# Patient Record
Sex: Female | Born: 1955 | Race: White | Hispanic: No | Smoking: Former smoker
Health system: Southern US, Community
[De-identification: ages and names within clinical notes are randomized; demographics above are authoritative.]

## PROBLEM LIST (undated history)

## (undated) DIAGNOSIS — T7840XA Allergy, unspecified, initial encounter: Secondary | ICD-10-CM

## (undated) DIAGNOSIS — I10 Essential (primary) hypertension: Secondary | ICD-10-CM

## (undated) HISTORY — PX: DENTAL SURGERY: SHX609

## (undated) HISTORY — PX: TUBAL LIGATION: SHX77

## (undated) HISTORY — PX: ECTOPIC PREGNANCY SURGERY: SHX613

## (undated) HISTORY — DX: Essential (primary) hypertension: I10

## (undated) HISTORY — PX: TONSILLECTOMY AND ADENOIDECTOMY: SUR1326

## (undated) HISTORY — DX: Allergy, unspecified, initial encounter: T78.40XA

---
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
## 2011-08-24 ENCOUNTER — Encounter: Payer: Self-pay | Admitting: Family

## 2011-08-24 ENCOUNTER — Ambulatory Visit (INDEPENDENT_AMBULATORY_CARE_PROVIDER_SITE_OTHER): Payer: BC Managed Care – PPO | Admitting: Family

## 2011-08-24 VITALS — BP 150/96 | Ht 65.0 in | Wt 187.0 lb

## 2011-08-24 DIAGNOSIS — I10 Essential (primary) hypertension: Secondary | ICD-10-CM

## 2011-08-24 DIAGNOSIS — J309 Allergic rhinitis, unspecified: Secondary | ICD-10-CM

## 2011-08-24 DIAGNOSIS — G43909 Migraine, unspecified, not intractable, without status migrainosus: Secondary | ICD-10-CM

## 2011-08-24 MED ORDER — OLMESARTAN MEDOXOMIL-HCTZ 20-12.5 MG PO TABS: 1.0000 | ORAL_TABLET | Freq: Every day | ORAL | Status: DC

## 2011-08-24 MED ORDER — TOPIRAMATE 100 MG PO TABS: 100.0000 mg | ORAL_TABLET | Freq: Two times a day (BID) | ORAL | Status: DC

## 2011-08-24 NOTE — Progress Notes (Signed)
Subjective:    Patient ID: Peggy Church, female    DOB: 1955-11-04, 56 y.o.   MRN: [id number]  HPI Comments: 56 yo white female patient to the practice his Antabuse tablets. She has a history of hypertension, migraine headaches, and allergic rhinitis. Peggy Church stable on her current medications. She has not seen a primary care provider in about 2 years. Last complete physical exam, 2 years ago. She is overdue for her Pap smear as well. She denies any lightheadedness, dizziness, chest pain, palpitations, shortness of breath or edema.     Review of Systems  Constitutional: Negative.   HENT: Negative.   Eyes: Negative.   Respiratory: Negative.   Cardiovascular: Negative.   Gastrointestinal: Negative.   Genitourinary: Negative.   Musculoskeletal: Negative.   Skin: Negative.   Neurological: Negative.   Psychiatric/Behavioral: Negative.        Past Medical History  Diagnosis Date  . Hypertension   . Allergy   . Migraine     History   Social History  . Marital Status: Divorced    Spouse Name: N/A    Number of Children: N/A  . Years of Education: N/A   Occupational History  . Not on file.   Social History Main Topics  . Smoking status: Former Games developer  . Smokeless tobacco: Not on file  . Alcohol Use: Yes     moderately  . Drug Use: No  . Sexually Active: Not on file   Other Topics Concern  . Not on file   Social History Narrative  . No narrative on file    Past Surgical History  Procedure Date  . Tubal ligation   . Ectopic pregnancy surgery   . Tonsillectomy and adenoidectomy   . Bladder surgery 1963    stretched    Family History  Problem Relation Age of Onset  . Hyperlipidemia Mother   . Hypertension Mother   . Cancer Father     prostate  . Hyperlipidemia Father   . Hypertension Father     Allergies  Allergen Reactions  . Sulfa Drugs Cross Reactors     Current Outpatient Prescriptions on File Prior to Visit  Medication Sig Dispense Refill  .  fluticasone (FLONASE) 50 MCG/ACT nasal spray Place 2 sprays into the nose daily.      Marland Kitchen olmesartan-hydrochlorothiazide (BENICAR HCT) 20-12.5 MG per tablet Take 1 tablet by mouth daily.  30 tablet  5  . topiramate (TOPAMAX) 100 MG tablet Take 1 tablet (100 mg total) by mouth 2 (two) times daily.  60 tablet  5    BP 150/96  Ht 5\' 5"  (1.651 m)  Wt 187 lb (84.823 kg)  BMI 31.12 kg/m2chart Objective:   Physical Exam  Constitutional: She is oriented to person, place, and time. She appears well-developed and well-nourished. No distress.  HENT:  Right Ear: External ear normal.  Left Ear: External ear normal.  Mouth/Throat: Oropharynx is clear and moist. No oropharyngeal exudate.  Eyes: Right eye exhibits no discharge. Left eye exhibits no discharge.  Neck: Normal range of motion. No JVD present. No tracheal deviation present. No thyromegaly present.  Cardiovascular: Normal rate, regular rhythm, normal heart sounds and intact distal pulses.  Exam reveals no gallop and no friction rub.   No murmur heard. Pulmonary/Chest: Effort normal and breath sounds normal. No stridor. No respiratory distress. She has no wheezes. She has no rales. She exhibits no tenderness.  Abdominal: Soft. Bowel sounds are normal. She exhibits no distension and no mass. There  is no tenderness. There is no rebound and no guarding.  Musculoskeletal: Normal range of motion. She exhibits no edema and no tenderness.  Lymphadenopathy:    She has no cervical adenopathy.  Neurological: She is alert and oriented to person, place, and time.  Skin: Skin is warm and dry. She is not diaphoretic.  Psychiatric: She has a normal mood and affect.          Assessment & Plan:  Assessment: HTN, Migraine,  Allergic Rhinitis  Plan: Flonase, benicar, topamax renewed. RTC for CPX, pt has not been fasting. Teaching handouts on diagnosis and treatments provided. Healthy diet and exercise.

## 2011-08-30 ENCOUNTER — Ambulatory Visit (INDEPENDENT_AMBULATORY_CARE_PROVIDER_SITE_OTHER): Payer: BC Managed Care – PPO | Admitting: Family

## 2011-08-30 DIAGNOSIS — Z09 Encounter for follow-up examination after completed treatment for conditions other than malignant neoplasm: Secondary | ICD-10-CM

## 2011-08-30 NOTE — Progress Notes (Signed)
  Subjective:    Patient ID: Peggy Church, female    DOB: Mar 19, 1956, 56 y.o.   MRN: [id number]  HPI    Review of Systems     Objective:   Physical Exam        Assessment & Plan:

## 2011-08-31 ENCOUNTER — Encounter: Payer: Self-pay | Admitting: Family

## 2011-08-31 ENCOUNTER — Other Ambulatory Visit (HOSPITAL_COMMUNITY)
Admission: RE | Admit: 2011-08-31 | Discharge: 2011-08-31 | Disposition: A | Discharge status: Home / Self Care | Payer: BC Managed Care – PPO | Source: Ambulatory Visit | Attending: Family | Admitting: Family

## 2011-08-31 ENCOUNTER — Ambulatory Visit (INDEPENDENT_AMBULATORY_CARE_PROVIDER_SITE_OTHER): Payer: BC Managed Care – PPO | Admitting: Family

## 2011-08-31 VITALS — BP 124/88 | Temp 98.5°F | Wt 184.0 lb

## 2011-08-31 DIAGNOSIS — Z01419 Encounter for gynecological examination (general) (routine) without abnormal findings: Secondary | ICD-10-CM | POA: Insufficient documentation

## 2011-08-31 DIAGNOSIS — D126 Benign neoplasm of colon, unspecified: Secondary | ICD-10-CM

## 2011-08-31 DIAGNOSIS — K635 Polyp of colon: Secondary | ICD-10-CM

## 2011-08-31 DIAGNOSIS — I10 Essential (primary) hypertension: Secondary | ICD-10-CM

## 2011-08-31 DIAGNOSIS — Z1231 Encounter for screening mammogram for malignant neoplasm of breast: Secondary | ICD-10-CM

## 2011-08-31 DIAGNOSIS — Z124 Encounter for screening for malignant neoplasm of cervix: Secondary | ICD-10-CM

## 2011-08-31 DIAGNOSIS — Z0000 Encounter for general adult medical examination without abnormal findings: Secondary | ICD-10-CM

## 2011-08-31 LAB — LIPID PANEL
Cholesterol: 327 mg/dL — ABNORMAL HIGH (ref 0–200)
Triglycerides: 436 mg/dL — ABNORMAL HIGH (ref 0.0–149.0)

## 2011-08-31 LAB — BASIC METABOLIC PANEL
CO2: 24 mEq/L (ref 19–32)
Calcium: 9.8 mg/dL (ref 8.4–10.5)
Creatinine, Ser: 1 mg/dL (ref 0.4–1.2)
Glucose, Bld: 91 mg/dL (ref 70–99)

## 2011-08-31 LAB — LDL CHOLESTEROL, DIRECT: Direct LDL: 209.1 mg/dL

## 2011-08-31 LAB — CBC
MCHC: 34.2 g/dL (ref 30.0–36.0)
MCV: 88.2 fl (ref 78.0–100.0)

## 2011-08-31 NOTE — Progress Notes (Signed)
Subjective:    Patient ID: Peggy Church, female    DOB: 1955-07-26, 56 y.o.   MRN: [id number]  HPI Comments: Single 56 yo white female presents for CPX. Stating was started on Benicar one week ago for HTN. Bp 124/88. Asymptomatic, denies cp or shortness of breath. Nonsmoker. Denies ETOH use. Is not sexually active. Does not exercise. Has been fasting for labs. Mammogram 5 years ago for biopsy which was negative. Had colonoscopy 6 years ago for colon polyps. Denies any problems with bowels, abdominal discomfort, or GERD. Has experienced bladder incontinence since age 26. Denies urinary frequency, urgency, dysuria, nausea, or urine odor. C/o 'mild" migraine headache started couple days ago, rates as 4/10 described as throbbing. Took "topiramate" with no relief. Denies headache pain increasing with movement. Mild photophobia.      Review of Systems  Constitutional: Negative.   Eyes: Negative.   Respiratory: Negative.   Cardiovascular: Negative.   Gastrointestinal: Negative.   Genitourinary: Negative.   Musculoskeletal: Negative.   Skin: Negative.   Neurological: Positive for headaches. Negative for dizziness, tremors, seizures, syncope, facial asymmetry, speech difficulty, weakness, light-headedness and numbness.  Hematological: Negative.   Psychiatric/Behavioral: Negative.    Past Medical History  Diagnosis Date  . Hypertension   . Allergy   . Migraine     History   Social History  . Marital Status: Divorced    Spouse Name: N/A    Number of Children: N/A  . Years of Education: N/A   Occupational History  . Not on file.   Social History Main Topics  . Smoking status: Former Games developer  . Smokeless tobacco: Not on file  . Alcohol Use: Yes     moderately  . Drug Use: No  . Sexually Active: Not on file   Other Topics Concern  . Not on file   Social History Narrative  . No narrative on file    Past Surgical History  Procedure Date  . Tubal ligation   . Ectopic  pregnancy surgery   . Tonsillectomy and adenoidectomy   . Bladder surgery 1963    stretched    Family History  Problem Relation Age of Onset  . Hyperlipidemia Mother   . Hypertension Mother   . Cancer Father     prostate  . Hyperlipidemia Father   . Hypertension Father     Allergies  Allergen Reactions  . Sulfa Drugs Cross Reactors     Current Outpatient Prescriptions on File Prior to Visit  Medication Sig Dispense Refill  . fluticasone (FLONASE) 50 MCG/ACT nasal spray Place 2 sprays into the nose daily.      Marland Kitchen olmesartan-hydrochlorothiazide (BENICAR HCT) 20-12.5 MG per tablet Take 1 tablet by mouth daily.  30 tablet  5  . topiramate (TOPAMAX) 100 MG tablet Take 1 tablet (100 mg total) by mouth 2 (two) times daily.  60 tablet  5    BP 124/88  Temp(Src) 98.5 F (36.9 C) (Oral)  Wt 184 lb (83.462 kg)chart    Objective:   Physical Exam  Constitutional: She is oriented to person, place, and time. She appears well-developed and well-nourished. No distress.  HENT:  Left Ear: External ear normal.  Nose: Nose normal.  Mouth/Throat: Oropharynx is clear and moist.  Eyes: Pupils are equal, round, and reactive to light. Right eye exhibits no discharge. Left eye exhibits no discharge.  Neck: Normal range of motion. Neck supple. No JVD present. No tracheal deviation present. No thyromegaly present.  Cardiovascular: Normal rate, regular rhythm,  normal heart sounds and intact distal pulses.  Exam reveals no gallop and no friction rub.   No murmur heard. Pulmonary/Chest: Effort normal and breath sounds normal. No stridor. No respiratory distress. She has no wheezes. She has no rales. She exhibits no tenderness.  Lymphadenopathy:    She has no cervical adenopathy.  Neurological: She is alert and oriented to person, place, and time.  Skin: Skin is warm and dry. No rash noted. She is not diaphoretic. No erythema. No pallor.  Psychiatric: She has a normal mood and affect. Her behavior is  normal.          Assessment & Plan:  Assessment: Migraine, CPE, Pap Smear  Plan: Labs: CBC, BMP, TSH. RTC as needed. Teaching handouts provided on diagnosis and treatments. Opportunity for questions provided.Mammogram. Colonoscopy.

## 2011-08-31 NOTE — Patient Instructions (Signed)
Breast Self-Exam A self breast exam may help you find changes or problems while they are still small. Do a breast self-exam:  Every month.   One week after your period (menstrual period).   On the first day of each month if you do not have periods anymore.  Look for any:  Change in breast color, size, or shape.   Dimples in your breast.   Changes in your nipples or skin.   Dry skin on your breasts or nipples.   Watery or bloody discharge from your nipples.   Feel for:  Lumps.   Thick, hard places.   Any other changes.  HOME CARE There are 3 ways to do the breast self-exam: In front of a mirror.  Lift your arms over your head and turn side to side.   Put your hands on your hips and lean down, then turn from side to side.   Bend forward and turn from side to side.  In the shower.  With soapy hands, check both breasts. Then check above and below your collarbone and your armpits.   Feel above and below your collarbone down to under your breast, and from the center of your chest to the outer edge of the armpit. Check for any lumps or hard spots.   Using the tips of your middle three fingers check your whole breast by pressing your hand over your breast in a circle or in an up and down motion.  Lying down.  Lie flat on your bed.   Put a small pillow under the breast you are going to check. On that same side, put your hand behind your head.   With your other hand, use the 3 middle fingers to feel the breast.   Move your fingers in a circle around the breast. Press firmly over all parts of the breast to feel for any lumps.  GET HELP RIGHT AWAY IF: You find any changes in your breasts so they can be checked. Document Released: 10/12/2007 Document Revised: 04/14/2011 Document Reviewed: 08/13/2008 Exit[employer] Patient Information 2012 [employer].   Exercise to Stay Healthy Exercise helps you become and stay healthy. EXERCISE IDEAS AND TIPS Choose exercises  that:  You enjoy.   Fit into your day.  You do not need to exercise really hard to be healthy. You can do exercises at a slow or medium level and stay healthy. You can:  Stretch before and after working out.   Try yoga, Pilates, or tai chi.   Lift weights.   Walk fast, swim, jog, run, climb stairs, bicycle, dance, or rollerskate.   Take aerobic classes.  Exercises that burn about 150 calories:  Running 1  miles in 15 minutes.   Playing volleyball for 45 to 60 minutes.   Washing and waxing a car for 45 to 60 minutes.   Playing touch football for 45 minutes.   Walking 1  miles in 35 minutes.   Pushing a stroller 1  miles in 30 minutes.   Playing basketball for 30 minutes.   Raking leaves for 30 minutes.   Bicycling 5 miles in 30 minutes.   Walking 2 miles in 30 minutes.   Dancing for 30 minutes.   Shoveling snow for 15 minutes.   Swimming laps for 20 minutes.   Walking up stairs for 15 minutes.   Bicycling 4 miles in 15 minutes.   Gardening for 30 to 45 minutes.   Jumping rope for 15 minutes.   Washing windows   or floors for 45 to 60 minutes.  Document Released: 05/28/2010 Document Revised: 04/14/2011 Document Reviewed: 05/28/2010 [employer] Patient Information 2012 [employer]. 

## 2011-09-01 ENCOUNTER — Telehealth: Payer: Self-pay | Admitting: Family

## 2011-09-01 MED ORDER — CHOLINE FENOFIBRATE 135 MG PO CPDR: 135.0000 mg | DELAYED_RELEASE_CAPSULE | Freq: Every day | ORAL | Status: DC

## 2011-09-01 NOTE — Telephone Encounter (Signed)
Message copied by Beverely Low on Thu Sep 01, 2011  1:57 PM ------      Message from: Peggy Church      Created: Wed Aug 31, 2011  4:10 PM       Be sure patient was fasting... If so, her triglycerides are significantly elevated and will require medication.

## 2011-09-01 NOTE — Telephone Encounter (Signed)
Pt called req to get lab results. Pls call asap.  °

## 2011-09-01 NOTE — Telephone Encounter (Signed)
Pt aware and states that she know about the elevated cholesterol and trigs. She does not want to take a statin, stating that she has taken a few different types in the past and they have destroyed her muscles. Pt advises that she has changed her diet and tries exercise but her cholesterol remains high.  Per Oran Rein, medication for triglycerides does not cause muscle aches and pains but if pt still refuses medication she is at risk for pancreatitis and other complications.     Pt aware and states that she will try the medication. Rx sent and Results mailed to pt

## 2011-09-12 ENCOUNTER — Encounter: Payer: Self-pay | Admitting: Family

## 2011-09-12 ENCOUNTER — Ambulatory Visit (INDEPENDENT_AMBULATORY_CARE_PROVIDER_SITE_OTHER): Payer: BC Managed Care – PPO | Admitting: Family

## 2011-09-12 VITALS — BP 122/80 | Temp 98.4°F | Wt 184.0 lb

## 2011-09-12 DIAGNOSIS — I10 Essential (primary) hypertension: Secondary | ICD-10-CM

## 2011-09-12 DIAGNOSIS — T7840XA Allergy, unspecified, initial encounter: Secondary | ICD-10-CM

## 2011-09-12 DIAGNOSIS — Z888 Allergy status to other drugs, medicaments and biological substances status: Secondary | ICD-10-CM

## 2011-09-12 MED ORDER — OLMESARTAN MEDOXOMIL 20 MG PO TABS: 20.0000 mg | ORAL_TABLET | Freq: Every day | ORAL | Status: DC

## 2011-09-12 NOTE — Progress Notes (Signed)
Subjective:    Patient ID: Peggy Church, female    DOB: [birth year], 56 y.o.   MRN: [id number]  HPI 56 year old white female, nonsmoker is in today with complaints of itchiness inside of her mouth, no takes x3 weeks. She believes it may be a side effect of her Benicar HCT. She's been on Benicar HCT for about 3 weeks. She has previously been on lisinopril which caused a cough. She's also tried Norvasc and is unsure of what her reaction was. She denies any lightheadedness, dizziness, chest pain, palpitations, shortness of breath or edema.   Review of Systems  Constitutional: Negative.   HENT: Positive for sore throat.        No taste  Respiratory: Negative.   Cardiovascular: Negative.   Musculoskeletal: Negative.   Skin: Negative.   Neurological: Negative.   Hematological: Negative.   Psychiatric/Behavioral: Negative.    Past Medical History  Diagnosis Date  . Hypertension   . Allergy   . Migraine     History   Social History  . Marital Status: Divorced    Spouse Name: N/A    Number of Children: N/A  . Years of Education: N/A   Occupational History  . Not on file.   Social History Main Topics  . Smoking status: Former Games developer  . Smokeless tobacco: Not on file  . Alcohol Use: Yes     moderately  . Drug Use: No  . Sexually Active: Not on file   Other Topics Concern  . Not on file   Social History Narrative  . No narrative on file    Past Surgical History  Procedure Date  . Tubal ligation   . Ectopic pregnancy surgery   . Tonsillectomy and adenoidectomy   . Bladder surgery 1963    stretched    Family History  Problem Relation Age of Onset  . Hyperlipidemia Mother   . Hypertension Mother   . Cancer Father     prostate  . Hyperlipidemia Father   . Hypertension Father     Allergies  Allergen Reactions  . Sulfa Drugs Cross Reactors     Current Outpatient Prescriptions on File Prior to Visit  Medication Sig Dispense Refill  . Choline Fenofibrate  (TRILIPIX) 135 MG capsule Take 1 capsule (135 mg total) by mouth daily.  30 capsule  1  . fluticasone (FLONASE) 50 MCG/ACT nasal spray Place 2 sprays into the nose daily.      Marland Kitchen olmesartan-hydrochlorothiazide (BENICAR HCT) 20-12.5 MG per tablet Take 1 tablet by mouth daily.  30 tablet  5  . topiramate (TOPAMAX) 100 MG tablet Take 1 tablet (100 mg total) by mouth 2 (two) times daily.  60 tablet  5    BP 122/80  Temp(Src) 98.4 F (36.9 C) (Oral)  Wt 184 lb (83.462 kg)chart    Objective:   Physical Exam  Constitutional: She is oriented to person, place, and time. She appears well-developed and well-nourished.  HENT:  Right Ear: External ear normal.  Left Ear: External ear normal.  Nose: Nose normal.  Mouth/Throat: Oropharynx is clear and moist.  Cardiovascular: Normal rate, regular rhythm and normal heart sounds.   Pulmonary/Chest: Effort normal and breath sounds normal.  Neurological: She is alert and oriented to person, place, and time.  Skin: Skin is warm and dry.  Psychiatric: She has a normal mood and affect.          Assessment & Plan:  Assessment: Hypertension, allergic reaction to medication  Plan: Since she has  a sulfa allergy, DC Benicar HCT. And start Benicar 20 mg once a day. I believe she is allergic to the HCTZ. Will recheck patient in 2 weeks. Call the office if symptoms worsen or persist. Recheck as scheduled and when necessary sooner.

## 2012-01-17 ENCOUNTER — Ambulatory Visit (INDEPENDENT_AMBULATORY_CARE_PROVIDER_SITE_OTHER): Payer: BC Managed Care – PPO | Admitting: Family Medicine

## 2012-01-17 VITALS — BP 145/87 | HR 65 | Temp 98.5°F | Resp 16 | Ht 65.0 in | Wt 185.0 lb

## 2012-01-17 DIAGNOSIS — Z025 Encounter for examination for participation in sport: Secondary | ICD-10-CM

## 2012-01-17 DIAGNOSIS — Z0289 Encounter for other administrative examinations: Secondary | ICD-10-CM

## 2012-01-17 NOTE — Progress Notes (Signed)
Patient ID: Peggy Church, female   DOB: 1955/11/13, 56 y.o.   MRN: [id number] Peggy Church is a 56 y.o. female who presents to Urgent Care today for work physical:  1.  Work Physical:  No significant past medical history.  Does have history of recurrent UTI's but none recently.  Doing well currently.    PMH reviewed.  ROS as above otherwise neg.  No chest pain, palpitations, SOB, Fever, Chills, Abd pain, N/V/D.  Medications reviewed. Current Outpatient Prescriptions  Medication Sig Dispense Refill  . topiramate (TOPAMAX) 100 MG tablet Take 100 mg by mouth as needed.      Marland Kitchen DISCONTD: topiramate (TOPAMAX) 100 MG tablet Take 1 tablet (100 mg total) by mouth 2 (two) times daily.  60 tablet  5    Exam:  BP 145/87  Pulse 65  Temp 98.5 F (36.9 C)  Resp 16  Ht 5\' 5"  (1.651 m)  Wt 185 lb (83.915 kg)  BMI 30.79 kg/m2 Gen: Well NAD HEENT:  [place]/AT.  EOMI, PERRL.  MMM, tonsils non-erythematous, non-edematous.  External ears WNL, Bilateral TM's normal without retraction, redness or bulging.  Pulm:  Clear to auscultation bilaterally with good air movement.  No wheezes or rales noted.   Cardiac:  Regular rate and rhythm without murmur auscultated.  Good S1/S2. Abd: NABS, NT, ND Exts: Non edematous BL  LE, warm and well perfused.  Neuro:  Alert and oriented x 3.    Assessment and Plan:  1.  Sports physical:  Normal appearing female with normal physical examination.  Vision and hearing good.  PPD negative July 17 2011.  Signed form and given to patient.

## 2012-02-11 NOTE — Progress Notes (Signed)
°  Subjective:    Patient ID: Peggy Church, female    DOB: 1956-03-31, 56 y.o.   MRN: [id number]  HPI    Review of Systems     Objective:   Physical Exam        Assessment & Plan:  appt cancelled

## 2012-05-28 ENCOUNTER — Ambulatory Visit (INDEPENDENT_AMBULATORY_CARE_PROVIDER_SITE_OTHER): Payer: BC Managed Care – PPO | Admitting: Family Medicine

## 2012-05-28 VITALS — BP 170/102 | HR 70 | Temp 98.2°F | Resp 16 | Ht 65.0 in | Wt 184.0 lb

## 2012-05-28 DIAGNOSIS — H539 Unspecified visual disturbance: Secondary | ICD-10-CM

## 2012-05-28 DIAGNOSIS — R51 Headache: Secondary | ICD-10-CM

## 2012-05-28 DIAGNOSIS — I10 Essential (primary) hypertension: Secondary | ICD-10-CM

## 2012-05-28 LAB — BASIC METABOLIC PANEL WITH GFR
BUN: 14 mg/dL (ref 6–23)
Calcium: 9.9 mg/dL (ref 8.4–10.5)
Glucose, Bld: 93 mg/dL (ref 70–99)
Sodium: 139 meq/L (ref 135–145)

## 2012-05-28 LAB — BASIC METABOLIC PANEL
CO2: 22 mEq/L (ref 19–32)
Chloride: 107 mEq/L (ref 96–112)
Creat: 1.04 mg/dL (ref 0.50–1.10)
Potassium: 4.1 mEq/L (ref 3.5–5.3)

## 2012-05-28 MED ORDER — AMLODIPINE BESYLATE 5 MG PO TABS: 5.0000 mg | ORAL_TABLET | Freq: Every day | ORAL | Status: DC

## 2012-05-28 MED ORDER — HYDROCHLOROTHIAZIDE 12.5 MG PO CAPS: 12.5000 mg | ORAL_CAPSULE | Freq: Every day | ORAL | Status: DC

## 2012-05-28 NOTE — Progress Notes (Signed)
[facility]:  Office Visit  Chief Complaint:  Chief Complaint  Patient presents with  . Hypertension    high blood pressure x 2 week  . Headache    HPI: Peggy Church is a 57 y.o. female who complains of 2 week h/o elevated BP. Was on Benicar but had SEs ie wierd smells. Can't ake Linsopril due to cough. Lost 80 lbs during messy divorce and personal death, but has gained 25 lbs hence the BP has gone up. Nonsmoker. She has diffuse frontal HA but denies gait changes, confusion, slurred speech or Cp/SOB, palpitations, nausea, vomiting. She is not sure if she has decrease vision, denies double vision  Past Medical History  Diagnosis Date  . Hypertension   . Allergy   . Migraine    Past Surgical History  Procedure Date  . Tubal ligation   . Ectopic pregnancy surgery   . Tonsillectomy and adenoidectomy   . Bladder surgery 1963    stretched   History   Social History  . Marital Status: Divorced    Spouse Name: N/A    Number of Children: N/A  . Years of Education: N/A   Social History Main Topics  . Smoking status: Former Games developer  . Smokeless tobacco: None  . Alcohol Use: Yes     Comment: moderately  . Drug Use: No  . Sexually Active: None   Other Topics Concern  . None   Social History Narrative  . None   Family History  Problem Relation Age of Onset  . Hyperlipidemia Mother   . Hypertension Mother   . Cancer Father     prostate  . Hyperlipidemia Father   . Hypertension Father    Allergies  Allergen Reactions  . Lisinopril     cough  . Sulfa Drugs Cross Reactors    Prior to Admission medications   Medication Sig Start Date End Date Taking? Authorizing Provider  topiramate (TOPAMAX) 100 MG tablet Take 100 mg by mouth as needed. 08/24/11  Yes Baker Pierini, FNP     ROS: The patient denies fevers, chills, night sweats, unintentional weight loss, chest pain, palpitations, wheezing, dyspnea on exertion, nausea, vomiting, abdominal  pain, dysuria, hematuria, melena, numbness, weakness, or tingling.   All other systems have been reviewed and were otherwise negative with the exception of those mentioned in the HPI and as above.    PHYSICAL EXAM: Filed Vitals:   05/28/12 1553  BP: 170/102  Pulse: 70  Temp: 98.2 F (36.8 C)  Resp: 16   Filed Vitals:   05/28/12 1553  Height: 5\' 5"  (1.651 m)  Weight: 184 lb (83.462 kg)   Body mass index is 30.62 kg/(m^2).  General: Alert, no acute distress HEENT:  Normocephalic, atraumatic, oropharynx patent. EOMI, PERRLA, fundoscopic exam nl Cardiovascular:  Regular rate and rhythm, no rubs murmurs or gallops.  No Carotid bruits, radial pulse intact. No pedal edema.  Respiratory: Clear to auscultation bilaterally.  No wheezes, rales, or rhonchi.  No cyanosis, no use of accessory musculature GI: No organomegaly, abdomen is soft and non-tender, positive bowel sounds.  No masses. Skin: No rashes. Neurologic: Facial musculature symmetric. Psychiatric: Patient is appropriate throughout our interaction. Lymphatic: No cervical lymphadenopathy Musculoskeletal: Gait intact.   LABS:    EKG/XRAY:   Primary read interpreted by Dr. Conley Rolls at [facility].   ASSESSMENT/PLAN: Encounter Diagnoses  Name Primary?  . HTN (hypertension) Yes  . Headache    Rx Norvasc 5 mg and HCTZ 12.5  mg daily Will increase prn in 2 weeks, patient will call me with BP results and we will adjust accordingly IF BP greater than 200/100 after taking Norvasc and HCTZ she will call me in the AM. Call me in AM if HA still continues Will f/u with BP logs in 4 weeks or sooner prn DASH diet, BMP pending    LE, THAO PHUONG, DO 05/28/2012 4:21 PM

## 2012-05-28 NOTE — Patient Instructions (Addendum)
DASH Diet The DASH diet stands for "Dietary Approaches to Stop Hypertension." It is a healthy eating plan that has been shown to reduce high blood pressure (hypertension) in as little as 14 days, while also possibly providing other significant health benefits. These other health benefits include reducing the risk of breast cancer after menopause and reducing the risk of type 2 diabetes, heart disease, colon cancer, and stroke. Health benefits also include weight loss and slowing kidney failure in patients with chronic kidney disease.  DIET GUIDELINES  Limit salt (sodium). Your diet should contain less than 1500 mg of sodium daily.  Limit refined or processed carbohydrates. Your diet should include mostly whole grains. Desserts and added sugars should be used sparingly.  Include small amounts of heart-healthy fats. These types of fats include nuts, oils, and tub margarine. Limit saturated and trans fats. These fats have been shown to be harmful in the body. CHOOSING FOODS  The following food groups are based on a 2000 calorie diet. See your Registered Dietitian for individual calorie needs. Grains and Grain Products (6 to 8 servings daily)  Eat More Often: Whole-wheat bread, brown rice, whole-grain or wheat pasta, quinoa, popcorn without added fat or salt (air popped).  Eat Less Often: White bread, white pasta, white rice, cornbread. Vegetables (4 to 5 servings daily)  Eat More Often: Fresh, frozen, and canned vegetables. Vegetables may be raw, steamed, roasted, or grilled with a minimal amount of fat.  Eat Less Often/Avoid: Creamed or fried vegetables. Vegetables in a cheese sauce. Fruit (4 to 5 servings daily)  Eat More Often: All fresh, canned (in natural juice), or frozen fruits. Dried fruits without added sugar. One hundred percent fruit juice ( cup [237 mL] daily).  Eat Less Often: Dried fruits with added sugar. Canned fruit in light or heavy syrup. [employer], Fish, and Poultry (2  servings or less daily. One serving is 3 to 4 oz [85-114 g]).  Eat More Often: Ninety percent or leaner ground beef, tenderloin, sirloin. Round cuts of beef, chicken breast, [place] breast. All fish. Grill, bake, or broil your meat. Nothing should be fried.  Eat Less Often/Avoid: Fatty cuts of meat, [place], or chicken leg, thigh, or wing. Fried cuts of meat or fish. Dairy (2 to 3 servings)  Eat More Often: Low-fat or fat-free milk, low-fat plain or light yogurt, reduced-fat or part-skim cheese.  Eat Less Often/Avoid: Milk (whole, 2%).Whole milk yogurt. Full-fat cheeses. Nuts, Seeds, and Legumes (4 to 5 servings per week)  Eat More Often: All without added salt.  Eat Less Often/Avoid: Salted nuts and seeds, canned beans with added salt. Fats and Sweets (limited)  Eat More Often: Vegetable oils, tub margarines without trans fats, sugar-free gelatin. Mayonnaise and salad dressings.  Eat Less Often/Avoid: Coconut oils, palm oils, butter, stick margarine, cream, half and half, cookies, candy, pie. FOR MORE INFORMATION The Dash Diet Eating Plan: www.dashdiet.org Document Released: 04/14/2011 Document Revised: 07/18/2011 Document Reviewed: 04/14/2011 [facility] Patient Information 2013 [place], [place]. Hypertension As your heart beats, it forces blood through your arteries. This force is your blood pressure. If the pressure is too high, it is called hypertension (HTN) or high blood pressure. HTN is dangerous because you may have it and not know it. High blood pressure may mean that your heart has to work harder to pump blood. Your arteries may be narrow or stiff. The extra work puts you at risk for heart disease, stroke, and other problems.  Blood pressure consists of two numbers, a  higher number over a lower, 110/72, for example. It is stated as "110 over 72." The ideal is below 120 for the top number (systolic) and under 80 for the bottom (diastolic). Write down your blood pressure today. You  should pay close attention to your blood pressure if you have certain conditions such as:  Heart failure.  Prior heart attack.  Diabetes  Chronic kidney disease.  Prior stroke.  Multiple risk factors for heart disease. To see if you have HTN, your blood pressure should be measured while you are seated with your arm held at the level of the heart. It should be measured at least twice. A one-time elevated blood pressure reading (especially in the Emergency Department) does not mean that you need treatment. There may be conditions in which the blood pressure is different between your right and left arms. It is important to see your caregiver soon for a recheck. Most people have essential hypertension which means that there is not a specific cause. This type of high blood pressure may be lowered by changing lifestyle factors such as:  Stress.  Smoking.  Lack of exercise.  Excessive weight.  Drug/tobacco/alcohol use.  Eating less salt. Most people do not have symptoms from high blood pressure until it has caused damage to the body. Effective treatment can often prevent, delay or reduce that damage. TREATMENT  When a cause has been identified, treatment for high blood pressure is directed at the cause. There are a large number of medications to treat HTN. These fall into several categories, and your caregiver will help you select the medicines that are best for you. Medications may have side effects. You should review side effects with your caregiver. If your blood pressure stays high after you have made lifestyle changes or started on medicines,   Your medication(s) may need to be changed.  Other problems may need to be addressed.  Be certain you understand your prescriptions, and know how and when to take your medicine.  Be sure to follow up with your caregiver within the time frame advised (usually within two weeks) to have your blood pressure rechecked and to review your  medications.  If you are taking more than one medicine to lower your blood pressure, make sure you know how and at what times they should be taken. Taking two medicines at the same time can result in blood pressure that is too low. SEEK IMMEDIATE MEDICAL CARE IF:  You develop a severe headache, blurred or changing vision, or confusion.  You have unusual weakness or numbness, or a faint feeling.  You have severe chest or abdominal pain, vomiting, or breathing problems. MAKE SURE YOU:   Understand these instructions.  Will watch your condition.  Will get help right away if you are not doing well or get worse. Document Released: 04/25/2005 Document Revised: 07/18/2011 Document Reviewed: 12/14/2007 [facility] Patient Information 2013 [place], [place]. DASH Diet The DASH diet stands for "Dietary Approaches to Stop Hypertension." It is a healthy eating plan that has been shown to reduce high blood pressure (hypertension) in as little as 14 days, while also possibly providing other significant health benefits. These other health benefits include reducing the risk of breast cancer after menopause and reducing the risk of type 2 diabetes, heart disease, colon cancer, and stroke. Health benefits also include weight loss and slowing kidney failure in patients with chronic kidney disease.  DIET GUIDELINES  Limit salt (sodium). Your diet should contain less than 1500 mg of sodium daily.  Limit refined or processed carbohydrates. Your diet should include mostly whole grains. Desserts and added sugars should be used sparingly.  Include small amounts of heart-healthy fats. These types of fats include nuts, oils, and tub margarine. Limit saturated and trans fats. These fats have been shown to be harmful in the body. CHOOSING FOODS  The following food groups are based on a 2000 calorie diet. See your Registered Dietitian for individual calorie needs. Grains and Grain Products (6 to 8 servings daily)  Eat  More Often: Whole-wheat bread, brown rice, whole-grain or wheat pasta, quinoa, popcorn without added fat or salt (air popped).  Eat Less Often: White bread, white pasta, white rice, cornbread. Vegetables (4 to 5 servings daily)  Eat More Often: Fresh, frozen, and canned vegetables. Vegetables may be raw, steamed, roasted, or grilled with a minimal amount of fat.  Eat Less Often/Avoid: Creamed or fried vegetables. Vegetables in a cheese sauce. Fruit (4 to 5 servings daily)  Eat More Often: All fresh, canned (in natural juice), or frozen fruits. Dried fruits without added sugar. One hundred percent fruit juice ( cup [237 mL] daily).  Eat Less Often: Dried fruits with added sugar. Canned fruit in light or heavy syrup. [employer], Fish, and Poultry (2 servings or less daily. One serving is 3 to 4 oz [85-114 g]).  Eat More Often: Ninety percent or leaner ground beef, tenderloin, sirloin. Round cuts of beef, chicken breast, [place] breast. All fish. Grill, bake, or broil your meat. Nothing should be fried.  Eat Less Often/Avoid: Fatty cuts of meat, [place], or chicken leg, thigh, or wing. Fried cuts of meat or fish. Dairy (2 to 3 servings)  Eat More Often: Low-fat or fat-free milk, low-fat plain or light yogurt, reduced-fat or part-skim cheese.  Eat Less Often/Avoid: Milk (whole, 2%).Whole milk yogurt. Full-fat cheeses. Nuts, Seeds, and Legumes (4 to 5 servings per week)  Eat More Often: All without added salt.  Eat Less Often/Avoid: Salted nuts and seeds, canned beans with added salt. Fats and Sweets (limited)  Eat More Often: Vegetable oils, tub margarines without trans fats, sugar-free gelatin. Mayonnaise and salad dressings.  Eat Less Often/Avoid: Coconut oils, palm oils, butter, stick margarine, cream, half and half, cookies, candy, pie. FOR MORE INFORMATION The Dash Diet Eating Plan: www.dashdiet.org Document Released: 04/14/2011 Document Revised: 07/18/2011 Document Reviewed:  04/14/2011 [facility] Patient Information 2013 [place], [place].

## 2012-05-30 ENCOUNTER — Telehealth: Payer: Self-pay | Admitting: Family Medicine

## 2012-05-30 NOTE — Telephone Encounter (Signed)
Message copied by Honor Loh on Wed May 30, 2012  9:36 AM ------      Message from: Lenell Antu      Created: Tue May 29, 2012  2:15 PM       Call her to let her know kidneys and electrolytes ok. Can you ask her what her BP is as well after starting meds.             Thx             Dr. Conley Rolls

## 2012-05-30 NOTE — Telephone Encounter (Signed)
Left message on machine for patient to return call

## 2012-06-26 ENCOUNTER — Ambulatory Visit (INDEPENDENT_AMBULATORY_CARE_PROVIDER_SITE_OTHER): Payer: BC Managed Care – PPO | Admitting: Family Medicine

## 2012-06-26 VITALS — BP 130/80 | HR 69 | Temp 98.1°F | Resp 14 | Ht 68.0 in | Wt 186.0 lb

## 2012-06-26 DIAGNOSIS — I10 Essential (primary) hypertension: Secondary | ICD-10-CM | POA: Insufficient documentation

## 2012-06-26 MED ORDER — AMLODIPINE BESYLATE 5 MG PO TABS: 5.0000 mg | ORAL_TABLET | Freq: Every day | ORAL | Status: DC

## 2012-06-26 MED ORDER — HYDROCHLOROTHIAZIDE 12.5 MG PO CAPS: 12.5000 mg | ORAL_CAPSULE | Freq: Every day | ORAL | Status: DC

## 2012-06-26 NOTE — Progress Notes (Signed)
[facility]:  Office Visit  Chief Complaint:  Chief Complaint  Patient presents with  . Hypertension    Follow up    HPI: Peggy Church is a 57 y.o. female who complains of  HTN. Doing well. No SEs. Her Has and vision issues have resolved now that her BP is controlled. She does not take her BP at home,  Past Medical History  Diagnosis Date  . Hypertension   . Allergy   . Migraine    Past Surgical History  Procedure Laterality Date  . Tubal ligation    . Ectopic pregnancy surgery    . Tonsillectomy and adenoidectomy    . Bladder surgery  1963    stretched   History   Social History  . Marital Status: Divorced    Spouse Name: N/A    Number of Children: N/A  . Years of Education: N/A   Social History Main Topics  . Smoking status: Former Games developer  . Smokeless tobacco: None  . Alcohol Use: Yes     Comment: moderately  . Drug Use: No  . Sexually Active: None   Other Topics Concern  . None   Social History Narrative  . None   Family History  Problem Relation Age of Onset  . Hyperlipidemia Mother   . Hypertension Mother   . Cancer Father     prostate  . Hyperlipidemia Father   . Hypertension Father    Allergies  Allergen Reactions  . Lisinopril     cough  . Sulfa Drugs Cross Reactors    Prior to Admission medications   Medication Sig Start Date End Date Taking? Authorizing Provider  amLODipine (NORVASC) 5 MG tablet Take 1 tablet (5 mg total) by mouth daily. 05/28/12  Yes Burel Kahre P Martine Bleecker, DO  hydrochlorothiazide (MICROZIDE) 12.5 MG capsule Take 1 capsule (12.5 mg total) by mouth daily. 05/28/12  Yes Jacobs Golab P Tashawnda Bleiler, DO  topiramate (TOPAMAX) 100 MG tablet Take 100 mg by mouth as needed. 08/24/11  Yes Baker Pierini, FNP     ROS: The patient denies fevers, chills, night sweats, unintentional weight loss, chest pain, palpitations, wheezing, dyspnea on exertion, nausea, vomiting, abdominal pain, dysuria, hematuria, melena, numbness, weakness, or  tingling.   All other systems have been reviewed and were otherwise negative with the exception of those mentioned in the HPI and as above.    PHYSICAL EXAM: Filed Vitals:   06/26/12 1946  BP: 130/80  Pulse: 69  Temp: 98.1 F (36.7 C)  Resp: 14   Filed Vitals:   06/26/12 1946  Height: 5\' 8"  (1.727 m)  Weight: 186 lb (84.369 kg)   Body mass index is 28.29 kg/(m^2).  General: Alert, no acute distress HEENT:  Normocephalic, atraumatic, oropharynx patent.  Cardiovascular:  Regular rate and rhythm, no rubs murmurs or gallops.  No Carotid bruits, radial pulse intact. No pedal edema.  Respiratory: Clear to auscultation bilaterally.  No wheezes, rales, or rhonchi.  No cyanosis, no use of accessory musculature GI: No organomegaly, abdomen is soft and non-tender, positive bowel sounds.  No masses. Skin: No rashes. Neurologic: Facial musculature symmetric. Psychiatric: Patient is appropriate throughout our interaction. Lymphatic: No cervical lymphadenopathy Musculoskeletal: Gait intact.   LABS: Results for orders placed in visit on 05/28/12  BASIC METABOLIC PANEL      Result Value Range   Sodium 139  135 - 145 mEq/L   Potassium 4.1  3.5 - 5.3 mEq/L   Chloride 107  96 -  112 mEq/L   CO2 22  19 - 32 mEq/L   Glucose, Bld 93  70 - 99 mg/dL   BUN 14  6 - 23 mg/dL   Creat 1.47  8.29 - 5.62 mg/dL   Calcium 9.9  8.4 - 13.0 mg/dL     EKG/XRAY:   Primary read interpreted by Dr. Conley Rolls at [facility].   ASSESSMENT/PLAN: Encounter Diagnosis  Name Primary?  . HTN (hypertension) Yes   HTN well controlled.  Refilled meds for 6 months F/u in 6 months or prn    Kincaid Tiger PHUONG, DO 06/26/2012 8:38 PM

## 2012-10-17 ENCOUNTER — Ambulatory Visit (INDEPENDENT_AMBULATORY_CARE_PROVIDER_SITE_OTHER): Payer: BC Managed Care – PPO | Admitting: Physician Assistant

## 2012-10-17 VITALS — BP 171/94 | HR 69 | Temp 98.0°F | Resp 16 | Ht 66.0 in | Wt 184.0 lb

## 2012-10-17 DIAGNOSIS — IMO0001 Reserved for inherently not codable concepts without codable children: Secondary | ICD-10-CM

## 2012-10-17 DIAGNOSIS — N9489 Other specified conditions associated with female genital organs and menstrual cycle: Secondary | ICD-10-CM

## 2012-10-17 DIAGNOSIS — N949 Unspecified condition associated with female genital organs and menstrual cycle: Secondary | ICD-10-CM

## 2012-10-17 DIAGNOSIS — I10 Essential (primary) hypertension: Secondary | ICD-10-CM

## 2012-10-17 DIAGNOSIS — R350 Frequency of micturition: Secondary | ICD-10-CM

## 2012-10-17 LAB — POCT URINALYSIS DIPSTICK
Bilirubin, UA: NEGATIVE
Blood, UA: NEGATIVE
Glucose, UA: NEGATIVE
Ketones, UA: NEGATIVE
Leukocytes, UA: NEGATIVE
Nitrite, UA: NEGATIVE
Protein, UA: NEGATIVE
Spec Grav, UA: 1.01
Urobilinogen, UA: 0.2
pH, UA: 5

## 2012-10-17 LAB — POCT UA - MICROSCOPIC ONLY
Bacteria, U Microscopic: NEGATIVE
Casts, Ur, LPF, POC: NEGATIVE
Crystals, Ur, HPF, POC: NEGATIVE
Mucus, UA: NEGATIVE
WBC, Ur, HPF, POC: NEGATIVE
Yeast, UA: NEGATIVE

## 2012-10-17 LAB — POCT WET PREP WITH KOH
Trichomonas, UA: NEGATIVE
Yeast Wet Prep HPF POC: NEGATIVE

## 2012-10-17 MED ORDER — CHLORTHALIDONE 25 MG PO TABS: 25.0000 mg | ORAL_TABLET | Freq: Every day | ORAL | Status: DC

## 2012-10-17 MED ORDER — PHENAZOPYRIDINE HCL 200 MG PO TABS: 200.0000 mg | ORAL_TABLET | Freq: Three times a day (TID) | ORAL | Status: DC | PRN

## 2012-10-17 NOTE — Progress Notes (Signed)
I have examined this patient along with the student and agree.  

## 2012-10-17 NOTE — Patient Instructions (Addendum)
Continue to stay hydrated with at least 64 fl oz per day.  Take medications as directed.  If symptoms worsen or persist please return for follow up.   I will contact you with your lab results as soon as they are available.   If you have not heard from me in 2 weeks, please contact me.  The fastest way to get your results is to register for My Chart (see the instructions on the last page of this printout).

## 2012-10-17 NOTE — Progress Notes (Signed)
  Subjective:    Patient ID: Peggy Church, female    DOB: July 19, 1955, 57 y.o.   MRN: [id number]  HPI  Presents with urinary frequency, urgency and vaginal burning x 1 month. Approximately 1 month ago she was seen at another clinic for UTI and was placed on 7 days of Cipro and an unknown yeast treatment (which she did not use). She felt better by the time the Cipro was done for a few days. Since then she has had a gradual worsening of symptoms. The burning sensation improvement after she used an OTC 1 day yeast treatment. Denies: hematuria, nausea, vomiting, fever, chills.   Additionally states she has not been taking her amlodipine as directed because she feels it makes her gain weight. She reports doing well with the HCTZ.   Review of Systems Denies: headache, dizziness, chest pain, SOB, peripheral edema.     Objective:   Physical Exam  General: WDWN female, appears stated age, no acute distress Resp: clear to auscultation anterior and posterior fields bilaterally, no rales/rhonchi/wheezes  Cardiac: RRR, no murmurs/rubs/gallops  Abdomen: normal bowel sounds, soft, non distended, tender to palpation only in suprapubic region, no CVA tenderness Pelvic: no rashes or lesions, cervix appears normal, mild amount of white discharge, no cervical motion tenderness Neuro: alert & oriented x 3  Skin: no rashes   Results for orders placed in visit on 10/17/12  POCT UA - MICROSCOPIC ONLY      Result Value Range   WBC, Ur, HPF, POC neg     RBC, urine, microscopic 1-2     Bacteria, U Microscopic neg     Mucus, UA neg     Epithelial cells, urine per micros 0-1     Crystals, Ur, HPF, POC neg     Casts, Ur, LPF, POC neg     Yeast, UA neg    POCT URINALYSIS DIPSTICK      Result Value Range   Color, UA yellow     Clarity, UA clear     Glucose, UA neg     Bilirubin, UA neg     Ketones, UA neg     Spec Grav, UA 1.010     Blood, UA neg     pH, UA 5.0     Protein, UA neg     Urobilinogen, UA  0.2     Nitrite, UA neg     Leukocytes, UA Negative    POCT WET PREP WITH KOH      Result Value Range   Trichomonas, UA Negative     Clue Cells Wet Prep HPF POC 0-3     Epithelial Wet Prep HPF POC 2-10     Yeast Wet Prep HPF POC neg     Bacteria Wet Prep HPF POC 4+     RBC Wet Prep HPF POC 0-1     WBC Wet Prep HPF POC 1-2     KOH Prep POC Negative         Assessment & Plan:  Frequency - Plan: POCT UA - Microscopic Only, POCT urinalysis dipstick, Urine culture  Vaginal burning - Plan: POCT Wet Prep with KOH, GC/Chlamydia Probe Amp  Etiology unclear suspect recent UTI s/p treatment and resultant yeast vaginitis also treated.   BP medication: allergy to lisinopril - changed to chlorthalidone, d/c amlodipine d/c HCTZ  Begin pyridium for urinary symptoms  Waiting lab results of gen probe and urine culture

## 2012-10-18 LAB — GC/CHLAMYDIA PROBE AMP: CT Probe RNA: NEGATIVE

## 2012-10-19 LAB — URINE CULTURE: Organism ID, Bacteria: NO GROWTH

## 2012-10-20 ENCOUNTER — Telehealth: Payer: Self-pay

## 2012-10-20 NOTE — Telephone Encounter (Signed)
Pt is returning a call Call back number [phone]

## 2012-11-12 ENCOUNTER — Ambulatory Visit: Payer: BC Managed Care – PPO | Admitting: Family Medicine

## 2012-11-12 VITALS — BP 120/88 | HR 68 | Temp 98.0°F | Resp 16 | Ht 65.5 in | Wt 179.0 lb

## 2012-11-12 DIAGNOSIS — Z0289 Encounter for other administrative examinations: Secondary | ICD-10-CM

## 2012-11-12 NOTE — Progress Notes (Signed)
Commercial Driver Medical Examination   Peggy Church is a 57 y.o. female who presents today for a commercial driver fitness determination physical exam. The patient reports no problems. The following portions of the patient's history were reviewed and updated as appropriate: allergies, current medications, past family history, past medical history, past social history, past surgical history and problem list.  Has well-controlled HTN - only medical problem. She used to be on topamax for migraines but hasn't needed it in a long time.  Review of Systems A comprehensive review of systems was negative.   Objective:    Vision:  Uncorrected Corrected Horizontal Field of Vision  Right Eye 20/70 20/25 70 degrees  Left Eye  20/70 20/25 70 degrees  Both Eyes  20/70 20/25    Applicant can recognize and distinguish among traffic control signals and devices showing standard red, green, and amber colors.  Applicant meets visual acuity requirement only when wearing corrective lenses.  Monocular Vision?: No   Hearing:   500 Hz 1000 Hz 2000 Hz 4000 Hz  Right Ear  NA NA NA NA  Left Ear  NA NA NA NA  Passed whisper test bilaterally at 15 ft.     BP 120/88  Pulse 68  Temp(Src) 98 F (36.7 C)  Resp 16  Ht 5' 5.5" (1.664 m)  Wt 179 lb (81.194 kg)  BMI 29.32 kg/m2  SpO2 100%  General Appearance:    Alert, cooperative, no distress, appears stated age  Head:    Normocephalic, without obvious abnormality, atraumatic  Eyes:    PERRL, conjunctiva/corneas clear, EOM's intact, fundi    benign, both eyes  Ears:    Normal TM's and external ear canals, both ears  Nose:   Nares normal, septum midline, mucosa normal, no drainage    or sinus tenderness  Throat:   Lips, mucosa, and tongue normal; teeth and gums normal  Neck:   Supple, symmetrical, trachea midline, no adenopathy;    thyroid:  no enlargement/tenderness/nodules; no carotid   bruit or JVD  Back:     Symmetric, no curvature, ROM normal,  no CVA tenderness  Lungs:     Clear to auscultation bilaterally, respirations unlabored  Chest Wall:    No tenderness or deformity   Heart:    Regular rate and rhythm, S1 and S2 normal, no murmur, rub   or gallop  Breast Exam:    No tenderness, masses, or nipple abnormality  Abdomen:     Soft, non-tender, bowel sounds active all four quadrants,    no masses, no organomegaly        Extremities:   Extremities normal, atraumatic, no cyanosis or edema  Pulses:   2+ and symmetric all extremities  Skin:   Skin color, texture, turgor normal, no rashes or lesions  Lymph nodes:   Cervical, supraclavicular, and axillary nodes normal  Neurologic:   CNII-XII intact, normal strength, sensation and reflexes    throughout    Labs: Lab Results  Component Value Date   SPECGRAV 1.010 10/17/2012   BILIRUBINUR neg 10/17/2012      Assessment:    Healthy female exam.  Meets standards, but periodic monitoring required due to HTN.  Driver qualified only for 1 year.    Plan:    Medical examiners certificate completed and printed. Return as needed.

## 2012-11-12 NOTE — Progress Notes (Signed)
  Subjective:    Patient ID: Peggy Church, female    DOB: May 18, 1955, 57 y.o.   MRN: [id number]  HPI    Review of Systems     Objective:   Physical Exam        Assessment & Plan:   Urine dip: Sp gravity-1.020 Blood-negative Glucose-negative Protein-negative

## 2012-11-22 ENCOUNTER — Encounter: Payer: Self-pay | Admitting: Family Medicine

## 2013-01-08 ENCOUNTER — Ambulatory Visit (INDEPENDENT_AMBULATORY_CARE_PROVIDER_SITE_OTHER): Payer: BC Managed Care – PPO | Admitting: Family Medicine

## 2013-01-08 VITALS — BP 118/70 | HR 74 | Temp 99.0°F | Resp 18 | Ht 65.0 in | Wt 180.2 lb

## 2013-01-08 DIAGNOSIS — I10 Essential (primary) hypertension: Secondary | ICD-10-CM

## 2013-01-08 MED ORDER — HYDROCHLOROTHIAZIDE 25 MG PO TABS: 12.5000 mg | ORAL_TABLET | Freq: Every day | ORAL | Status: DC

## 2013-01-08 NOTE — Patient Instructions (Signed)
Would be unlikely for HCTZ to cause swelling.  This was likely due to amlodipine in the past. Restart HCTZ at 1/2 pill each day. Keep a record of your blood pressures outside of the office and bring them to the next office visit. If pressures remain above 140/90- can increase to 1 full pill each day. You are overdue for electrolytes/labs, so when you can verify insurance coverage, let me know and I can put in a lab only order for you to have his done. Return to the clinic or go to the nearest emergency room if any of your symptoms worsen or new symptoms occur. Plan on follow up in the next month.

## 2013-01-08 NOTE — Progress Notes (Signed)
  Subjective:    Patient ID: Peggy Church, female    DOB: 1955/07/23, 57 y.o.   MRN: [id number]  HPI  Peggy Church is a 57 y.o. female  HTN  - on chlorthalidone 25mg  qd since June 11th. Causing dry mouth. Tried to quit taking chlorthalidone about 2 weeks ago due to dry mouth, HA and blurry vision at times off meds - blood pressure increased? But did not check blood pressure then, Took blood pressure med every few days.  Feels ok today, but took HCTZ few times earlier this week. Took chlorthalidone yesterday.  Feels well today.   Cough with ACE-I per chart. Phantom smells with Benicar.  Swelling of legs with HCTZ? But was taking norvasc at the same time, and swelling improved   Difficult initial history - clarified timing of medicines a few times.   Review of Systems As above. Feels well now, no chest pain, headache, dizziness or blurry vision.     Objective:   Physical Exam  Vitals reviewed. Constitutional: She is oriented to person, place, and time. She appears well-developed and well-nourished.  HENT:  Head: Normocephalic and atraumatic.  Eyes: Conjunctivae and EOM are normal. Pupils are equal, round, and reactive to light.  Neck: Carotid bruit is not present.  Cardiovascular: Normal rate, regular rhythm, normal heart sounds and intact distal pulses.   Pulmonary/Chest: Effort normal and breath sounds normal.  Abdominal: Soft. She exhibits no pulsatile midline mass. There is no tenderness.  Neurological: She is alert and oriented to person, place, and time.  Skin: Skin is warm and dry.  Psychiatric: She has a normal mood and affect. Her behavior is normal.   No appreciable LE edema noted on exam.      Assessment & Plan:  Peggy Church is a 57 y.o. female HTN (hypertension) - Plan: hydrochlorothiazide (HYDRODIURIL) 25 MG tablet  Prior intolerance or allergy to various medicines, but doubt HCTZ causing edema, as more likely CCB effect.  Will restart HCTZ at 12.5mg  QD, then  can increase to 25mg  each day. Avoid salt containing foods, and rtc precautions discussed or call if persistent edema returns.    Discussed need for BMP.  Patient declined in office - wants to check insurance coverage first.  Can do lab only order when she finds this info, but if any cramps, or new symptoms - needs electrolyte levels.  Plan to recheck in next month.    Meds ordered this encounter  Medications  . hydrochlorothiazide (HYDRODIURIL) 25 MG tablet    Sig: Take 0.5 tablets (12.5 mg total) by mouth daily.    Dispense:  30 tablet    Refill:  0   Patient Instructions  Would be unlikely for HCTZ to cause swelling.  This was likely due to amlodipine in the past. Restart HCTZ at 1/2 pill each day. Keep a record of your blood pressures outside of the office and bring them to the next office visit. If pressures remain above 140/90- can increase to 1 full pill each day. You are overdue for electrolytes/labs, so when you can verify insurance coverage, let me know and I can put in a lab only order for you to have his done. Return to the clinic or go to the nearest emergency room if any of your symptoms worsen or new symptoms occur. Plan on follow up in the next month.

## 2013-02-13 ENCOUNTER — Ambulatory Visit (INDEPENDENT_AMBULATORY_CARE_PROVIDER_SITE_OTHER): Payer: BC Managed Care – PPO | Admitting: Family Medicine

## 2013-02-13 VITALS — BP 142/86 | HR 73 | Temp 98.7°F | Resp 16 | Ht 65.0 in | Wt 182.0 lb

## 2013-02-13 DIAGNOSIS — I10 Essential (primary) hypertension: Secondary | ICD-10-CM

## 2013-02-13 DIAGNOSIS — N899 Noninflammatory disorder of vagina, unspecified: Secondary | ICD-10-CM

## 2013-02-13 DIAGNOSIS — N898 Other specified noninflammatory disorders of vagina: Secondary | ICD-10-CM

## 2013-02-13 MED ORDER — ACYCLOVIR 200 MG PO CAPS: 200.0000 mg | ORAL_CAPSULE | Freq: Two times a day (BID) | ORAL | Status: DC

## 2013-02-13 MED ORDER — AMLODIPINE BESYLATE 5 MG PO TABS: 5.0000 mg | ORAL_TABLET | Freq: Every day | ORAL | Status: DC

## 2013-02-13 NOTE — Patient Instructions (Addendum)
Thank you for coming in today  We will stop HCTZ and restart amlodipine mg daily Try to get to a healthier weight and exercise for 30 min per day Return in 1 month for BP check  For vaginal irritation, try vaginal moisturizer. If this is not helpful, we can discuss vaginal estrogen at next appointment.   Menopause Menopause is the normal time of life when menstrual periods stop completely. Menopause is complete when you have missed 12 consecutive menstrual periods. It usually occurs between the ages of 47 to 26, with an average age of 15. Very rarely does a woman develop menopause before 56 years old. At menopause, your ovaries stop producing the female hormones, estrogen and progesterone. This can cause undesirable symptoms and also affect your health. Sometimes the symptoms may occur 4 to 5 years before the menopause begins. There is no relationship between menopause and:  Oral contraceptives.  Number of children you had.  Race.  The age your menstrual periods started (menarche). Heavy smokers and very thin women may develop menopause earlier in life. CAUSES  The ovaries stop producing the female hormones estrogen and progesterone.  Other causes include:  Surgery to remove both ovaries.  The ovaries stop functioning for no known reason.  Tumors of the pituitary gland in the brain.  Medical disease that affects the ovaries and hormone production.  Radiation treatment to the abdomen or pelvis.  Chemotherapy that affects the ovaries. SYMPTOMS   Hot flashes.  Night sweats.  Decrease in sex drive.  Vaginal dryness and thinning of the vagina causing painful intercourse.  Dryness of the skin and developing wrinkles.  Headaches.  Tiredness.  Irritability.  Memory problems.  Weight gain.  Bladder infections.  Hair growth of the face and chest.  Infertility. More serious symptoms include:  Loss of bone (osteoporosis) causing breaks  (fractures).  Depression.  Hardening and narrowing of the arteries (atherosclerosis) causing heart attacks and strokes. DIAGNOSIS   When the menstrual periods have stopped for 12 straight months.  Physical exam.  Hormone studies of the blood. TREATMENT  There are many treatment choices and nearly as many questions about them. The decisions to treat or not to treat menopausal changes is an individual choice made with your caregiver. Your caregiver can discuss the treatments with you. Together, you can decide which treatment will work best for you. Your treatment choices may include:   Hormone therapy (estorgen and progesterone).  Non-hormonal medications.  Treating the individual symptoms with medication (for example antidepressants for depression).  Herbal medications that may help specific symptoms.  Counseling by a psychiatrist or psychologist.  Group therapy.  Lifestyle changes including:  Eating healthy.  Regular exercise.  Limiting caffeine and alcohol.  Stress management and meditation.  No treatment. HOME CARE INSTRUCTIONS   Take the medication your caregiver gives you as directed.  Get plenty of sleep and rest.  Exercise regularly.  Eat a diet that contains calcium (good for the bones) and soy products (acts like estrogen hormone).  Avoid alcoholic beverages.  Do not smoke.  If you have hot flashes, dress in layers.  Take supplements, calcium and vitamin D to strengthen bones.  You can use over-the-counter lubricants or moisturizers for vaginal dryness.  Group therapy is sometimes very helpful.  Acupuncture may be helpful in some cases. SEEK MEDICAL CARE IF:   You are not sure you are in menopause.  You are having menopausal symptoms and need advice and treatment.  You are still having menstrual periods  after age 58.  You have pain with intercourse.  Menopause is complete (no menstrual period for 12 months) and you develop vaginal  bleeding.  You need a referral to a specialist (gynecologist, psychiatrist or psychologist) for treatment. SEEK IMMEDIATE MEDICAL CARE IF:   You have severe depression.  You have excessive vaginal bleeding.  You fell and think you have a broken bone.  You have pain when you urinate.  You develop leg or chest pain.  You have a fast pounding heart beat (palpitations).  You have severe headaches.  You develop vision problems.  You feel a lump in your breast.  You have abdominal pain or severe indigestion. Document Released: 07/16/2003 Document Revised: 07/18/2011 Document Reviewed: 02/21/2008 [facility] Patient Information 2014 [place], [place].

## 2013-02-13 NOTE — Progress Notes (Signed)
  Subjective:    Patient ID: Peggy Church, female    DOB: Sep 29, 1955, 57 y.o.   MRN: [id number]  HPI Patient presents with complaint of dry mouth from HCTZ. Has been on this medication for one month. Did not take the medication today. Does not check BP at home. Lisinopril caused cough. Amlodipine caused swelling of ankles.  Patient also complains of vaginal dryness. Symptoms are off and on, worst with intercourse. Started having worsening a few months ago. LMP greater than 5 years. Does occasionally get hot flashes, etc. Has tried KY jelly and astroglide. No vaginal discharge or itching.    Review of Systems  All other systems reviewed and are negative.      Objective:   Physical Exam  Constitutional: She is oriented to person, place, and time. She appears well-developed and well-nourished. No distress.  HENT:  Head: Normocephalic and atraumatic.  Eyes: Conjunctivae are normal. No scleral icterus.  Neck: Neck supple.  Cardiovascular: Normal rate and regular rhythm.   Pulmonary/Chest: Effort normal and breath sounds normal.  Neurological: She is alert and oriented to person, place, and time.  Skin: Skin is warm and dry.  Psychiatric: She has a normal mood and affect. Her behavior is normal.      Assessment & Plan:  #1. HTN - Will stop HCTZ - Start amlodipine 5 mg daily - Return 1 month for BP check  #2. Post-menopausal vaginal dryness - Patient will do trial of vaginal moisturizer OTC - Recommended that she read about vaginal estrogen pros and cons - F/u next month, consider vaginal estrogen if no improvement in symptoms.  #3. H/o genital herpes - Refilled acyclovir - No outbreak in many years.

## 2013-05-19 ENCOUNTER — Ambulatory Visit (INDEPENDENT_AMBULATORY_CARE_PROVIDER_SITE_OTHER): Payer: BC Managed Care – PPO | Admitting: Family Medicine

## 2013-05-19 VITALS — BP 140/82 | HR 69 | Temp 98.5°F | Resp 18 | Ht 65.5 in | Wt 183.0 lb

## 2013-05-19 DIAGNOSIS — B9689 Other specified bacterial agents as the cause of diseases classified elsewhere: Secondary | ICD-10-CM

## 2013-05-19 DIAGNOSIS — R300 Dysuria: Secondary | ICD-10-CM

## 2013-05-19 DIAGNOSIS — N760 Acute vaginitis: Secondary | ICD-10-CM

## 2013-05-19 DIAGNOSIS — N390 Urinary tract infection, site not specified: Secondary | ICD-10-CM

## 2013-05-19 DIAGNOSIS — R350 Frequency of micturition: Secondary | ICD-10-CM

## 2013-05-19 DIAGNOSIS — I10 Essential (primary) hypertension: Secondary | ICD-10-CM

## 2013-05-19 DIAGNOSIS — G43909 Migraine, unspecified, not intractable, without status migrainosus: Secondary | ICD-10-CM

## 2013-05-19 LAB — COMPREHENSIVE METABOLIC PANEL
ALK PHOS: 66 U/L (ref 39–117)
ALT: 9 U/L (ref 0–35)
AST: 13 U/L (ref 0–37)
Albumin: 4.4 g/dL (ref 3.5–5.2)
BUN: 8 mg/dL (ref 6–23)
CO2: 27 mEq/L (ref 19–32)
CREATININE: 0.89 mg/dL (ref 0.50–1.10)
Calcium: 10 mg/dL (ref 8.4–10.5)
Chloride: 106 mEq/L (ref 96–112)
Glucose, Bld: 90 mg/dL (ref 70–99)
Potassium: 4.1 mEq/L (ref 3.5–5.3)
Sodium: 142 mEq/L (ref 135–145)
Total Bilirubin: 0.7 mg/dL (ref 0.3–1.2)
Total Protein: 6.4 g/dL (ref 6.0–8.3)

## 2013-05-19 LAB — POCT UA - MICROSCOPIC ONLY
Crystals, Ur, HPF, POC: NEGATIVE
Yeast, UA: NEGATIVE

## 2013-05-19 LAB — POCT URINALYSIS DIPSTICK
Bilirubin, UA: NEGATIVE
Glucose, UA: NEGATIVE
Ketones, UA: NEGATIVE
Nitrite, UA: NEGATIVE
Protein, UA: NEGATIVE
Spec Grav, UA: 1.025
Urobilinogen, UA: 1
pH, UA: 6.5

## 2013-05-19 LAB — POCT WET PREP WITH KOH
Clue Cells Wet Prep HPF POC: 100
KOH Prep POC: NEGATIVE
Trichomonas, UA: NEGATIVE
YEAST WET PREP PER HPF POC: NEGATIVE

## 2013-05-19 LAB — POCT CBC
Granulocyte percent: 49.8 %G (ref 37–80)
HEMATOCRIT: 45.8 % (ref 37.7–47.9)
Hemoglobin: 15.2 g/dL (ref 12.2–16.2)
LYMPH, POC: 2.5 (ref 0.6–3.4)
MCH, POC: 29.9 pg (ref 27–31.2)
MCHC: 33.2 g/dL (ref 31.8–35.4)
MCV: 90 fL (ref 80–97)
MID (cbc): 0.4 (ref 0–0.9)
MPV: 7.8 fL (ref 0–99.8)
POC Granulocyte: 2.8 (ref 2–6.9)
POC LYMPH %: 43 % (ref 10–50)
POC MID %: 7.2 % (ref 0–12)
Platelet Count, POC: 361 10*3/uL (ref 142–424)
RBC: 5.09 M/uL (ref 4.04–5.48)
RDW, POC: 13.7 %
WBC: 5.7 10*3/uL (ref 4.6–10.2)

## 2013-05-19 LAB — TSH: TSH: 1.109 u[IU]/mL (ref 0.350–4.500)

## 2013-05-19 MED ORDER — CIPROFLOXACIN HCL 500 MG PO TABS: 500.0000 mg | ORAL_TABLET | Freq: Two times a day (BID) | ORAL | Status: DC

## 2013-05-19 MED ORDER — PHENAZOPYRIDINE HCL 200 MG PO TABS: 200.0000 mg | ORAL_TABLET | Freq: Three times a day (TID) | ORAL | Status: DC | PRN

## 2013-05-19 MED ORDER — METRONIDAZOLE 500 MG PO TABS: 500.0000 mg | ORAL_TABLET | Freq: Two times a day (BID) | ORAL | Status: DC

## 2013-05-19 MED ORDER — AMLODIPINE BESYLATE 5 MG PO TABS: 5.0000 mg | ORAL_TABLET | Freq: Every day | ORAL | Status: DC

## 2013-05-19 MED ORDER — FLUCONAZOLE 150 MG PO TABS: 150.0000 mg | ORAL_TABLET | Freq: Once | ORAL | Status: DC

## 2013-05-19 NOTE — Progress Notes (Addendum)
Subjective:    Patient ID: Peggy Church, female    DOB: 02/08/1956, 58 y.o.   MRN: [id number]  HPI Chief Complaint  Patient presents with  . Urinary Frequency  . Blood Pressure Check    lab work  . rx refills    topamax and amlodipine   This chart was scribed for Delman Cheadle, MD by Thea Alken, ED Scribe. This patient was seen in room 14 and the patient's care was started at 11:41 AM.  HPI Comments: Peggy Church is a 58 y.o. female who presents to [facility] complaining of vaginal discomfort and swelling for about one week. She reports vaginal burning both internally and externally and has had urinary urgency and dysuria. She took a yeast infection pill one day ago and has had minor relief of the vaginal sxs. She is also using top benzocaine cream with some relief.  Reports an extensive h/o UTIs starting when she was a child.   She denies vaginal discharge, pelvic pain, nausea, vomiting, back pain, vaginal bleeding.  She is also wants a refill for BP medication. She has not been taking her amlodipine as regularly as she should be - missing about 1/2 of days in the past wk.   She reports she gets dry mouth - wonders if it is from the amlodipine.  She states she does not check BP outside the office ever.  She has not eaten today, but has had coffee this morning w/ irish cream and sugar. Knows she had a h/o severe high chol and used to be on chol med but it never really worked for her. Has not had chol checked in yrs.  She does not take Topamax because she has not recently had migraines. She reports she wants to have Topamax available just incase she has migraines again. She has not had problems w/ migraines in a log time but in the past whenever she used to get one she would just take topamax for a few days and it would always go away.   Past Medical History  Diagnosis Date  . Hypertension   . Allergy   . Migraine    Allergies  Allergen Reactions  .  Benicar [Olmesartan]     Smells and senses things that aren't really there.  . Lisinopril     cough  . Sulfa Drugs Cross Reactors    Prior to Admission medications   Medication Sig Start Date End Date Taking? Authorizing Provider  amLODipine (NORVASC) 5 MG tablet Take 1 tablet (5 mg total) by mouth daily. 02/13/13  Yes Duane Boston, MD  topiramate (TOPAMAX) 100 MG tablet Take 100 mg by mouth as needed. 08/24/11  Yes Timoteo Gaul, FNP  acyclovir (ZOVIRAX) 200 MG capsule Take 1 capsule (200 mg total) by mouth 2 (two) times daily. 02/13/13   Duane Boston, MD   Review of Systems  Constitutional: Negative for fever and chills.  HENT:       Dry mouth  Respiratory: Negative for shortness of breath.   Cardiovascular: Negative for chest pain, palpitations and leg swelling.  Gastrointestinal: Negative for nausea, vomiting and abdominal pain.  Genitourinary: Positive for dysuria, urgency and vaginal pain ( burning). Negative for vaginal bleeding, vaginal discharge and pelvic pain.  Musculoskeletal: Negative for back pain.  Neurological: Negative for headaches.     BP 140/82  Pulse 69  Temp(Src) 98.5 F (36.9 C) (Oral)  Resp 18  Ht 5' 5.5" (1.664 m)  Wt 183 lb (83.008 kg)  BMI 29.98 kg/m2  SpO2 99% Objective:   Physical Exam  Nursing note and vitals reviewed. Constitutional: She is oriented to person, place, and time. She appears well-developed and well-nourished. No distress.  HENT:  Head: Normocephalic and atraumatic.  Eyes: EOM are normal.  Neck: Neck supple. No tracheal deviation present.  Cardiovascular: Normal rate, regular rhythm and normal heart sounds.   Pulmonary/Chest: Effort normal. No respiratory distress.  Genitourinary: There is no rash, tenderness or lesion on the right labia. There is no rash, tenderness or lesion on the left labia. Cervix exhibits no motion tenderness and no friability. No erythema or tenderness around the vagina. No signs of injury around the vagina.  Vaginal discharge found.  Musculoskeletal: Normal range of motion.  Neurological: She is alert and oriented to person, place, and time.  Skin: Skin is warm and dry.  Psychiatric: She has a normal mood and affect. Her behavior is normal.      Results for orders placed in visit on 05/19/13  POCT UA - MICROSCOPIC ONLY      Result Value Range   WBC, Ur, HPF, POC tntc     RBC, urine, microscopic 4-8     Bacteria, U Microscopic trace     Mucus, UA small     Epithelial cells, urine per micros 0-3     Crystals, Ur, HPF, POC neg     Casts, Ur, LPF, POC renal tubular     Yeast, UA neg     Amorphous small    POCT URINALYSIS DIPSTICK      Result Value Range   Color, UA yellow     Clarity, UA hazy     Glucose, UA neg     Bilirubin, UA neg     Ketones, UA neg     Spec Grav, UA 1.025     Blood, UA tr-lysed     pH, UA 6.5     Protein, UA neg     Urobilinogen, UA 1.0     Nitrite, UA neg     Leukocytes, UA small (1+)    POCT WET PREP WITH KOH      Result Value Range   Trichomonas, UA Negative     Clue Cells Wet Prep HPF POC 100%     Epithelial Wet Prep HPF POC 5-15     Yeast Wet Prep HPF POC neg     Bacteria Wet Prep HPF POC 4+     RBC Wet Prep HPF POC 0-2     WBC Wet Prep HPF POC 4-8     KOH Prep POC Negative    POCT CBC      Result Value Range   WBC 5.7  4.6 - 10.2 K/uL   Lymph, poc 2.5  0.6 - 3.4   POC LYMPH PERCENT 43.0  10 - 50 %L   MID (cbc) 0.4  0 - 0.9   POC MID % 7.2  0 - 12 %M   POC Granulocyte 2.8  2 - 6.9   Granulocyte percent 49.8  37 - 80 %G   RBC 5.09  4.04 - 5.48 M/uL   Hemoglobin 15.2  12.2 - 16.2 g/dL   HCT, POC 45.8  37.7 - 47.9 %   MCV 90.0  80 - 97 fL   MCH, POC 29.9  27 - 31.2 pg   MCHC 33.2  31.8 - 35.4 g/dL   RDW, POC 13.7     Platelet Count,  POC 361  142 - 424 K/uL   MPV 7.8  0 - 99.8 fL   Assessment & Plan:  Frequent urination/dysuria - Plan: POCT UA - Microscopic Only, POCT urinalysis dipstick, Urine culture - pt has an extensive h/o UTIs and  would like to proceed w/ trx despite relatively nml UA and requests pyridium- will check clx to confirm - fluconazole sent to pharmacy in case needed after cipro.  HTN (hypertension) - Plan: Comprehensive metabolic panel, TSH - refill amlodipine, pt will work on her compliance.    Vaginitis and vulvovaginitis, unspecified - Plan: POCT Wet Prep with KOH, POCT CBC  Migraine, unspecified, without mention of intractable migraine without mention of status migrainosus - pt was on prn topamax 100mg  which she would take for sev days whenever she felt a migraine coming on. Reviewed that this is a very atypical use of topamax so would recommend trial of other abortive med such as fioricet or imitrex if migraines recur. If having migraines reg and want to take daily preventative med than can reconsider need for topamax. Pt will f/u to discuss if her migraines recur.  Bacterial vaginosis - likely cause of pelvic discomfort and triggered by the Replens vag moisturizer - advised not using this further. Flagyl.  Hypertriglyceridemia - Advised fasting at next visit for repeat lipids - had very high trig prior - pt used to be on chol med but didn't work well apparently.  Meds ordered this encounter  Medications  . amLODipine (NORVASC) 5 MG tablet    Sig: Take 1 tablet (5 mg total) by mouth daily.    Dispense:  90 tablet    Refill:  1  . ciprofloxacin (CIPRO) 500 MG tablet    Sig: Take 1 tablet (500 mg total) by mouth 2 (two) times daily.    Dispense:  6 tablet    Refill:  0  . fluconazole (DIFLUCAN) 150 MG tablet    Sig: Take 1 tablet (150 mg total) by mouth once. Repeat if needed    Dispense:  1 tablet    Refill:  1  . phenazopyridine (PYRIDIUM) 200 MG tablet    Sig: Take 1 tablet (200 mg total) by mouth 3 (three) times daily as needed for pain.    Dispense:  10 tablet    Refill:  1  . metroNIDAZOLE (FLAGYL) 500 MG tablet    Sig: Take 1 tablet (500 mg total) by mouth 2 (two) times daily with a meal.  DO NOT CONSUME ALCOHOL WHILE TAKING THIS MEDICATION.    Dispense:  14 tablet    Refill:  0    I personally performed the services described in this documentation, which was scribed in my presence. The recorded information has been reviewed and considered, and addended by me as needed.  Delman Cheadle, MD MPH

## 2013-05-19 NOTE — Patient Instructions (Signed)

## 2013-05-21 ENCOUNTER — Encounter: Payer: Self-pay | Admitting: Family Medicine

## 2013-05-21 LAB — URINE CULTURE: Colony Count: 50000

## 2013-09-10 ENCOUNTER — Ambulatory Visit (INDEPENDENT_AMBULATORY_CARE_PROVIDER_SITE_OTHER): Payer: BC Managed Care – PPO | Admitting: Physician Assistant

## 2013-09-10 VITALS — BP 124/72 | HR 70 | Temp 98.7°F | Resp 18 | Ht 66.0 in | Wt 187.0 lb

## 2013-09-10 DIAGNOSIS — N899 Noninflammatory disorder of vagina, unspecified: Secondary | ICD-10-CM

## 2013-09-10 DIAGNOSIS — R350 Frequency of micturition: Secondary | ICD-10-CM

## 2013-09-10 DIAGNOSIS — M545 Low back pain, unspecified: Secondary | ICD-10-CM

## 2013-09-10 DIAGNOSIS — R3915 Urgency of urination: Secondary | ICD-10-CM

## 2013-09-10 DIAGNOSIS — N898 Other specified noninflammatory disorders of vagina: Secondary | ICD-10-CM

## 2013-09-10 LAB — POCT URINALYSIS DIPSTICK
Bilirubin, UA: NEGATIVE
Blood, UA: NEGATIVE
Glucose, UA: NEGATIVE
Ketones, UA: NEGATIVE
Leukocytes, UA: NEGATIVE
Nitrite, UA: NEGATIVE
Protein, UA: NEGATIVE
Spec Grav, UA: <=1.005
Urobilinogen, UA: 0.2
pH, UA: 6.5

## 2013-09-10 LAB — POCT UA - MICROSCOPIC ONLY
Bacteria, U Microscopic: NEGATIVE
Casts, Ur, LPF, POC: NEGATIVE
Crystals, Ur, HPF, POC: NEGATIVE
Mucus, UA: NEGATIVE
WBC, Ur, HPF, POC: NEGATIVE
Yeast, UA: NEGATIVE

## 2013-09-10 LAB — POCT WET PREP WITH KOH
CLUE CELLS WET PREP PER HPF POC: NEGATIVE
KOH Prep POC: NEGATIVE
Trichomonas, UA: NEGATIVE
YEAST WET PREP PER HPF POC: NEGATIVE

## 2013-09-10 MED ORDER — FLUCONAZOLE 150 MG PO TABS: 150.0000 mg | ORAL_TABLET | Freq: Once | ORAL | Status: DC

## 2013-09-10 MED ORDER — CIPROFLOXACIN HCL 250 MG PO TABS: 250.0000 mg | ORAL_TABLET | Freq: Two times a day (BID) | ORAL | Status: DC

## 2013-09-10 MED ORDER — PHENAZOPYRIDINE HCL 200 MG PO TABS: 200.0000 mg | ORAL_TABLET | Freq: Three times a day (TID) | ORAL | Status: DC | PRN

## 2013-09-10 NOTE — Progress Notes (Signed)
Subjective:    Patient ID: AHLAYAH TARKOWSKI, female    DOB: 01-Apr-1956, 58 y.o.   MRN: [id number]  HPI   Ms. Tingley is a very pleasant 58 yr old female here with concern for UTI and yeast infection.  Symptoms include external swelling, irritation.  Bladder pressure, urgency, and frequency.  She denies dysuria, and she denies vaginal discharge.  She used Monistat with some relief.  She reports symptoms have been coming on for about 2 wks.  Has used pyridium which helped.  She does have some lower abd/pelvic discomfort.    She reports a long history of urinary symptoms and UTIs.  She reports frequent UTIs as a child and then had her "bladder stretched" at age 21.  Has continued with urinary symptoms since that time.  Reports she's "very sensitive" to UTI symptoms.  On chart review, last urine cx grew 50,000 colonies of e coli.  Urine cx before that had no growth.  She did have BV at last visit in Jan 2015  She is not currently sexually active and has no concern for STI.  LMP 7 yrs ago.  Denies vaginal dryness currently but has had in the past.  Has been suggested vaginal estrogen in the past but is concern for cost and SEs  She does drink coffee every morning.  Occ chai tea.  Mostly water.  Rare etoh.     Review of Systems  Constitutional: Positive for chills. Negative for fever.  Respiratory: Negative.   Cardiovascular: Negative.   Gastrointestinal: Positive for abdominal pain (lower). Negative for nausea and vomiting.  Genitourinary: Positive for urgency and frequency. Negative for dysuria, vaginal discharge and menstrual problem.  Musculoskeletal: Negative.   Skin: Negative.        Objective:   Physical Exam  Vitals reviewed. Constitutional: She is oriented to person, place, and time. She appears well-developed and well-nourished. No distress.  HENT:  Head: Normocephalic and atraumatic.  Eyes: Conjunctivae are normal. No scleral icterus.  Cardiovascular: Normal rate, regular  rhythm and normal heart sounds.   Pulmonary/Chest: Effort normal and breath sounds normal. She has no wheezes. She has no rales.  Abdominal: Soft. Bowel sounds are normal. There is tenderness in the right lower quadrant, suprapubic area and left lower quadrant. There is no CVA tenderness.  Neurological: She is alert and oriented to person, place, and time.  Skin: Skin is warm and dry.  Psychiatric: She has a normal mood and affect. Her behavior is normal.    Results for orders placed in visit on 09/10/13  POCT UA - MICROSCOPIC ONLY      Result Value Ref Range   WBC, Ur, HPF, POC neg     RBC, urine, microscopic 0-1     Bacteria, U Microscopic neg     Mucus, UA neg     Epithelial cells, urine per micros 0-1     Crystals, Ur, HPF, POC neg     Casts, Ur, LPF, POC neg     Yeast, UA neg    POCT URINALYSIS DIPSTICK      Result Value Ref Range   Color, UA yellow     Clarity, UA clear     Glucose, UA neg     Bilirubin, UA neg     Ketones, UA neg     Spec Grav, UA <=1.005     Blood, UA neg     pH, UA 6.5     Protein, UA neg  Urobilinogen, UA 0.2     Nitrite, UA neg     Leukocytes, UA Negative    POCT WET PREP WITH KOH      Result Value Ref Range   Trichomonas, UA Negative     Clue Cells Wet Prep HPF POC neg     Epithelial Wet Prep HPF POC 15-20     Yeast Wet Prep HPF POC neg     Bacteria Wet Prep HPF POC 2+     RBC Wet Prep HPF POC 0-1     WBC Wet Prep HPF POC 0-1     KOH Prep POC Negative     (wet prep self collected)      Assessment & Plan:  Frequency of urination - Plan: POCT UA - Microscopic Only, POCT urinalysis dipstick, Urine culture, phenazopyridine (PYRIDIUM) 200 MG tablet, Ambulatory referral to Urology  Urgency of urination - Plan: POCT UA - Microscopic Only, POCT urinalysis dipstick, phenazopyridine (PYRIDIUM) 200 MG tablet, Ambulatory referral to Urology  Lower back pain - Plan: POCT UA - Microscopic Only, POCT urinalysis dipstick  Vaginal irritation -  Plan: POCT Wet Prep with KOH, fluconazole (DIFLUCAN) 150 MG tablet   Ms. Hepworth is a very pleasant 58 yr old female here with urinary symptoms and vaginal irritation.  UA is clear today and wet prep is negative.  I am doubtful that there is an infectious cause for her symptoms.  Will send cx to confirm.  Discussed possible etiologies with pt including interstitial cystitis/bladder pain, pelvic organ prolapse, and atrophic vaginitis.  I discussed with her that I am hesitant to use antibiotics in the absence of infection.  Pt understands this.  I have sent Cipro to the pharmacy, but she will hold this unless worsening or she hears from me that cx is positive.  I do think a uro eval would be beneficial to further investigate pt's symptoms, esp with history of urologic procedure as a child.  I also think that vaginal atrophy may be contributing to her symptoms and that she may see benefit from vaginal estrogen - she is not ready to start this today, but will consider for the future.  In the mean time, we will treat with pyridium and she will try to avoid bladder irritants   Pt to call or RTC if worsening or not improving  E. Natividad Brood MHS, PA-C [facility] 5/5/20158:04 PM

## 2013-09-10 NOTE — Patient Instructions (Signed)
Take the fluconazole (Diflucan) once - this is a medicine for yeast infection, which will hopefully help some of the irritation you are feeling  Take the pyridium 3 times daily if needed  Continue drinking plenty of fluids (water is best!)  Be aware of foods/beverages from the list of bladder irritants that may be exacerbating symptoms  Consider a trial of vaginal estrogen which may improve your symptoms  I will let you know when your urine culture is back - hold off on the antibiotics until you hear from me, unless you are worsening  I have put in a referral to urology, and you will get a phone call about setting up this appointment  Please let me know if any symptoms are worsening or not improving

## 2013-09-11 LAB — URINE CULTURE
Colony Count: NO GROWTH
Organism ID, Bacteria: NO GROWTH

## 2013-11-04 ENCOUNTER — Ambulatory Visit (INDEPENDENT_AMBULATORY_CARE_PROVIDER_SITE_OTHER): Payer: BC Managed Care – PPO | Admitting: Emergency Medicine

## 2013-11-04 VITALS — BP 130/82 | HR 66 | Temp 98.2°F | Resp 18 | Ht 63.5 in | Wt 179.0 lb

## 2013-11-04 DIAGNOSIS — I10 Essential (primary) hypertension: Secondary | ICD-10-CM

## 2013-11-04 DIAGNOSIS — R002 Palpitations: Secondary | ICD-10-CM

## 2013-11-04 DIAGNOSIS — E782 Mixed hyperlipidemia: Secondary | ICD-10-CM

## 2013-11-04 DIAGNOSIS — Z0000 Encounter for general adult medical examination without abnormal findings: Secondary | ICD-10-CM

## 2013-11-04 DIAGNOSIS — Z1239 Encounter for other screening for malignant neoplasm of breast: Secondary | ICD-10-CM

## 2013-11-04 LAB — CBC WITH DIFFERENTIAL/PLATELET
Basophils Absolute: 0.1 10*3/uL (ref 0.0–0.1)
Basophils Relative: 1 % (ref 0–1)
Eosinophils Absolute: 0.1 10*3/uL (ref 0.0–0.7)
Eosinophils Relative: 1 % (ref 0–5)
HCT: 45.6 % (ref 36.0–46.0)
Hemoglobin: 16.1 g/dL — ABNORMAL HIGH (ref 12.0–15.0)
LYMPHS ABS: 2.1 10*3/uL (ref 0.7–4.0)
LYMPHS PCT: 35 % (ref 12–46)
MCH: 29.7 pg (ref 26.0–34.0)
MCHC: 35.3 g/dL (ref 30.0–36.0)
MCV: 84.1 fL (ref 78.0–100.0)
MONOS PCT: 8 % (ref 3–12)
Monocytes Absolute: 0.5 10*3/uL (ref 0.1–1.0)
NEUTROS PCT: 55 % (ref 43–77)
Neutro Abs: 3.3 10*3/uL (ref 1.7–7.7)
PLATELETS: 353 10*3/uL (ref 150–400)
RBC: 5.42 MIL/uL — AB (ref 3.87–5.11)
RDW: 13.6 % (ref 11.5–15.5)
WBC: 6 10*3/uL (ref 4.0–10.5)

## 2013-11-04 LAB — LIPID PANEL
CHOLESTEROL: 332 mg/dL — AB (ref 0–200)
HDL: 55 mg/dL (ref >39)
LDL Cholesterol: 238 mg/dL — ABNORMAL HIGH (ref 0–99)
TRIGLYCERIDES: 193 mg/dL — AB (ref <150)
Total CHOL/HDL Ratio: 6 Ratio
VLDL: 39 mg/dL (ref 0–40)

## 2013-11-04 LAB — COMPREHENSIVE METABOLIC PANEL
ALBUMIN: 5 g/dL (ref 3.5–5.2)
ALT: 11 U/L (ref 0–35)
AST: 13 U/L (ref 0–37)
Alkaline Phosphatase: 80 U/L (ref 39–117)
BUN: 11 mg/dL (ref 6–23)
CALCIUM: 9.7 mg/dL (ref 8.4–10.5)
CHLORIDE: 103 meq/L (ref 96–112)
CO2: 28 meq/L (ref 19–32)
Creat: 0.88 mg/dL (ref 0.50–1.10)
GLUCOSE: 106 mg/dL — AB (ref 70–99)
POTASSIUM: 3.7 meq/L (ref 3.5–5.3)
SODIUM: 141 meq/L (ref 135–145)
TOTAL PROTEIN: 7 g/dL (ref 6.0–8.3)
Total Bilirubin: 0.9 mg/dL (ref 0.2–1.2)

## 2013-11-04 LAB — POCT URINALYSIS DIPSTICK
Bilirubin, UA: NEGATIVE
Blood, UA: NEGATIVE
Glucose, UA: NEGATIVE
KETONES UA: NEGATIVE
LEUKOCYTES UA: NEGATIVE
Nitrite, UA: NEGATIVE
PH UA: 6.5
Protein, UA: NEGATIVE
Spec Grav, UA: 1.015
Urobilinogen, UA: 1

## 2013-11-04 LAB — POCT UA - MICROSCOPIC ONLY
BACTERIA, U MICROSCOPIC: NEGATIVE
CRYSTALS, UR, HPF, POC: NEGATIVE
Casts, Ur, LPF, POC: NEGATIVE
RBC, URINE, MICROSCOPIC: NEGATIVE
Yeast, UA: NEGATIVE

## 2013-11-04 LAB — TSH: TSH: 1.099 u[IU]/mL (ref 0.350–4.500)

## 2013-11-04 NOTE — Addendum Note (Signed)
Addended by: Dan Humphreys on: 11/04/2013 11:38 AM   Modules accepted: Orders

## 2013-11-04 NOTE — Progress Notes (Addendum)
[facility] [address], [place] [address] [phone]  Date:  11/04/2013   Name:  Peggy Church   DOB:  [birth year]   MRN:  [id number]  PCP:  Donia Ast, FNP    Chief Complaint: Annual Exam   History of Present Illness:  Peggy Church is a 58 y.o. very pleasant female patient who presents with the following:  For annual physical.  Has hypertension and HLD not on medication for the latter.  Concerned about adverse effects of lipitor.  Reformed smoker.  Says she gets "charged up" around big screen tv's and wifi networks and it causes her to "tingle" allover.  Says it affects her heart and she has palpitations  No chest pain, shortness of breath. Locks her cell phone in the trunk on airplane mode.  Last pap 2013  Otherwise has no current complaint.    Patient Active Problem List   Diagnosis Date Noted  . HTN (hypertension) 06/26/2012    Past Medical History  Diagnosis Date  . Hypertension   . Allergy   . Migraine     Past Surgical History  Procedure Laterality Date  . Tubal ligation    . Ectopic pregnancy surgery    . Tonsillectomy and adenoidectomy    . Bladder surgery  1963    stretched    History  Substance Use Topics  . Smoking status: Former Research scientist (life sciences)  . Smokeless tobacco: Not on file  . Alcohol Use: Yes     Comment: moderately    Family History  Problem Relation Age of Onset  . Hyperlipidemia Mother   . Hypertension Mother   . Stroke Mother   . Heart disease Mother   . Cancer Father     prostate  . Hyperlipidemia Father   . Hypertension Father   . Cancer Brother     Allergies  Allergen Reactions  . Benicar [Olmesartan]     Smells and senses things that aren't really there.  . Lisinopril     cough  . Sulfa Drugs Cross Reactors     Medication list has been reviewed and updated.  Current Outpatient Prescriptions on File Prior to Visit  Medication Sig Dispense Refill  . amLODipine (NORVASC) 5 MG tablet Take  1 tablet (5 mg total) by mouth daily.  90 tablet  1  . acyclovir (ZOVIRAX) 200 MG capsule Take 1 capsule (200 mg total) by mouth 2 (two) times daily.  60 capsule  0  . ciprofloxacin (CIPRO) 250 MG tablet Take 1 tablet (250 mg total) by mouth 2 (two) times daily.  6 tablet  0  . ciprofloxacin (CIPRO) 500 MG tablet Take 1 tablet (500 mg total) by mouth 2 (two) times daily.  6 tablet  0  . fluconazole (DIFLUCAN) 150 MG tablet Take 1 tablet (150 mg total) by mouth once. Repeat in 1 wk if needed  2 tablet  0  . metroNIDAZOLE (FLAGYL) 500 MG tablet Take 1 tablet (500 mg total) by mouth 2 (two) times daily with a meal. DO NOT CONSUME ALCOHOL WHILE TAKING THIS MEDICATION.  14 tablet  0  . phenazopyridine (PYRIDIUM) 200 MG tablet Take 1 tablet (200 mg total) by mouth 3 (three) times daily as needed for pain.  30 tablet  0  . topiramate (TOPAMAX) 100 MG tablet Take 100 mg by mouth as needed.       No current facility-administered medications on file prior to visit.    Review of Systems:  As per HPI, otherwise negative.    Physical Examination: Filed Vitals:   11/04/13 0930  BP: 130/82  Pulse: 66  Temp: 98.2 F (36.8 C)  Resp: 18   Filed Vitals:   11/04/13 0930  Height: 5' 3.5" (1.613 m)  Weight: 179 lb (81.194 kg)   Body mass index is 31.21 kg/(m^2). Ideal Body Weight: Weight in (lb) to have BMI = 25: 143.1  GEN: WDWN, NAD, Non-toxic, A & O x 3 HEENT: Atraumatic, Normocephalic. Neck supple. No masses, No LAD. Ears and Nose: No external deformity. CV: RRR, No M/G/R. No JVD. No thrill. No extra heart sounds. PULM: CTA B, no wheezes, crackles, rhonchi. No retractions. No resp. distress. No accessory muscle use. ABD: S, NT, ND, +BS. No rebound. No HSM. EXTR: No c/c/e NEURO Normal gait.  PSYCH: Normally interactive. Conversant. Not depressed or anxious appearing.  Calm demeanor.    Assessment and Plan: Wellness examination GI consult Mammogram Labs pending  Signed,  Ellison Carwin, MD

## 2013-11-04 NOTE — Addendum Note (Signed)
Addended by: Roselee Culver on: 11/04/2013 11:27 AM   Modules accepted: Orders

## 2013-11-05 ENCOUNTER — Other Ambulatory Visit: Payer: Self-pay | Admitting: Emergency Medicine

## 2013-11-05 DIAGNOSIS — Z1231 Encounter for screening mammogram for malignant neoplasm of breast: Secondary | ICD-10-CM

## 2013-11-11 ENCOUNTER — Ambulatory Visit: Payer: Self-pay

## 2013-12-09 ENCOUNTER — Encounter: Payer: Self-pay | Admitting: Emergency Medicine

## 2013-12-18 ENCOUNTER — Other Ambulatory Visit: Payer: Self-pay | Admitting: Family Medicine

## 2014-07-08 ENCOUNTER — Other Ambulatory Visit: Payer: Self-pay | Admitting: Emergency Medicine

## 2014-08-13 ENCOUNTER — Ambulatory Visit (INDEPENDENT_AMBULATORY_CARE_PROVIDER_SITE_OTHER): Payer: BC Managed Care – PPO | Admitting: Family Medicine

## 2014-08-13 VITALS — BP 130/90 | HR 62 | Temp 98.2°F | Ht 65.0 in | Wt 184.4 lb

## 2014-08-13 DIAGNOSIS — I10 Essential (primary) hypertension: Secondary | ICD-10-CM

## 2014-08-13 MED ORDER — CHLORTHALIDONE 100 MG PO TABS: 100.0000 mg | ORAL_TABLET | Freq: Every day | ORAL | Status: DC

## 2014-08-13 MED ORDER — AMLODIPINE BESYLATE 5 MG PO TABS: 5.0000 mg | ORAL_TABLET | Freq: Every day | ORAL | Status: DC

## 2014-08-13 NOTE — Patient Instructions (Signed)

## 2014-08-13 NOTE — Progress Notes (Signed)
Patient ID: Peggy Church, female   DOB: [birth year], 59 y.o.   MRN: [id number]   This chart was scribed for Robyn Haber, MD by [facility], medical scribe at [facility].The patient was seen in exam room 12 and the patient's care was started at 5:52 PM.  Patient ID: Peggy Church MRN: [id number], DOB: 03-17-1956, 59 y.o. Date of Encounter: 08/13/2014  Primary Physician: Donia Ast, FNP  Chief Complaint:  Chief Complaint  Patient presents with   Medication Refill    Needs refill on Amlodipine   HPI:  Peggy Church is a 59 y.o. female with a history of hypertension who presents to [facility] for medication refill. She needs a refill for amlodipine. Pt states recent weight gain may attribute to her elevated blood pressure. She is originally from the [facility] and moved here. She used to take a diuretic but she would have dry mouth as a side effect. Pt does have leg swelling because she is on her feet a lot. She is a vegetarian but eats a lot of soup out of a can. Recheck was blood pressure 160/100.  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  Past Medical History  Diagnosis Date   Hypertension    Allergy    Migraine     Home Meds: Prior to Admission medications   Medication Sig Start Date End Date Taking? Authorizing Provider  amLODipine (NORVASC) 5 MG tablet Take 1 tablet (5 mg total) by mouth daily. PATIENT NEEDS OFFICE VISIT FOR ADDITIONAL REFILLS 07/09/14  Yes Chelle Janalee Dane, PA-C   Allergies:  Allergies  Allergen Reactions   Benicar [Olmesartan]     Smells and senses things that aren't really there.   Lisinopril     cough   Sulfa Drugs Cross Reactors    History   Social History   Marital Status: Divorced    Spouse Name: N/A   Number of Children: N/A   Years of Education: N/A   Occupational History   Not on file.   Social History Main Topics   Smoking status: Former Smoker    Smokeless tobacco: Not on file   Alcohol Use: 0.0 oz/week    0 Standard drinks or equivalent per week     Comment: moderately   Drug Use: No   Sexual Activity: Not on file   Other Topics Concern   Not on file   Social History Narrative    Review of Systems: Constitutional: negative for chills, fever, night sweats, weight changes, or fatigue  HEENT: negative for vision changes, hearing loss, congestion, rhinorrhea, ST, epistaxis, or sinus pressure Cardiovascular: negative for chest pain or palpitations. Positive for leg swelling. Respiratory: negative for hemoptysis, wheezing, shortness of breath, or cough Abdominal: negative for abdominal pain, nausea, vomiting, diarrhea, or constipation Dermatological: negative for rash Neurologic: negative for headache, dizziness, or syncope All other systems reviewed and are otherwise negative with the exception to those above and in the HPI.  Physical Exam: Blood pressure 130/90, pulse 62, temperature 98.2 F (36.8 C), temperature source Oral, height 5\' 5"  (1.651 m), weight 184 lb 6 oz (83.632 kg), SpO2 100 %., Body mass index is 30.68 kg/(m^2). General: Well developed, well nourished, in no acute distress. Head: Normocephalic, atraumatic, eyes without discharge, sclera non-icteric, nares are without discharge. Bilateral auditory canals clear, TM's are without perforation, pearly grey and translucent with reflective cone of light bilaterally. Oral cavity moist, posterior pharynx without exudate, erythema,  peritonsillar abscess, or post nasal drip.  Neck: Supple. No thyromegaly. Full ROM. No lymphadenopathy. Lungs: Clear bilaterally to auscultation without wheezes, rales, or rhonchi. Breathing is unlabored. Heart: RRR with S1 S2. No murmurs, rubs, or gallops appreciated. Abdomen: Soft, non-tender, non-distended with normoactive bowel sounds. No hepatomegaly. No rebound/guarding. No obvious abdominal masses. Msk:  Strength and tone normal for  age. Extremities/Skin: Warm and dry. No clubbing or cyanosis. No edema. No rashes or suspicious lesions. Neuro: Alert and oriented X 3. Moves all extremities spontaneously. Gait is normal. CNII-XII grossly in tact. Psych:  Responds to questions appropriately with a normal affect.   Labs:  ASSESSMENT AND PLAN:  59 y.o. year old female with hypertension, poorly controlled  This chart was scribed in my presence and reviewed by me personally.    ICD-9-CM ICD-10-CM   1. Essential hypertension 401.9 I10 amLODipine (NORVASC) 5 MG tablet     chlorthalidone (HYGROTEN) 100 MG tablet     Signed, Robyn Haber, MD  Signed, Robyn Haber, MD 08/13/2014 5:52 PM

## 2015-04-23 ENCOUNTER — Ambulatory Visit (INDEPENDENT_AMBULATORY_CARE_PROVIDER_SITE_OTHER): Payer: Self-pay | Admitting: Emergency Medicine

## 2015-04-23 VITALS — BP 104/82 | HR 83 | Temp 98.7°F | Resp 16 | Ht 65.0 in | Wt 190.0 lb

## 2015-04-23 DIAGNOSIS — Z024 Encounter for examination for driving license: Secondary | ICD-10-CM

## 2015-04-23 DIAGNOSIS — Z021 Encounter for pre-employment examination: Secondary | ICD-10-CM

## 2015-04-23 NOTE — Progress Notes (Signed)
Subjective:  Patient ID: Peggy Church, female    DOB: Mar 04, 1956  Age: 59 y.o. 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   She has past surgical history that includes Tubal ligation; Ectopic pregnancy surgery; Tonsillectomy and adenoidectomy; and Bladder surgery (1963).   Her  family history includes Cancer in her brother and father; Heart disease in her mother; Hyperlipidemia in her father and mother; Hypertension in her father and mother; Stroke in her mother.  She   reports that she has quit smoking. She does not have any smokeless tobacco history on file. She reports that she drinks alcohol. She reports that she does not use illicit drugs.  Outpatient Prescriptions Prior to Visit  Medication Sig Dispense Refill  . amLODipine (NORVASC) 5 MG tablet Take 1 tablet (5 mg total) by mouth daily. 90 tablet 3  . chlorthalidone (HYGROTEN) 100 MG tablet Take 1 tablet (100 mg total) by mouth daily. 90 tablet 3   No facility-administered medications prior to visit.    Social History   Social History  . Marital Status: Divorced    Spouse Name: N/A  . Number of Children: N/A  . Years of Education: N/A   Social History Main Topics  . Smoking status: Former Research scientist (life sciences)  . Smokeless tobacco: None  . Alcohol Use: 0.0 oz/week    0 Standard drinks or equivalent per week     Comment: moderately  . Drug Use: No  . Sexual Activity: Not Asked   Other Topics Concern  . None   Social History Narrative     Review of Systems  Constitutional: Negative for fever, chills and appetite change.  HENT: Negative for congestion, ear pain, postnasal drip, sinus pressure and sore throat.   Eyes: Negative for pain and redness.  Respiratory: Negative for cough, shortness of breath and wheezing.     Cardiovascular: Negative for leg swelling.  Gastrointestinal: Negative for nausea, vomiting, abdominal pain, diarrhea, constipation and blood in stool.  Endocrine: Negative for polyuria.  Genitourinary: Negative for dysuria, urgency, frequency and flank pain.  Musculoskeletal: Negative for gait problem.  Skin: Negative for rash.  Neurological: Negative for weakness and headaches.  Psychiatric/Behavioral: Negative for confusion and decreased concentration. The patient is not nervous/anxious.     Objective:  BP 104/82 mmHg  Pulse 83  Temp(Src) 98.7 F (37.1 C)  Resp 16  Ht 5\' 5"  (1.651 m)  Wt 190 lb (86.183 kg)  BMI 31.62 kg/m2  SpO2 98%  Physical Exam  Constitutional: She is oriented to person, place, and time. She appears well-developed and well-nourished. No distress.  HENT:  Head: Normocephalic and atraumatic.  Right Ear: External ear normal.  Left Ear: External ear normal.  Nose: Nose normal.  Eyes: Conjunctivae and EOM are normal. Pupils are equal, round, and reactive to light. No scleral icterus.  Neck: Normal range of motion. Neck supple. No tracheal deviation present.  Cardiovascular: Normal rate, regular rhythm and normal heart sounds.   Pulmonary/Chest: Effort normal. No respiratory distress. She has no wheezes. She has no rales.  Abdominal: She exhibits no mass. There is no tenderness. There is no rebound and no guarding.  Musculoskeletal: She exhibits no edema.  Lymphadenopathy:    She has no cervical adenopathy.  Neurological: She is alert and oriented to person, place,  and time. Coordination normal.  Skin: Skin is warm and dry. No rash noted.  Psychiatric: She has a normal mood and affect. Her behavior is normal.      Assessment & Plan:   Peggy Church was seen today for annual exam.  Diagnoses and all orders for this visit:  Encounter for commercial driver medical examination (CDME)  I am having Peggy Church maintain her amLODipine and chlorthalidone.  No  orders of the defined types were placed in this encounter.    Appropriate red flag conditions were discussed with the patient as well as actions that should be taken.  Patient expressed his understanding.  Follow-up: Return in about 1 year (around 04/22/2016).  Peggy Culver, MD

## 2015-06-05 MED FILL — CHLORTHALIDONE 50 MG TABLET: 50 | 30 days supply | Qty: 60 | Fill #3

## 2015-06-29 ENCOUNTER — Ambulatory Visit (INDEPENDENT_AMBULATORY_CARE_PROVIDER_SITE_OTHER): Payer: BC Managed Care – PPO | Admitting: Emergency Medicine

## 2015-06-29 VITALS — BP 122/80 | HR 71 | Temp 98.8°F | Resp 18 | Ht 65.75 in | Wt 187.2 lb

## 2015-06-29 DIAGNOSIS — R059 Cough, unspecified: Secondary | ICD-10-CM

## 2015-06-29 DIAGNOSIS — J029 Acute pharyngitis, unspecified: Secondary | ICD-10-CM | POA: Diagnosis not present

## 2015-06-29 DIAGNOSIS — R05 Cough: Secondary | ICD-10-CM

## 2015-06-29 DIAGNOSIS — J028 Acute pharyngitis due to other specified organisms: Secondary | ICD-10-CM

## 2015-06-29 DIAGNOSIS — B9789 Other viral agents as the cause of diseases classified elsewhere: Secondary | ICD-10-CM

## 2015-06-29 LAB — POCT RAPID STREP A (OFFICE): Rapid Strep A Screen: NEGATIVE

## 2015-06-29 LAB — POCT INFLUENZA A/B
Influenza A, POC: NEGATIVE
Influenza B, POC: NEGATIVE

## 2015-06-29 NOTE — Progress Notes (Signed)
By signing my name below, I, Peggy Church, attest that this documentation has been prepared under the direction and in the presence of Arlyss Queen, MD. Electronically Signed: Moises Church, [place]. 06/29/2015 , 11:39 AM .  Patient was seen in room 7 .  Chief Complaint:  Chief Complaint  Patient presents with  . URI    started friday    HPI: Peggy Church is a 60 y.o. female who reports to [facility] today complaining of possible flu that started 3 days ago. Her coworker was sick and went to work 5 days ago. She states that it started with chills and headache, feeling very fatigue. She's also been having some cough and mild sore throat. She's been trying to stay hydrated but without any relief to her symptoms. She denies flu shot this year.   She works at the [employer].   Past Medical History  Diagnosis Date  . Hypertension   . Allergy   . Migraine    Past Surgical History  Procedure Laterality Date  . Tubal ligation    . Ectopic pregnancy surgery    . Tonsillectomy and adenoidectomy    . Bladder surgery  1963    stretched   Social History   Social History  . Marital Status: Divorced    Spouse Name: N/A  . Number of Children: N/A  . Years of Education: N/A   Social History Main Topics  . Smoking status: Former Research scientist (life sciences)  . Smokeless tobacco: None  . Alcohol Use: 0.0 oz/week    0 Standard drinks or equivalent per week     Comment: moderately  . Drug Use: No  . Sexual Activity: Not Asked   Other Topics Concern  . None   Social History Narrative   Family History  Problem Relation Age of Onset  . Hyperlipidemia Mother   . Hypertension Mother   . Stroke Mother   . Heart disease Mother   . Cancer Father     prostate  . Hyperlipidemia Father   . Hypertension Father   . Cancer Brother    Allergies  Allergen Reactions  . Benicar [Olmesartan]     Smells and senses things that aren't really there.  . Lisinopril     cough  . Sulfa Drugs Cross  Reactors    Prior to Admission medications   Medication Sig Start Date End Date Taking? Authorizing Provider  amLODipine (NORVASC) 5 MG tablet Take 1 tablet (5 mg total) by mouth daily. 08/13/14  Yes Robyn Haber, MD  chlorthalidone (HYGROTEN) 100 MG tablet Take 1 tablet (100 mg total) by mouth daily. 08/13/14  Yes Robyn Haber, MD     ROS:  Constitutional: negative for fever, night sweats, weight changes; positive for chills, fatigue HEENT: negative for vision changes, hearing loss, congestion, rhinorrhea, epistaxis, or sinus pressure; positive for sore throat Cardiovascular: negative for chest pain or palpitations Respiratory: negative for hemoptysis, wheezing, shortness of breath; positive for cough Abdominal: negative for abdominal pain, nausea, vomiting, diarrhea, or constipation Dermatological: negative for rash Neurologic: negative for dizziness, or syncope; positive for headache All other systems reviewed and are otherwise negative with the exception to those above and in the HPI.  PHYSICAL EXAM: Filed Vitals:   06/29/15 1053  BP: 122/80  Pulse: 71  Temp: 98.8 F (37.1 C)  Resp: 18   Body mass index is 30.45 kg/(m^2).   General: Alert, no acute distress HEENT:  Normocephalic, atraumatic, oropharynx patent; Nasal congestion, redness in oropharynx Eye: EOMI,  [facility] Cardiovascular:  Regular rate and rhythm, no rubs murmurs or gallops.  No Carotid bruits, radial pulse intact. No pedal edema.  Respiratory: Clear to auscultation bilaterally.  No wheezes, rales, or rhonchi.  No cyanosis, no use of accessory musculature Abdominal: No organomegaly, abdomen is soft and non-tender, positive bowel sounds. No masses. Musculoskeletal: Gait intact. No edema, tenderness Skin: No rashes. Neurologic: Facial musculature symmetric. Psychiatric: Patient acts appropriately throughout our interaction.  Lymphatic: No cervical or submandibular lymphadenopathy Genitourinary/Anorectal: No  acute findings  LABS: Results for orders placed or performed in visit on 06/29/15  POCT Influenza A/B  Result Value Ref Range   Influenza A, POC Negative Negative   Influenza B, POC Negative Negative  POCT rapid strep A  Result Value Ref Range   Rapid Strep A Screen Negative Negative    EKG/XRAY:   Primary read interpreted by Dr. Everlene Farrier at [facility].   ASSESSMENT/PLAN: Patient tested negative for flu and strep. Treat symptomatically with Tylenol or ibuprofen fluids and rest. I personally performed the services described in this documentation, which was scribed in my presence. The recorded information has been reviewed and is accurate.  Gross sideeffects, risk and benefits, and alternatives of medications d/w patient. Patient is aware that all medications have potential sideeffects and we are unable to predict every sideeffect or drug-drug interaction that may occur.  Arlyss Queen MD 06/29/2015 11:39 AM

## 2015-07-07 MED FILL — AMLODIPINE BESYLATE 5 MG TA: 5 | 90 days supply | Qty: 90 | Fill #3

## 2015-08-19 ENCOUNTER — Other Ambulatory Visit: Payer: Self-pay | Admitting: Family Medicine

## 2015-08-20 MED FILL — CHLORTHALIDONE 50 MG TABLET: 50 | 30 days supply | Qty: 60 | Fill #0

## 2015-11-03 ENCOUNTER — Other Ambulatory Visit: Payer: Self-pay | Admitting: Family Medicine

## 2015-11-05 ENCOUNTER — Ambulatory Visit (INDEPENDENT_AMBULATORY_CARE_PROVIDER_SITE_OTHER): Payer: BC Managed Care – PPO | Admitting: Physician Assistant

## 2015-11-05 VITALS — BP 118/82 | HR 65 | Temp 98.7°F | Resp 18 | Ht 65.5 in | Wt 193.0 lb

## 2015-11-05 DIAGNOSIS — I10 Essential (primary) hypertension: Secondary | ICD-10-CM

## 2015-11-05 DIAGNOSIS — Z6831 Body mass index (BMI) 31.0-31.9, adult: Secondary | ICD-10-CM | POA: Insufficient documentation

## 2015-11-05 LAB — CBC WITH DIFFERENTIAL/PLATELET
BASOS PCT: 1 %
Basophils Absolute: 70 cells/uL (ref 0–200)
EOS ABS: 140 {cells}/uL (ref 15–500)
Eosinophils Relative: 2 %
HEMATOCRIT: 45.5 % — AB (ref 35.0–45.0)
Hemoglobin: 15.9 g/dL — ABNORMAL HIGH (ref 11.7–15.5)
Lymphocytes Relative: 51 %
Lymphs Abs: 3570 cells/uL (ref 850–3900)
MCH: 28.9 pg (ref 27.0–33.0)
MCHC: 34.9 g/dL (ref 32.0–36.0)
MCV: 82.6 fL (ref 80.0–100.0)
MONO ABS: 560 {cells}/uL (ref 200–950)
MPV: 8.7 fL (ref 7.5–12.5)
Monocytes Relative: 8 %
NEUTROS PCT: 38 %
Neutro Abs: 2660 cells/uL (ref 1500–7800)
Platelets: 346 10*3/uL (ref 140–400)
RBC: 5.51 MIL/uL — ABNORMAL HIGH (ref 3.80–5.10)
RDW: 13.8 % (ref 11.0–15.0)
WBC: 7 10*3/uL (ref 3.8–10.8)

## 2015-11-05 LAB — COMPREHENSIVE METABOLIC PANEL
ALK PHOS: 75 U/L (ref 33–130)
ALT: 25 U/L (ref 6–29)
AST: 22 U/L (ref 10–35)
Albumin: 4.4 g/dL (ref 3.6–5.1)
BUN: 13 mg/dL (ref 7–25)
CALCIUM: 9.8 mg/dL (ref 8.6–10.4)
CHLORIDE: 97 mmol/L — AB (ref 98–110)
CO2: 28 mmol/L (ref 20–31)
Creat: 0.84 mg/dL (ref 0.50–1.05)
Glucose, Bld: 92 mg/dL (ref 65–99)
Potassium: 3.4 mmol/L — ABNORMAL LOW (ref 3.5–5.3)
Sodium: 139 mmol/L (ref 135–146)
TOTAL PROTEIN: 7 g/dL (ref 6.1–8.1)
Total Bilirubin: 0.9 mg/dL (ref 0.2–1.2)

## 2015-11-05 MED ORDER — CHLORTHALIDONE 50 MG PO TABS: 100.0000 mg | ORAL_TABLET | Freq: Every day | ORAL | Status: DC

## 2015-11-05 MED ORDER — AMLODIPINE BESYLATE 5 MG PO TABS: 5.0000 mg | ORAL_TABLET | Freq: Every day | ORAL | Status: DC

## 2015-11-05 MED FILL — CHLORTHALIDONE 50 MG TABLET: 50 | 30 days supply | Qty: 60 | Fill #0

## 2015-11-05 MED FILL — AMLODIPINE BESYLATE 5 MG TA: 5 | 90 days supply | Qty: 90 | Fill #0

## 2015-11-05 NOTE — Patient Instructions (Addendum)
     IF you received an x-ray today, you will receive an invoice from [facility]. Please contact [facility] at [phone] with questions or concerns regarding your invoice.   IF you received labwork today, you will receive an invoice from [facility]. Please contact [facility] at [phone] with questions or concerns regarding your invoice.   Our billing staff will not be able to assist you with questions regarding bills from these companies.  You will be contacted with the lab results as soon as they are available. The fastest way to get your results is to activate your My Chart account. Instructions are located on the last page of this paperwork. If you have not heard from us regarding the results in 2 weeks, please contact this office.     We recommend that you schedule a mammogram for breast cancer screening. Typically, you do not need a referral to do this. Please contact a local imaging center to schedule your mammogram.  [facility] - [phone]  *ask for the Radiology Department [facility]) - [phone] or [phone]  [facility] - [phone] [facility] - [phone] [facility] - [phone]  *ask for the Radiology Department [facility] - [phone]  *ask for the Radiology Department [facility] - [phone]  *ask for the Mammography Department [facility] - [phone]  

## 2015-11-05 NOTE — Progress Notes (Signed)
Patient ID: Peggy Church, female    DOB: 01/25/1956, 60 y.o.   MRN: [id number]  PCP: No primary care provider on file.  Subjective:   Chief Complaint  Patient presents with  . Medication Refill    norvasc, hygroton    HPI Presents for medication refills.  Still has some LE swelling. Eats a lot of preprepared foods, which contain more salt. Drinks 48 ounces of water a day. Puts some apple cider vinegar in it so it tastes better, and I feel better with it.  It's been a long time since her last mammogram, "but I'm not going to do that again." Had some fatty lumps, and they kept chasing them.  Review of Systems  Constitutional: Negative for activity change, appetite change, fatigue and unexpected weight change.  HENT: Negative for congestion, dental problem, ear pain, hearing loss, mouth sores, postnasal drip, rhinorrhea, sneezing, sore throat, tinnitus and trouble swallowing.   Eyes: Negative for photophobia, pain, redness and visual disturbance.  Respiratory: Negative for cough, chest tightness and shortness of breath.   Cardiovascular: Positive for leg swelling. Negative for chest pain and palpitations.  Gastrointestinal: Negative for nausea, vomiting, abdominal pain, diarrhea, constipation and blood in stool.  Genitourinary: Negative for dysuria, urgency, frequency and hematuria.  Musculoskeletal: Negative for myalgias, arthralgias, gait problem and neck stiffness.  Skin: Negative for rash.  Neurological: Negative for dizziness, speech difficulty, weakness, light-headedness, numbness and headaches.  Hematological: Negative for adenopathy.  Psychiatric/Behavioral: Negative for confusion and sleep disturbance. The patient is not nervous/anxious.        Patient Active Problem List   Diagnosis Date Noted  . BMI 31.0-31.9,adult 11/05/2015  . HTN (hypertension) 06/26/2012     Prior to Admission medications   Medication Sig Start Date End Date Taking? Authorizing Provider    amLODipine (NORVASC) 5 MG tablet Take 1 tablet (5 mg total) by mouth daily. 08/13/14  Yes Robyn Haber, MD  chlorthalidone (HYGROTON) 50 MG tablet TAKE 2 TABLETS BY MOUTH DAILY 08/20/15  Yes Robyn Haber, MD  Multiple Vitamin (MULTI-VITAMINS) TABS Take by mouth daily.   Yes Historical Provider, MD     Allergies  Allergen Reactions  . Benicar [Olmesartan]     Smells and senses things that aren't really there.  . Lisinopril     cough  . Sulfa Drugs Cross Reactors        Objective:  Physical Exam  Constitutional: She is oriented to person, place, and time. She appears well-developed and well-nourished. She is active and cooperative. No distress.  BP 118/82 mmHg  Pulse 65  Temp(Src) 98.7 F (37.1 C) (Oral)  Resp 18  Ht 5' 5.5" (1.664 m)  Wt 193 lb (87.544 kg)  BMI 31.62 kg/m2  SpO2 97%  HENT:  Head: Normocephalic and atraumatic.  Right Ear: Hearing normal.  Left Ear: Hearing normal.  Eyes: Conjunctivae are normal. No scleral icterus.  Neck: Normal range of motion. Neck supple. No thyromegaly present.  Cardiovascular: Normal rate, regular rhythm and normal heart sounds.   Pulses:      Radial pulses are 2+ on the right side, and 2+ on the left side.       Dorsalis pedis pulses are 2+ on the right side, and 2+ on the left side.       Posterior tibial pulses are 2+ on the right side, and 2+ on the left side.  No LE edema  Pulmonary/Chest: Effort normal and breath sounds normal.  Lymphadenopathy:  Head (right side): No tonsillar, no preauricular, no posterior auricular and no occipital adenopathy present.       Head (left side): No tonsillar, no preauricular, no posterior auricular and no occipital adenopathy present.    She has no cervical adenopathy.       Right: No supraclavicular adenopathy present.       Left: No supraclavicular adenopathy present.  Neurological: She is alert and oriented to person, place, and time. No sensory deficit.  Skin: Skin is warm, dry  and intact. No rash noted. No cyanosis or erythema. Nails show no clubbing.  Psychiatric: She has a normal mood and affect. Her speech is normal and behavior is normal.           Assessment & Plan:   1. Essential hypertension Continue current treatment. Increase oral hydration to 64 ounces daily and consider ways to reduce dietary salt. Increase exercise. - CBC with Differential/Platelet - Comprehensive metabolic panel - Care order/instruction   Meds ordered this encounter  Medications  . Multiple Vitamin (MULTI-VITAMINS) TABS    Sig: Take by mouth daily.  . chlorthalidone (HYGROTON) 50 MG tablet    Sig: Take 2 tablets (100 mg total) by mouth daily.    Dispense:  180 tablet    Refill:  3    Order Specific Question:  Supervising Provider    Answer:  DOOLITTLE, ROBERT P [id number]  . amLODipine (NORVASC) 5 MG tablet    Sig: Take 1 tablet (5 mg total) by mouth daily.    Dispense:  90 tablet    Refill:  3    Order Specific Question:  Supervising Provider    Answer:  Leandrew Koyanagi [id number]    Return in about 6 months (around 05/06/2016) for wellness exam and re-evaluation, fasting labs.   Fara Chute, PA-C Physician Assistant-Certified [facility]

## 2015-11-07 ENCOUNTER — Telehealth: Payer: Self-pay | Admitting: Emergency Medicine

## 2015-11-07 NOTE — Telephone Encounter (Signed)
-----   Message from [place], [place] sent at 11/06/2015  4:09 PM EDT ----- Please call this patient. Potassium is a smidge low, red blood cells are a smidge high. Make sure to be drinking plenty of water. We will recheck in 6 months.

## 2015-11-07 NOTE — Telephone Encounter (Signed)
Left message with results and instructions to call back for questions or concerns

## 2015-11-11 ENCOUNTER — Encounter: Payer: Self-pay | Admitting: *Deleted

## 2016-01-25 MED FILL — CHLORTHALIDONE 50 MG TABLET: 50 | 30 days supply | Qty: 60 | Fill #1

## 2016-02-23 MED FILL — AMLODIPINE BESYLATE 5 MG TA: 5 | 90 days supply | Qty: 90 | Fill #1

## 2016-03-04 MED FILL — CHLORTHALIDONE 50 MG TABLET: 50 | 90 days supply | Qty: 180 | Fill #2

## 2016-04-18 ENCOUNTER — Ambulatory Visit (INDEPENDENT_AMBULATORY_CARE_PROVIDER_SITE_OTHER): Payer: Self-pay | Admitting: Urgent Care

## 2016-04-18 VITALS — BP 130/84 | HR 72 | Temp 98.8°F | Resp 16 | Ht 65.0 in | Wt 194.0 lb

## 2016-04-18 DIAGNOSIS — I10 Essential (primary) hypertension: Secondary | ICD-10-CM

## 2016-04-18 DIAGNOSIS — Z024 Encounter for examination for driving license: Secondary | ICD-10-CM

## 2016-04-18 NOTE — Progress Notes (Signed)
[facility] Medical Examination   Peggy Church is a 60 y.o. female who presents today for a DOT physical exam. The patient reports history of HTN. Denies dizziness, chronic headache, blurred vision, chest pain, shortness of breath, heart racing, palpitations, nausea, vomiting, abdominal pain, hematuria, lower leg swelling. Denies smoking cigarettes or drinking alcohol.   The following portions of the patient's history were reviewed and updated as appropriate: allergies, current medications, past family history, past medical history, past social history and past surgical history.  Objective:   BP 130/84 (BP Location: Right Arm, Patient Position: Sitting, Cuff Size: Large)   Pulse 72   Temp 98.8 F (37.1 C)   Resp 16   Ht 5\' 5"  (1.651 m)   Wt 194 lb (88 kg)   SpO2 96%   BMI 32.28 kg/m   Vision/hearing:  Visual Acuity Screening   Right eye Left eye Both eyes  Without correction:     With correction: 20/25 20/20 20/20   Comments: Peripheral Vision: Right eye 70 degrees. Left eye 70 degrees.  Hearing Screening Comments: 10 ft Whisper   Patient can recognize and distinguish among traffic control signals and devices showing standard red, green, and amber colors.  Corrective lenses required: Yes  Monocular Vision?: No  Hearing aid requirement: No  Physical Exam  Constitutional: She is oriented to person, place, and time. She appears well-developed and well-nourished.  HENT:  TM's intact bilaterally, no effusions or erythema. Nasal turbinates pink and moist, nasal passages patent. No sinus tenderness. Oropharynx clear, mucous membranes moist, dentition in good repair.  Eyes: Conjunctivae and EOM are normal. Pupils are equal, round, and reactive to light. Right eye exhibits no discharge. Left eye exhibits no discharge. No scleral icterus.  Neck: Normal range of motion. Neck supple. No thyromegaly present.  Cardiovascular: Normal rate, regular rhythm and intact distal  pulses.  Exam reveals no gallop and no friction rub.   No murmur heard. Pulmonary/Chest: No respiratory distress. She has no wheezes. She has no rales.  Abdominal: Soft. Bowel sounds are normal. She exhibits no distension and no mass. There is no tenderness.  Musculoskeletal: Normal range of motion. She exhibits no edema or tenderness.  Lymphadenopathy:    She has no cervical adenopathy.  Neurological: She is alert and oriented to person, place, and time. She has normal reflexes.  Skin: Skin is warm and dry. No rash noted. No erythema. No pallor.  Psychiatric: She has a normal mood and affect.   Labs: Comments: SG: 1.015, BLO: Neg, PRO: Neg, GLU: Neg  Assessment:    Healthy female exam.  Meets standards, but periodic monitoring required due to HTN.  Driver qualified only for 1 year.    Plan:   Medical examiners certificate completed and printed. Return as needed.

## 2016-04-18 NOTE — Patient Instructions (Addendum)
Hypertension Hypertension, commonly called high blood pressure, is when the force of blood pumping through your arteries is too strong. Your arteries are the blood vessels that carry blood from your heart throughout your body. A blood pressure reading consists of a higher number over a lower number, such as 110/72. The higher number (systolic) is the pressure inside your arteries when your heart pumps. The lower number (diastolic) is the pressure inside your arteries when your heart relaxes. Ideally you want your blood pressure below 120/80. Hypertension forces your heart to work harder to pump blood. Your arteries may become narrow or stiff. Having untreated or uncontrolled hypertension can cause heart attack, stroke, kidney disease, and other problems. What increases the risk? Some risk factors for high blood pressure are controllable. Others are not. Risk factors you cannot control include:  Race. You may be at higher risk if you are African American.  Age. Risk increases with age.  Gender. Men are at higher risk than women before age 45 years. After age 65, women are at higher risk than men. Risk factors you can control include:  Not getting enough exercise or physical activity.  Being overweight.  Getting too much fat, sugar, calories, or salt in your diet.  Drinking too much alcohol. What are the signs or symptoms? Hypertension does not usually cause signs or symptoms. Extremely high blood pressure (hypertensive crisis) may cause headache, anxiety, shortness of breath, and nosebleed. How is this diagnosed? To check if you have hypertension, your health care provider will measure your blood pressure while you are seated, with your arm held at the level of your heart. It should be measured at least twice using the same arm. Certain conditions can cause a difference in blood pressure between your right and left arms. A blood pressure reading that is higher than normal on one occasion does  not mean that you need treatment. If it is not clear whether you have high blood pressure, you may be asked to return on a different day to have your blood pressure checked again. Or, you may be asked to monitor your blood pressure at home for 1 or more weeks. How is this treated? Treating high blood pressure includes making lifestyle changes and possibly taking medicine. Living a healthy lifestyle can help lower high blood pressure. You may need to change some of your habits. Lifestyle changes may include:  Following the DASH diet. This diet is high in fruits, vegetables, and whole grains. It is low in salt, red meat, and added sugars.  Keep your sodium intake below 2,300 mg per day.  Getting at least 30-45 minutes of aerobic exercise at least 4 times per week.  Losing weight if necessary.  Not smoking.  Limiting alcoholic beverages.  Learning ways to reduce stress. Your health care provider may prescribe medicine if lifestyle changes are not enough to get your blood pressure under control, and if one of the following is true:  You are 18-59 years of age and your systolic blood pressure is above 140.  You are 60 years of age or older, and your systolic blood pressure is above 150.  Your diastolic blood pressure is above 90.  You have diabetes, and your systolic blood pressure is over 140 or your diastolic blood pressure is over 90.  You have kidney disease and your blood pressure is above 140/90.  You have heart disease and your blood pressure is above 140/90. Your personal target blood pressure may vary depending on your medical   conditions, your age, and other factors. Follow these instructions at home:  Have your blood pressure rechecked as directed by your health care provider.  Take medicines only as directed by your health care provider. Follow the directions carefully. Blood pressure medicines must be taken as prescribed. The medicine does not work as well when you skip  doses. Skipping doses also puts you at risk for problems.  Do not smoke.  Monitor your blood pressure at home as directed by your health care provider. Contact a health care provider if:  You think you are having a reaction to medicines taken.  You have recurrent headaches or feel dizzy.  You have swelling in your ankles.  You have trouble with your vision. Get help right away if:  You develop a severe headache or confusion.  You have unusual weakness, numbness, or feel faint.  You have severe chest or abdominal pain.  You vomit repeatedly.  You have trouble breathing. This information is not intended to replace advice given to you by your health care provider. Make sure you discuss any questions you have with your health care provider. Document Released: 04/25/2005 Document Revised: 10/01/2015 Document Reviewed: 02/15/2013 Elsevier Interactive Patient Education  2017 [employer].     IF you received an x-ray today, you will receive an invoice from [facility]. Please contact [facility] at [phone] with questions or concerns regarding your invoice.   IF you received labwork today, you will receive an invoice from [employer]. Please contact [facility] at [phone] with questions or concerns regarding your invoice.   Our billing staff will not be able to assist you with questions regarding bills from these companies.  You will be contacted with the lab results as soon as they are available. The fastest way to get your results is to activate your My Chart account. Instructions are located on the last page of this paperwork. If you have not heard from [place] regarding the results in 2 weeks, please contact this office.

## 2016-05-10 ENCOUNTER — Ambulatory Visit (INDEPENDENT_AMBULATORY_CARE_PROVIDER_SITE_OTHER): Payer: BC Managed Care – PPO | Admitting: Family Medicine

## 2016-05-10 VITALS — BP 126/90 | HR 92 | Temp 98.3°F | Resp 16 | Ht 65.0 in | Wt 193.0 lb

## 2016-05-10 DIAGNOSIS — H0014 Chalazion left upper eyelid: Secondary | ICD-10-CM | POA: Diagnosis not present

## 2016-05-10 DIAGNOSIS — H02846 Edema of left eye, unspecified eyelid: Secondary | ICD-10-CM

## 2016-05-10 MED ORDER — DOXYCYCLINE HYCLATE 100 MG PO TABS: 100.0000 mg | ORAL_TABLET | Freq: Two times a day (BID) | ORAL | 0 refills | Status: DC

## 2016-05-10 NOTE — Patient Instructions (Addendum)
Your eyelid appears to be due to a condition called chalazion. That usually is not infectious, but if redness and swelling is not improved tomorrow, start antibiotic to cover for possible cellulitis or infection of that eyelid. You can apply warm compresses 4-5 times per day. Return to the clinic or go to the nearest emergency room if any of your symptoms worsen or new symptoms occur.  Chalazion Introduction A chalazion is a swelling or lump on the eyelid. It can affect the upper or lower eyelid. What are the causes? This condition may be caused by:  Long-lasting (chronic) inflammation of the eyelid glands.  A blocked oil gland in the eyelid. What are the signs or symptoms? Symptoms of this condition include:  A swelling on the eyelid. The swelling may spread to areas around the eye.  A hard lump on the eyelid. This lump may make it hard to see out of the eye. How is this diagnosed? This condition is diagnosed with an examination of the eye. How is this treated? This condition is treated by applying a warm compress to the eyelid. If the condition does not improve after two days, it may be treated with:  Surgery.  Medicine that is injected into the chalazion by a health care provider.  Medicine that is applied to the eye. Follow these instructions at home:  Do not touch the chalazion.  Do not try to remove the pus, such as by squeezing the chalazion or sticking it with a pin or needle.  Do not rub your eyes.  Wash your hands often. Dry your hands with a clean towel.  Keep your face, scalp, and eyebrows clean.  Avoid wearing eye makeup.  Apply a warm, moist compress to the eyelid 4-6 times a day for 10-15 minutes at a time. This will help to open any blocked glands and help to reduce redness and swelling.  Apply over-the-counter and prescription medicines only as told by your health care provider.  If the chalazion does not break open (rupture) on its own in a month, return  to your health care provider.  Keep all follow-up appointments as told by your health care provider. This is important. Contact a health care provider if:  Your eyelid has not improved in 4 weeks.  Your eyelid is getting worse.  You have a fever.  The chalazion does not rupture on its own with home treatment in a month. Get help right away if:  You have pain in your eye.  Your vision changes.  The chalazion becomes painful or red  The chalazion gets bigger. This information is not intended to replace advice given to you by your health care provider. Make sure you discuss any questions you have with your health care provider. Document Released: 04/22/2000 Document Revised: 10/01/2015 Document Reviewed: 08/18/2014  2017 Elsevier     IF you received an x-ray today, you will receive an invoice from [facility]. Please contact [facility] Radiology at [phone] with questions or concerns regarding your invoice.   IF you received labwork today, you will receive an invoice from [facility]. Please contact [facility] at [phone] with questions or concerns regarding your invoice.   Our billing staff will not be able to assist you with questions regarding bills from these companies.  You will be contacted with the lab results as soon as they are available. The fastest way to get your results is to activate your My Chart account. Instructions are located on the last page of this paperwork.  If you have not heard from [place] regarding the results in 2 weeks, please contact this office.

## 2016-05-10 NOTE — Progress Notes (Addendum)
Subjective:  By signing my name below, I, Peggy Church, attest that this documentation has been prepared under the direction and in the presence of Peggy Ray, MD. Electronically Signed: Moises Church, [place]. 05/10/2016 , 3:37 PM .  Patient was seen in Room 11 .   Patient ID: Peggy Church, female    DOB: [birth year], 61 y.o.   MRN: [id number] Chief Complaint  Patient presents with  . Belepharitis    LEFT EYE   HPI Peggy Church is a 61 y.o. female  Patient complains of swollen left eye lid that started 2 days ago. She felt some pain in her left eye and felt it being sore with touch. When she looked in the mirror, she noticed her left eye lid slightly swollen. Her eye progressively became more swollen yesterday, but slightly better today. She denies having similar symptoms in the past. She denies seeing any styes or bumps. She denies any visual changes.    Visual Acuity Screening   Right eye Left eye Both eyes  Without correction:     With correction: 20/25 20/30 -1 20/20   She has prescription contact lenses but hasn't worn since summer 2016.   Patient Active Problem List   Diagnosis Date Noted  . BMI 31.0-31.9,adult 11/05/2015  . HTN (hypertension) 06/26/2012   Past Medical History:  Diagnosis Date  . Allergy   . Hypertension   . Migraine    Past Surgical History:  Procedure Laterality Date  . [place]   stretched  . ECTOPIC PREGNANCY SURGERY    . TONSILLECTOMY AND ADENOIDECTOMY    . TUBAL LIGATION     Allergies  Allergen Reactions  . Benicar [Olmesartan]     Smells and senses things that aren't really there.  . Lisinopril     cough  . Sulfa Drugs Cross Reactors    Prior to Admission medications   Medication Sig Start Date End Date Taking? Authorizing Provider  amLODipine (NORVASC) 5 MG tablet Take 1 tablet (5 mg total) by mouth daily. 11/05/15  Yes Peggy Jeffery, PA-C  chlorthalidone (HYGROTON) 50 MG tablet Take 2 tablets (100 mg total) by  mouth daily. 11/05/15  Yes Peggy Jeffery, PA-C  Multiple Vitamin (MULTI-VITAMINS) TABS Take by mouth daily.   Yes Historical Provider, MD   Social History   Social History  . Marital status: Divorced    Spouse name: n/a  . Number of children: 3  . Years of education: college   Occupational History  . teaching assistant     [place] History Main Topics  . Smoking status: Former Research scientist (life sciences)  . Smokeless tobacco: Never Used  . Alcohol use 0.0 oz/week     Comment: rarely  . Drug use: No  . Sexual activity: Not on file   Other Topics Concern  . Not on file   Social History Narrative   Lives with her daughter and fiance.   Review of Systems  Constitutional: Negative for chills, fatigue, fever and unexpected weight change.  Eyes: Positive for pain (left eye lid).       Left eye lid swelling   Respiratory: Negative for cough.   Gastrointestinal: Negative for constipation, diarrhea, nausea and vomiting.  Skin: Negative for rash and wound.  Neurological: Negative for dizziness, weakness and headaches.       Objective:   Physical Exam  Constitutional: She is oriented to person, place, and time. She appears well-developed and well-nourished. No distress.  HENT:  Head:  Normocephalic and atraumatic.  Eyes: Conjunctivae and EOM are normal. Pupils are equal, round, and reactive to light. Right conjunctiva is not injected. Left conjunctiva is not injected.  Left upper lid is erythematous with some soft tissue swelling, primarily at the middle of the lid; no periorbital swelling or erythema, no pain with EOM testing, anterior chamber clear, no exudate at canthi, no stye identified; some guarding where I was unable to see the undersurface of that lid  Neck: Neck supple.  Cardiovascular: Normal rate.   Pulmonary/Chest: Effort normal. No respiratory distress.  Musculoskeletal: Normal range of motion.  Lymphadenopathy:       Head (right side): No preauricular adenopathy  present.       Head (left side): No preauricular adenopathy present.  Neurological: She is alert and oriented to person, place, and time.  Skin: Skin is warm and dry.  Psychiatric: She has a normal mood and affect. Her behavior is normal.  Nursing note and vitals reviewed.   Vitals:   05/10/16 1420  BP: 126/90  Pulse: 92  Resp: 16  Temp: 98.3 F (36.8 C)  TempSrc: Oral  SpO2: 98%  Weight: 193 lb (87.5 kg)  Height: 5\' 5"  (1.651 m)      Assessment & Plan:   Peggy Church is a 61 y.o. female Swelling of left eyelid - Plan: doxycycline (VIBRA-TABS) 100 MG tablet  Chalazion of left upper eyelid - Plan: doxycycline (VIBRA-TABS) 100 MG tablet  Suspected chalazion, with some interval improvement today per her report. Differential includes cellulitis of upper lid, but no signs of orbital cellulitis.   -warm compresses today and tomorrow, and if redness/swelling not improving, can start doxycycline for secondary coverage.   - RTC precautions if spreading redness or swelling, or other worsening symptoms. Discussed potential for persistent nodule that may require ophthalmology evaluation with chalazion.   - RTC precautions.   Meds ordered this encounter  Medications  . doxycycline (VIBRA-TABS) 100 MG tablet    Sig: Take 1 tablet (100 mg total) by mouth 2 (two) times daily.    Dispense:  14 tablet    Refill:  0   Patient Instructions   Your eyelid appears to be due to a condition called chalazion. That usually is not infectious, but if redness and swelling is not improved tomorrow, start antibiotic to cover for possible cellulitis or infection of that eyelid. You can apply warm compresses 4-5 times per day. Return to the clinic or go to the nearest emergency room if any of your symptoms worsen or new symptoms occur.  Chalazion Introduction A chalazion is a swelling or lump on the eyelid. It can affect the upper or lower eyelid. What are the causes? This condition may be caused  by:  Long-lasting (chronic) inflammation of the eyelid glands.  A blocked oil gland in the eyelid. What are the signs or symptoms? Symptoms of this condition include:  A swelling on the eyelid. The swelling may spread to areas around the eye.  A hard lump on the eyelid. This lump may make it hard to see out of the eye. How is this diagnosed? This condition is diagnosed with an examination of the eye. How is this treated? This condition is treated by applying a warm compress to the eyelid. If the condition does not improve after two days, it may be treated with:  Surgery.  Medicine that is injected into the chalazion by a health care provider.  Medicine that is applied to the eye. Follow these  instructions at home:  Do not touch the chalazion.  Do not try to remove the pus, such as by squeezing the chalazion or sticking it with a pin or needle.  Do not rub your eyes.  Wash your hands often. Dry your hands with a clean towel.  Keep your face, scalp, and eyebrows clean.  Avoid wearing eye makeup.  Apply a warm, moist compress to the eyelid 4-6 times a day for 10-15 minutes at a time. This will help to open any blocked glands and help to reduce redness and swelling.  Apply over-the-counter and prescription medicines only as told by your health care provider.  If the chalazion does not break open (rupture) on its own in a month, return to your health care provider.  Keep all follow-up appointments as told by your health care provider. This is important. Contact a health care provider if:  Your eyelid has not improved in 4 weeks.  Your eyelid is getting worse.  You have a fever.  The chalazion does not rupture on its own with home treatment in a month. Get help right away if:  You have pain in your eye.  Your vision changes.  The chalazion becomes painful or red  The chalazion gets bigger. This information is not intended to replace advice given to you by your  health care provider. Make sure you discuss any questions you have with your health care provider. Document Released: 04/22/2000 Document Revised: 10/01/2015 Document Reviewed: 08/18/2014  2017 Elsevier     IF you received an x-Church today, you will receive an invoice from [facility] Radiology. Please contact [facility] Radiology at [phone] with questions or concerns regarding your invoice.   IF you received labwork today, you will receive an invoice from [place]. Please contact [facility] at [phone] with questions or concerns regarding your invoice.   Our billing staff will not be able to assist you with questions regarding bills from these companies.  You will be contacted with the Church results as soon as they are available. The fastest way to get your results is to activate your My Chart account. Instructions are located on the last page of this paperwork. If you have not heard from [place] regarding the results in 2 weeks, please contact this office.       I personally performed the services described in this documentation, which was scribed in my presence. The recorded information has been reviewed and considered, and addended by me as needed.   Signed,   Peggy Ray, MD [facility].  05/10/16 3:44 PM

## 2016-06-17 MED FILL — AMLODIPINE BESYLATE 5 MG TA: 5 | 90 days supply | Qty: 90 | Fill #2

## 2016-07-19 ENCOUNTER — Ambulatory Visit: Payer: Self-pay

## 2016-07-20 ENCOUNTER — Telehealth: Payer: Self-pay | Admitting: *Deleted

## 2016-07-20 ENCOUNTER — Encounter: Payer: Self-pay | Admitting: Physician Assistant

## 2016-07-20 ENCOUNTER — Ambulatory Visit (INDEPENDENT_AMBULATORY_CARE_PROVIDER_SITE_OTHER): Payer: Self-pay | Admitting: Physician Assistant

## 2016-07-20 VITALS — BP 124/80 | HR 90 | Temp 98.6°F | Resp 18 | Ht 65.0 in | Wt 202.0 lb

## 2016-07-20 DIAGNOSIS — Z029 Encounter for administrative examinations, unspecified: Secondary | ICD-10-CM

## 2016-07-20 NOTE — Progress Notes (Signed)
07/20/2016 5:24 PM   DOB: [birth year] / MRN: [id number]  SUBJECTIVE:  Peggy Church is a 61 y.o. female presenting for DMV physical.  She had a normal DOT physical with PA Mani on 04/18/16 and was given a one year card due to a history of HTN.  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    Current Outpatient Prescriptions:  .  amLODipine (NORVASC) 5 MG tablet, Take 1 tablet (5 mg total) by mouth daily., Disp: 90 tablet, Rfl: 3 .  chlorthalidone (HYGROTON) 50 MG tablet, Take 2 tablets (100 mg total) by mouth daily., Disp: 180 tablet, Rfl: 3 .  Multiple Vitamin (MULTI-VITAMINS) TABS, Take by mouth daily., Disp: , Rfl:    She is allergic to benicar [olmesartan]; lisinopril; and sulfa drugs cross reactors.   She  has a past medical history of Allergy; Hypertension; and Migraine.    She  reports that she has quit smoking. She has never used smokeless tobacco. She reports that she drinks alcohol. She reports that she does not use drugs. She  has no sexual activity history on file. The patient  has a past surgical history that includes Tubal ligation; Ectopic pregnancy surgery; Tonsillectomy and adenoidectomy; and Bladder surgery (1963).  Her family history includes Cancer in her brother and father; Heart disease in her mother; Hyperlipidemia in her father and mother; Hypertension in her father and mother; Stroke in her mother.  Review of Systems  Constitutional: Negative for fever.  Eyes: Negative.   Respiratory: Negative for cough and shortness of breath.   Cardiovascular: Negative for chest pain and leg swelling.  Gastrointestinal: Negative for nausea.  Neurological: Negative for dizziness and headaches.    The problem list and medications were reviewed and updated by myself where necessary and exist elsewhere in the encounter.   OBJECTIVE:  BP 124/80 (BP Location:  Right Arm, Patient Position: Sitting, Cuff Size: Large)   Pulse 90   Temp 98.6 F (37 C) (Oral)   Resp 18   Ht 5\' 5"  (1.651 m)   Wt 202 lb (91.6 kg)   SpO2 96%   BMI 33.61 kg/m   Physical Exam  Constitutional: She is oriented to person, place, and time. No distress.  Cardiovascular: Regular rhythm, normal heart sounds and intact distal pulses.   Pulmonary/Chest: Effort normal and breath sounds normal.  Musculoskeletal: Normal range of motion.  Neurological: She is alert and oriented to person, place, and time. She has normal reflexes.  Skin: Skin is warm and dry. She is not diaphoretic.  Psychiatric: She has a normal mood and affect. Her behavior is normal.    No results found for this or any previous visit (from the past 72 hour(s)).  No results found.   Visual Acuity Screening   Right eye Left eye Both eyes  Without correction:     With correction: 20/30 20/30 20/30      ASSESSMENT AND PLAN:  [name] was seen today for dmv physical.  Diagnoses and all orders for this visit:  Administrative encounter: She has a history of well controlled HTN but is medication compliant and otherwise healthy.  A reasonable chart review reveals no red flags. 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     The patient is advised to call or return to clinic if she does not see  an improvement in symptoms, or to seek the care of the closest emergency department if she worsens with the above plan.   Philis Fendt, MHS, PA-C [facility] 07/20/2016 5:24 PM

## 2016-07-20 NOTE — Telephone Encounter (Signed)
Faxed completed documents to [facility] [employer], per patient and she waited for the confirmation page. After receiving confirmation I gave to her for her records. The original documents were put in for scanning.

## 2016-07-20 NOTE — Patient Instructions (Signed)
     IF you received an x-ray today, you will receive an invoice from [facility]. Please contact [facility] at [phone] with questions or concerns regarding your invoice.   IF you received labwork today, you will receive an invoice from [facility]. Please contact [facility] at [phone] with questions or concerns regarding your invoice.   Our billing staff will not be able to assist you with questions regarding bills from these companies.  You will be contacted with the lab results as soon as they are available. The fastest way to get your results is to activate your My Chart account. Instructions are located on the last page of this paperwork. If you have not heard from us regarding the results in 2 weeks, please contact this office.     

## 2016-10-10 MED FILL — AMLODIPINE BESYLATE 5 MG TA: 5 | 90 days supply | Qty: 90 | Fill #3

## 2016-10-10 MED FILL — CHLORTHALIDONE 50 MG TABLET: 50 | 90 days supply | Qty: 180 | Fill #3

## 2017-01-15 ENCOUNTER — Other Ambulatory Visit: Payer: Self-pay | Admitting: Physician Assistant

## 2017-01-15 DIAGNOSIS — I10 Essential (primary) hypertension: Secondary | ICD-10-CM

## 2017-01-16 ENCOUNTER — Encounter: Payer: Self-pay | Admitting: Family Medicine

## 2017-01-16 ENCOUNTER — Other Ambulatory Visit: Payer: Self-pay | Admitting: Physician Assistant

## 2017-01-16 ENCOUNTER — Ambulatory Visit (INDEPENDENT_AMBULATORY_CARE_PROVIDER_SITE_OTHER): Payer: BC Managed Care – PPO | Admitting: Family Medicine

## 2017-01-16 VITALS — BP 122/69 | HR 70 | Temp 98.0°F | Resp 16 | Ht 65.35 in | Wt 199.0 lb

## 2017-01-16 DIAGNOSIS — I10 Essential (primary) hypertension: Secondary | ICD-10-CM

## 2017-01-16 DIAGNOSIS — M5412 Radiculopathy, cervical region: Secondary | ICD-10-CM | POA: Diagnosis not present

## 2017-01-16 DIAGNOSIS — M79602 Pain in left arm: Secondary | ICD-10-CM

## 2017-01-16 MED ORDER — CYCLOBENZAPRINE HCL 5 MG PO TABS: ORAL_TABLET | ORAL | 0 refills | Status: DC

## 2017-01-16 MED ORDER — AMLODIPINE BESYLATE 5 MG PO TABS: 5.0000 mg | ORAL_TABLET | Freq: Every day | ORAL | 1 refills | Status: DC

## 2017-01-16 MED ORDER — CHLORTHALIDONE 50 MG PO TABS: 100.0000 mg | ORAL_TABLET | Freq: Every day | ORAL | 1 refills | Status: DC

## 2017-01-16 MED FILL — AMLODIPINE BESYLATE 5 MG TA: 5 | 90 days supply | Qty: 90 | Fill #0

## 2017-01-16 NOTE — Patient Instructions (Addendum)
No change in meds for now. If you continue to have swelling in your legs, could consider other medication besides amlodipine. I will check your electrolytes and kidney function, please follow-up in the next few months for a physical. We can check cholesterol that time.  Left upper arm symptoms may be related to cervical nerves or "cervical radiculopathy".  Range of motion of the neck, heat, gentle stretches, and occasional Tylenol is fine for now. You could try occasional ibuprofen, but that can also increase blood pressure, so start with Tylenol first. If needed, I did prescribe a muscle relaxer, but try to take that at bedtime and lowest effective dose as it can cause dizziness and sedation. Return to the clinic or go to the nearest emergency room if any of your symptoms worsen or new symptoms occur.   Cervical Radiculopathy Cervical radiculopathy happens when a nerve in the neck (cervical nerve) is pinched or bruised. This condition can develop because of an injury or as part of the normal aging process. Pressure on the cervical nerves can cause pain or numbness that runs from the neck all the way down into the arm and fingers. Usually, this condition gets better with rest. Treatment may be needed if the condition does not improve. What are the causes? This condition may be caused by:  Injury.  Slipped (herniated) disk.  Muscle tightness in the neck because of overuse.  Arthritis.  Breakdown or degeneration in the bones and joints of the spine (spondylosis) due to aging.  Bone spurs that may develop near the cervical nerves.  What are the signs or symptoms? Symptoms of this condition include:  Pain that runs from the neck to the arm and hand. The pain can be severe or irritating. It may be worse when the neck is moved.  Numbness or weakness in the affected arm and hand.  How is this diagnosed? This condition may be diagnosed based on symptoms, medical history, and a physical  exam. You may also have tests, including:  X-rays.  CT scan.  MRI.  Electromyogram (EMG).  Nerve conduction tests.  How is this treated? In many cases, treatment is not needed for this condition. With rest, the condition usually gets better over time. If treatment is needed, options may include:  Wearing a soft neck collar for short periods of time.  Physical therapy to strengthen your neck muscles.  Medicines, such as NSAIDs, oral corticosteroids, or spinal injections.  Surgery. This may be needed if other treatments do not help. Various types of surgery may be done depending on the cause of your problems.  Follow these instructions at home: Managing pain  Take over-the-counter and prescription medicines only as told by your health care provider.  If directed, apply ice to the affected area. ? Put ice in a plastic bag. ? Place a towel between your skin and the bag. ? Leave the ice on for 20 minutes, 2-3 times per day.  If ice does not help, you can try using heat. Take a warm shower or warm bath, or use a heat pack as told by your health care provider.  Try a gentle neck and shoulder massage to help relieve symptoms. Activity  Rest as needed. Follow instructions from your health care provider about any restrictions on activities.  Do stretching and strengthening exercises as told by your health care provider or physical therapist. General instructions  If you were given a soft collar, wear it as told by your health care provider.  Use a flat pillow when you sleep.  Keep all follow-up visits as told by your health care provider. This is important. Contact a health care provider if:  Your condition does not improve with treatment. Get help right away if:  Your pain gets much worse and cannot be controlled with medicines.  You have weakness or numbness in your hand, arm, face, or leg.  You have a high fever.  You have a stiff, rigid neck.  You lose control  of your bowels or your bladder (have incontinence).  You have trouble with walking, balance, or speaking. This information is not intended to replace advice given to you by your health care provider. Make sure you discuss any questions you have with your health care provider. Document Released: 01/18/2001 Document Revised: 10/01/2015 Document Reviewed: 06/19/2014 Elsevier Interactive Patient Education  2018 [employer].  Hypertension Hypertension, commonly called high blood pressure, is when the force of blood pumping through the arteries is too strong. The arteries are the blood vessels that carry blood from the heart throughout the body. Hypertension forces the heart to work harder to pump blood and may cause arteries to become narrow or stiff. Having untreated or uncontrolled hypertension can cause heart attacks, strokes, kidney disease, and other problems. A blood pressure reading consists of a higher number over a lower number. Ideally, your blood pressure should be below 120/80. The first ("top") number is called the systolic pressure. It is a measure of the pressure in your arteries as your heart beats. The second ("bottom") number is called the diastolic pressure. It is a measure of the pressure in your arteries as the heart relaxes. What are the causes? The cause of this condition is not known. What increases the risk? Some risk factors for high blood pressure are under your control. Others are not. Factors you can change  Smoking.  Having type 2 diabetes mellitus, high cholesterol, or both.  Not getting enough exercise or physical activity.  Being overweight.  Having too much fat, sugar, calories, or salt (sodium) in your diet.  Drinking too much alcohol. Factors that are difficult or impossible to change  Having chronic kidney disease.  Having a family history of high blood pressure.  Age. Risk increases with age.  Race. You may be at higher risk if you are  African-American.  Gender. Men are at higher risk than women before age 11. After age 70, women are at higher risk than men.  Having obstructive sleep apnea.  Stress. What are the signs or symptoms? Extremely high blood pressure (hypertensive crisis) may cause:  Headache.  Anxiety.  Shortness of breath.  Nosebleed.  Nausea and vomiting.  Severe chest pain.  Jerky movements you cannot control (seizures).  How is this diagnosed? This condition is diagnosed by measuring your blood pressure while you are seated, with your arm resting on a surface. The cuff of the blood pressure monitor will be placed directly against the skin of your upper arm at the level of your heart. It should be measured at least twice using the same arm. Certain conditions can cause a difference in blood pressure between your right and left arms. Certain factors can cause blood pressure readings to be lower or higher than normal (elevated) for a short period of time:  When your blood pressure is higher when you are in a health care provider's office than when you are at home, this is called white coat hypertension. Most people with this condition do not need  medicines.  When your blood pressure is higher at home than when you are in a health care provider's office, this is called masked hypertension. Most people with this condition may need medicines to control blood pressure.  If you have a high blood pressure reading during one visit or you have normal blood pressure with other risk factors:  You may be asked to return on a different day to have your blood pressure checked again.  You may be asked to monitor your blood pressure at home for 1 week or longer.  If you are diagnosed with hypertension, you may have other blood or imaging tests to help your health care provider understand your overall risk for other conditions. How is this treated? This condition is treated by making healthy lifestyle changes,  such as eating healthy foods, exercising more, and reducing your alcohol intake. Your health care provider may prescribe medicine if lifestyle changes are not enough to get your blood pressure under control, and if:  Your systolic blood pressure is above 130.  Your diastolic blood pressure is above 80.  Your personal target blood pressure may vary depending on your medical conditions, your age, and other factors. Follow these instructions at home: Eating and drinking  Eat a diet that is high in fiber and potassium, and low in sodium, added sugar, and fat. An example eating plan is called the DASH (Dietary Approaches to Stop Hypertension) diet. To eat this way: ? Eat plenty of fresh fruits and vegetables. Try to fill half of your plate at each meal with fruits and vegetables. ? Eat whole grains, such as whole wheat pasta, brown rice, or whole grain bread. Fill about one quarter of your plate with whole grains. ? Eat or drink low-fat dairy products, such as skim milk or low-fat yogurt. ? Avoid fatty cuts of meat, processed or cured meats, and poultry with skin. Fill about one quarter of your plate with lean proteins, such as fish, chicken without skin, beans, eggs, and tofu. ? Avoid premade and processed foods. These tend to be higher in sodium, added sugar, and fat.  Reduce your daily sodium intake. Most people with hypertension should eat less than 1,500 mg of sodium a day.  Limit alcohol intake to no more than 1 drink a day for nonpregnant women and 2 drinks a day for men. One drink equals 12 oz of beer, 5 oz of wine, or 1 oz of hard liquor. Lifestyle  Work with your health care provider to maintain a healthy body weight or to lose weight. Ask what an ideal weight is for you.  Get at least 30 minutes of exercise that causes your heart to beat faster (aerobic exercise) most days of the week. Activities may include walking, swimming, or biking.  Include exercise to strengthen your muscles  (resistance exercise), such as pilates or lifting weights, as part of your weekly exercise routine. Try to do these types of exercises for 30 minutes at least 3 days a week.  Do not use any products that contain nicotine or tobacco, such as cigarettes and e-cigarettes. If you need help quitting, ask your health care provider.  Monitor your blood pressure at home as told by your health care provider.  Keep all follow-up visits as told by your health care provider. This is important. Medicines  Take over-the-counter and prescription medicines only as told by your health care provider. Follow directions carefully. Blood pressure medicines must be taken as prescribed.  Do not skip doses  of blood pressure medicine. Doing this puts you at risk for problems and can make the medicine less effective.  Ask your health care provider about side effects or reactions to medicines that you should watch for. Contact a health care provider if:  You think you are having a reaction to a medicine you are taking.  You have headaches that keep coming back (recurring).  You feel dizzy.  You have swelling in your ankles.  You have trouble with your vision. Get help right away if:  You develop a severe headache or confusion.  You have unusual weakness or numbness.  You feel faint.  You have severe pain in your chest or abdomen.  You vomit repeatedly.  You have trouble breathing. Summary  Hypertension is when the force of blood pumping through your arteries is too strong. If this condition is not controlled, it may put you at risk for serious complications.  Your personal target blood pressure may vary depending on your medical conditions, your age, and other factors. For most people, a normal blood pressure is less than 120/80.  Hypertension is treated with lifestyle changes, medicines, or a combination of both. Lifestyle changes include weight loss, eating a healthy, low-sodium diet, exercising  more, and limiting alcohol. This information is not intended to replace advice given to you by your health care provider. Make sure you discuss any questions you have with your health care provider. Document Released: 04/25/2005 Document Revised: 03/23/2016 Document Reviewed: 03/23/2016 Elsevier Interactive Patient Education  2018 [employer].    IF you received an x-ray today, you will receive an invoice from [facility]. Please contact [facility] at [phone] with questions or concerns regarding your invoice.   IF you received labwork today, you will receive an invoice from [place]. Please contact [facility] at [phone] with questions or concerns regarding your invoice.   Our billing staff will not be able to assist you with questions regarding bills from these companies.  You will be contacted with the lab results as soon as they are available. The fastest way to get your results is to activate your My Chart account. Instructions are located on the last page of this paperwork. If you have not heard from [place] regarding the results in 2 weeks, please contact this office.

## 2017-01-16 NOTE — Progress Notes (Signed)
Subjective:    Patient ID: Peggy Church, female    DOB: Dec 27, 1955, 61 y.o.   MRN: [id number]  HPI  Peggy Church is a 61 y.o. female Presents today for: Chief Complaint  Patient presents with  . Shoulder Pain    left shoulder pain x 4 day. Pt states she felt some tingle today that radaites down to her hands.   . Hypertension    follow-up    Shoulder pain  L shoulder, slight soreness with going to bed past few nights.  Noticed tingling sensation in L hand earlier today. No chest pain, no dyspnea. Top of left shoulder into upper left arm. Felt ok otherwise .  Tingling has gone away. No weakness, no slurred speech. No new headaches. No treatments. Tingling in hand resolved on its own in 10 minutes. No new exercise, or new activities. No injuries. No neck pain. No prevoious neck/shoulder surgery   Tx: none.  Shoulder feels better now - usually at bedtime.   Hypertension Taking norvasc 5mg  qd, chlorthalidone 100mg  qd.  No missed doses.  Not checking BP often, but 118/91 on one occasion. Not fasting - lunch 4 and 1/2 hours ago.  Lab Results  Component Value Date   CREATININE 0.84 11/05/2015        Patient Active Problem List   Diagnosis Date Noted  . BMI 31.0-31.9,adult 11/05/2015  . HTN (hypertension) 06/26/2012   Past Medical History:  Diagnosis Date  . Allergy   . Hypertension   . Migraine    Past Surgical History:  Procedure Laterality Date  . [place]   stretched  . ECTOPIC PREGNANCY SURGERY    . TONSILLECTOMY AND ADENOIDECTOMY    . TUBAL LIGATION     Allergies  Allergen Reactions  . Benicar [Olmesartan]     Smells and senses things that aren't really there.  . Lisinopril     cough  . Sulfa Drugs Cross Reactors    Prior to Admission medications   Medication Sig Start Date End Date Taking? Authorizing Provider  amLODipine (NORVASC) 5 MG tablet Take 1 tablet (5 mg total) by mouth daily. 11/05/15  Yes Jeffery, Chelle, PA-C  chlorthalidone  (HYGROTON) 50 MG tablet Take 2 tablets (100 mg total) by mouth daily. 11/05/15  Yes Jeffery, Chelle, PA-C  Multiple Vitamin (MULTI-VITAMINS) TABS Take by mouth daily.   Yes [provider]   Social History   Social History  . Marital status: Divorced    Spouse name: n/a  . Number of children: 3  . Years of education: college   Occupational History  . teaching assistant     [place] History Main Topics  . Smoking status: Former Research scientist (life sciences)  . Smokeless tobacco: Never Used  . Alcohol use 0.0 oz/week     Comment: rarely  . Drug use: No  . Sexual activity: Not on file   Other Topics Concern  . Not on file   Social History Narrative   Lives with her daughter and fiance.   Review of Systems  Constitutional: Negative for fatigue and unexpected weight change.  Respiratory: Negative for chest tightness and shortness of breath.   Cardiovascular: Negative for chest pain, palpitations and leg swelling.  Gastrointestinal: Negative for abdominal pain and blood in stool.  Musculoskeletal: Positive for arthralgias (L upper arm).  Neurological: Negative for dizziness, syncope, light-headedness and headaches.         Objective:   Physical Exam  Constitutional: She is  oriented to person, place, and time. She appears well-developed and well-nourished.  HENT:  Head: Normocephalic and atraumatic.  Eyes: Pupils are equal, round, and reactive to light. Conjunctivae and EOM are normal.  Neck: Carotid bruit is not present.  Cardiovascular: Normal rate, regular rhythm, normal heart sounds and intact distal pulses.   Pulmonary/Chest: Effort normal and breath sounds normal.  Abdominal: Soft. She exhibits no pulsatile midline mass. There is no tenderness.  Musculoskeletal: Normal range of motion.       Right shoulder: Normal.       Left shoulder: Normal. She exhibits normal range of motion, no bony tenderness, no swelling, no spasm and normal strength.       Cervical back:  Normal. She exhibits normal range of motion, no tenderness, no bony tenderness, no swelling and no spasm.  Neurological: She is alert and oriented to person, place, and time.  Skin: Skin is warm and dry.  Psychiatric: She has a normal mood and affect. Her behavior is normal.  Vitals reviewed.  Vitals:   01/16/17 1639  BP: 122/69  Pulse: 70  Resp: 16  Temp: 98 F (36.7 C)  TempSrc: Oral  SpO2: 99%  Weight: 199 lb (90.3 kg)  Height: 5' 5.35" (1.66 m)       Assessment & Plan:   Peggy Church is a 61 y.o. female Left arm pain - Plan: cyclobenzaprine (FLEXERIL) 5 MG tablet Cervical radiculopathy - Plan: cyclobenzaprine (FLEXERIL) 5 MG tablet  - Possible cervical radiculopathy, overall fairly mild symptoms. Recommended heat, gentle range of motion, Tylenol over-the-counter as needed, and Flexeril if needed for spasm/more symptoms. Potential side effects and risks discussed. RTC precautions if persistent or worsening  Essential hypertension - Plan: amLODipine (NORVASC) 5 MG tablet, chlorthalidone (HYGROTON) 50 MG tablet, Basic metabolic panel  -Stable. No med changes.  Could consider change from calcium channel blocker if persistent pedal edema. BMP pending  Meds ordered this encounter  Medications  . amLODipine (NORVASC) 5 MG tablet    Sig: Take 1 tablet (5 mg total) by mouth daily.    Dispense:  90 tablet    Refill:  1  . chlorthalidone (HYGROTON) 50 MG tablet    Sig: Take 2 tablets (100 mg total) by mouth daily.    Dispense:  180 tablet    Refill:  1  . cyclobenzaprine (FLEXERIL) 5 MG tablet    Sig: 1 pill by mouth up to every 8 hours as needed. Start with one pill by mouth each bedtime as needed due to sedation    Dispense:  15 tablet    Refill:  0   Patient Instructions    No change in meds for now. If you continue to have swelling in your legs, could consider other medication besides amlodipine. I will check your electrolytes and kidney function, please follow-up in  the next few months for a physical. We can check cholesterol that time.  Left upper arm symptoms may be related to cervical nerves or "cervical radiculopathy".  Range of motion of the neck, heat, gentle stretches, and occasional Tylenol is fine for now. You could try occasional ibuprofen, but that can also increase blood pressure, so start with Tylenol first. If needed, I did prescribe a muscle relaxer, but try to take that at bedtime and lowest effective dose as it can cause dizziness and sedation. Return to the clinic or go to the nearest emergency room if any of your symptoms worsen or new symptoms occur.   Cervical Radiculopathy  Cervical radiculopathy happens when a nerve in the neck (cervical nerve) is pinched or bruised. This condition can develop because of an injury or as part of the normal aging process. Pressure on the cervical nerves can cause pain or numbness that runs from the neck all the way down into the arm and fingers. Usually, this condition gets better with rest. Treatment may be needed if the condition does not improve. What are the causes? This condition may be caused by:  Injury.  Slipped (herniated) disk.  Muscle tightness in the neck because of overuse.  Arthritis.  Breakdown or degeneration in the bones and joints of the spine (spondylosis) due to aging.  Bone spurs that may develop near the cervical nerves.  What are the signs or symptoms? Symptoms of this condition include:  Pain that runs from the neck to the arm and hand. The pain can be severe or irritating. It may be worse when the neck is moved.  Numbness or weakness in the affected arm and hand.  How is this diagnosed? This condition may be diagnosed based on symptoms, medical history, and a physical exam. You may also have tests, including:  X-rays.  CT scan.  MRI.  Electromyogram (EMG).  Nerve conduction tests.  How is this treated? In many cases, treatment is not needed for this  condition. With rest, the condition usually gets better over time. If treatment is needed, options may include:  Wearing a soft neck collar for short periods of time.  Physical therapy to strengthen your neck muscles.  Medicines, such as NSAIDs, oral corticosteroids, or spinal injections.  Surgery. This may be needed if other treatments do not help. Various types of surgery may be done depending on the cause of your problems.  Follow these instructions at home: Managing pain  Take over-the-counter and prescription medicines only as told by your health care provider.  If directed, apply ice to the affected area. ? Put ice in a plastic bag. ? Place a towel between your skin and the bag. ? Leave the ice on for 20 minutes, 2-3 times per day.  If ice does not help, you can try using heat. Take a warm shower or warm bath, or use a heat pack as told by your health care provider.  Try a gentle neck and shoulder massage to help relieve symptoms. Activity  Rest as needed. Follow instructions from your health care provider about any restrictions on activities.  Do stretching and strengthening exercises as told by your health care provider or physical therapist. General instructions  If you were given a soft collar, wear it as told by your health care provider.  Use a flat pillow when you sleep.  Keep all follow-up visits as told by your health care provider. This is important. Contact a health care provider if:  Your condition does not improve with treatment. Get help right away if:  Your pain gets much worse and cannot be controlled with medicines.  You have weakness or numbness in your hand, arm, face, or leg.  You have a high fever.  You have a stiff, rigid neck.  You lose control of your bowels or your bladder (have incontinence).  You have trouble with walking, balance, or speaking. This information is not intended to replace advice given to you by your health care  provider. Make sure you discuss any questions you have with your health care provider. Document Released: 01/18/2001 Document Revised: 10/01/2015 Document Reviewed: 06/19/2014 Elsevier Interactive Patient Education  2018 [place].  Hypertension Hypertension, commonly called high blood pressure, is when the force of blood pumping through the arteries is too strong. The arteries are the blood vessels that carry blood from the heart throughout the body. Hypertension forces the heart to work harder to pump blood and may cause arteries to become narrow or stiff. Having untreated or uncontrolled hypertension can cause heart attacks, strokes, kidney disease, and other problems. A blood pressure reading consists of a higher number over a lower number. Ideally, your blood pressure should be below 120/80. The first ("top") number is called the systolic pressure. It is a measure of the pressure in your arteries as your heart beats. The second ("bottom") number is called the diastolic pressure. It is a measure of the pressure in your arteries as the heart relaxes. What are the causes? The cause of this condition is not known. What increases the risk? Some risk factors for high blood pressure are under your control. Others are not. Factors you can change  Smoking.  Having type 2 diabetes mellitus, high cholesterol, or both.  Not getting enough exercise or physical activity.  Being overweight.  Having too much fat, sugar, calories, or salt (sodium) in your diet.  Drinking too much alcohol. Factors that are difficult or impossible to change  Having chronic kidney disease.  Having a family history of high blood pressure.  Age. Risk increases with age.  Race. You may be at higher risk if you are African-American.  Gender. Men are at higher risk than women before age 40. After age 69, women are at higher risk than men.  Having obstructive sleep apnea.  Stress. What are the signs or  symptoms? Extremely high blood pressure (hypertensive crisis) may cause:  Headache.  Anxiety.  Shortness of breath.  Nosebleed.  Nausea and vomiting.  Severe chest pain.  Jerky movements you cannot control (seizures).  How is this diagnosed? This condition is diagnosed by measuring your blood pressure while you are seated, with your arm resting on a surface. The cuff of the blood pressure monitor will be placed directly against the skin of your upper arm at the level of your heart. It should be measured at least twice using the same arm. Certain conditions can cause a difference in blood pressure between your right and left arms. Certain factors can cause blood pressure readings to be lower or higher than normal (elevated) for a short period of time:  When your blood pressure is higher when you are in a health care provider's office than when you are at home, this is called white coat hypertension. Most people with this condition do not need medicines.  When your blood pressure is higher at home than when you are in a health care provider's office, this is called masked hypertension. Most people with this condition may need medicines to control blood pressure.  If you have a high blood pressure reading during one visit or you have normal blood pressure with other risk factors:  You may be asked to return on a different day to have your blood pressure checked again.  You may be asked to monitor your blood pressure at home for 1 week or longer.  If you are diagnosed with hypertension, you may have other blood or imaging tests to help your health care provider understand your overall risk for other conditions. How is this treated? This condition is treated by making healthy lifestyle changes, such as eating healthy foods, exercising more, and reducing  your alcohol intake. Your health care provider may prescribe medicine if lifestyle changes are not enough to get your blood pressure  under control, and if:  Your systolic blood pressure is above 130.  Your diastolic blood pressure is above 80.  Your personal target blood pressure may vary depending on your medical conditions, your age, and other factors. Follow these instructions at home: Eating and drinking  Eat a diet that is high in fiber and potassium, and low in sodium, added sugar, and fat. An example eating plan is called the DASH (Dietary Approaches to Stop Hypertension) diet. To eat this way: ? Eat plenty of fresh fruits and vegetables. Try to fill half of your plate at each meal with fruits and vegetables. ? Eat whole grains, such as whole wheat pasta, brown rice, or whole grain bread. Fill about one quarter of your plate with whole grains. ? Eat or drink low-fat dairy products, such as skim milk or low-fat yogurt. ? Avoid fatty cuts of meat, processed or cured meats, and poultry with skin. Fill about one quarter of your plate with lean proteins, such as fish, chicken without skin, beans, eggs, and tofu. ? Avoid premade and processed foods. These tend to be higher in sodium, added sugar, and fat.  Reduce your daily sodium intake. Most people with hypertension should eat less than 1,500 mg of sodium a day.  Limit alcohol intake to no more than 1 drink a day for nonpregnant women and 2 drinks a day for men. One drink equals 12 oz of beer, 5 oz of wine, or 1 oz of hard liquor. Lifestyle  Work with your health care provider to maintain a healthy body weight or to lose weight. Ask what an ideal weight is for you.  Get at least 30 minutes of exercise that causes your heart to beat faster (aerobic exercise) most days of the week. Activities may include walking, swimming, or biking.  Include exercise to strengthen your muscles (resistance exercise), such as pilates or lifting weights, as part of your weekly exercise routine. Try to do these types of exercises for 30 minutes at least 3 days a week.  Do not use any  products that contain nicotine or tobacco, such as cigarettes and e-cigarettes. If you need help quitting, ask your health care provider.  Monitor your blood pressure at home as told by your health care provider.  Keep all follow-up visits as told by your health care provider. This is important. Medicines  Take over-the-counter and prescription medicines only as told by your health care provider. Follow directions carefully. Blood pressure medicines must be taken as prescribed.  Do not skip doses of blood pressure medicine. Doing this puts you at risk for problems and can make the medicine less effective.  Ask your health care provider about side effects or reactions to medicines that you should watch for. Contact a health care provider if:  You think you are having a reaction to a medicine you are taking.  You have headaches that keep coming back (recurring).  You feel dizzy.  You have swelling in your ankles.  You have trouble with your vision. Get help right away if:  You develop a severe headache or confusion.  You have unusual weakness or numbness.  You feel faint.  You have severe pain in your chest or abdomen.  You vomit repeatedly.  You have trouble breathing. Summary  Hypertension is when the force of blood pumping through your arteries is too strong. If  this condition is not controlled, it may put you at risk for serious complications.  Your personal target blood pressure may vary depending on your medical conditions, your age, and other factors. For most people, a normal blood pressure is less than 120/80.  Hypertension is treated with lifestyle changes, medicines, or a combination of both. Lifestyle changes include weight loss, eating a healthy, low-sodium diet, exercising more, and limiting alcohol. This information is not intended to replace advice given to you by your health care provider. Make sure you discuss any questions you have with your health care  provider. Document Released: 04/25/2005 Document Revised: 03/23/2016 Document Reviewed: 03/23/2016 Elsevier Interactive Patient Education  2018 [employer].    IF you received an x-ray today, you will receive an invoice from [facility]. Please contact [facility] at [phone] with questions or concerns regarding your invoice.   IF you received labwork today, you will receive an invoice from [facility]. Please contact [facility] at [phone] with questions or concerns regarding your invoice.   Our billing staff will not be able to assist you with questions regarding bills from these companies.  You will be contacted with the lab results as soon as they are available. The fastest way to get your results is to activate your My Chart account. Instructions are located on the last page of this paperwork. If you have not heard from [place] regarding the results in 2 weeks, please contact this office.       Signed,   Merri Ray, MD [facility].  01/18/17 4:49 PM

## 2017-01-17 LAB — BASIC METABOLIC PANEL
BUN / CREAT RATIO: 14 (ref 12–28)
BUN: 14 mg/dL (ref 8–27)
CALCIUM: 10.2 mg/dL (ref 8.7–10.3)
CO2: 27 mmol/L (ref 20–29)
Chloride: 96 mmol/L (ref 96–106)
Creatinine, Ser: 0.98 mg/dL (ref 0.57–1.00)
GFR, EST AFRICAN AMERICAN: 73 mL/min/{1.73_m2} (ref >59)
GFR, EST NON AFRICAN AMERICAN: 63 mL/min/{1.73_m2} (ref >59)
Glucose: 93 mg/dL (ref 65–99)
Potassium: 3.1 mmol/L — ABNORMAL LOW (ref 3.5–5.2)
Sodium: 140 mmol/L (ref 134–144)

## 2017-01-17 MED FILL — CHLORTHALIDONE 50 MG TABLET: 50 | 90 days supply | Qty: 180 | Fill #0

## 2017-01-17 MED FILL — CYCLOBENZAPRINE 5 MG TABLET: 5 | 5 days supply | Qty: 15 | Fill #0

## 2017-01-19 ENCOUNTER — Other Ambulatory Visit: Payer: Self-pay | Admitting: Family Medicine

## 2017-01-19 DIAGNOSIS — E876 Hypokalemia: Secondary | ICD-10-CM

## 2017-01-19 MED ORDER — POTASSIUM CHLORIDE ER 10 MEQ PO TBCR: 10.0000 meq | EXTENDED_RELEASE_TABLET | Freq: Every day | ORAL | 1 refills | Status: DC

## 2017-01-19 NOTE — Progress Notes (Signed)
Hypokalemia on recent blood work. Will start potassium supplement 20 mEq initially, then 10 mEq daily, lab only order for BMP in 1 week.

## 2017-04-24 MED FILL — AMLODIPINE BESYLATE 5 MG TA: 5 | 90 days supply | Qty: 90 | Fill #1

## 2017-05-24 MED FILL — CHLORTHALIDONE 50 MG TABS: 50 | 90 days supply | Qty: 180 | Fill #1

## 2017-08-07 ENCOUNTER — Other Ambulatory Visit: Payer: Self-pay | Admitting: Family Medicine

## 2017-08-07 DIAGNOSIS — I10 Essential (primary) hypertension: Secondary | ICD-10-CM

## 2017-08-08 MED FILL — AMLODIPINE BESYLATE 5 MG TA: 5 | 30 days supply | Qty: 30 | Fill #0

## 2017-09-01 ENCOUNTER — Ambulatory Visit: Payer: BC Managed Care – PPO | Admitting: Family Medicine

## 2017-09-01 ENCOUNTER — Other Ambulatory Visit: Payer: Self-pay

## 2017-09-01 ENCOUNTER — Encounter: Payer: Self-pay | Admitting: Family Medicine

## 2017-09-01 VITALS — BP 106/76 | HR 67 | Temp 98.3°F | Ht 65.0 in | Wt 197.8 lb

## 2017-09-01 DIAGNOSIS — Z13220 Encounter for screening for lipoid disorders: Secondary | ICD-10-CM | POA: Diagnosis not present

## 2017-09-01 DIAGNOSIS — K5900 Constipation, unspecified: Secondary | ICD-10-CM | POA: Diagnosis not present

## 2017-09-01 DIAGNOSIS — I10 Essential (primary) hypertension: Secondary | ICD-10-CM

## 2017-09-01 DIAGNOSIS — E876 Hypokalemia: Secondary | ICD-10-CM | POA: Diagnosis not present

## 2017-09-01 DIAGNOSIS — M25562 Pain in left knee: Secondary | ICD-10-CM

## 2017-09-01 LAB — LIPID PANEL
CHOL/HDL RATIO: 6.6 ratio — AB (ref 0.0–4.4)
Cholesterol, Total: 302 mg/dL — ABNORMAL HIGH (ref 100–199)
HDL: 46 mg/dL (ref >39)
LDL CALC: 215 mg/dL — AB (ref 0–99)
Triglycerides: 205 mg/dL — ABNORMAL HIGH (ref 0–149)
VLDL CHOLESTEROL CAL: 41 mg/dL — AB (ref 5–40)

## 2017-09-01 LAB — COMPREHENSIVE METABOLIC PANEL
ALBUMIN: 4.2 g/dL (ref 3.6–4.8)
ALK PHOS: 75 IU/L (ref 39–117)
ALT: 15 IU/L (ref 0–32)
AST: 20 IU/L (ref 0–40)
Albumin/Globulin Ratio: 2 (ref 1.2–2.2)
BUN / CREAT RATIO: 15 (ref 12–28)
BUN: 14 mg/dL (ref 8–27)
Bilirubin Total: 0.5 mg/dL (ref 0.0–1.2)
CO2: 22 mmol/L (ref 20–29)
CREATININE: 0.93 mg/dL (ref 0.57–1.00)
Calcium: 9.4 mg/dL (ref 8.7–10.3)
Chloride: 102 mmol/L (ref 96–106)
GFR calc non Af Amer: 67 mL/min/{1.73_m2} (ref >59)
GFR, EST AFRICAN AMERICAN: 77 mL/min/{1.73_m2} (ref >59)
GLOBULIN, TOTAL: 2.1 g/dL (ref 1.5–4.5)
GLUCOSE: 109 mg/dL — AB (ref 65–99)
Potassium: 3.3 mmol/L — ABNORMAL LOW (ref 3.5–5.2)
SODIUM: 141 mmol/L (ref 134–144)
TOTAL PROTEIN: 6.3 g/dL (ref 6.0–8.5)

## 2017-09-01 MED ORDER — AMLODIPINE BESYLATE 5 MG PO TABS: 5.0000 mg | ORAL_TABLET | Freq: Every day | ORAL | 1 refills | Status: DC

## 2017-09-01 MED ORDER — CHLORTHALIDONE 50 MG PO TABS: 100.0000 mg | ORAL_TABLET | Freq: Every day | ORAL | 1 refills | Status: DC

## 2017-09-01 NOTE — Progress Notes (Signed)
Subjective:  By signing my name below, I, Peggy Church, attest that this documentation has been prepared under the direction and in the presence of Peggy Agreste, MD Electronically Signed: Ladene Artist, ED Scribe 09/01/2017 at 8:20 AM.   Patient ID: Peggy Church, female    DOB: August 30, 1955, 62 y.o.   MRN: [id number]  Chief Complaint  Patient presents with  . Knee Pain    left knee and bp Check    HPI Peggy Church is a 62 y.o. female who presents to Primary Care at [facility] complaining of gradually improving L knee pain 1 wk ago. Pt states that she twisted her leg while placing it on a chair, slightly hyperextending it. Pt noticed minimal swelling and felt like her leg was going to give away while walking but states it has been improving over the past few days. She has tried elevating her leg and applying heat with some relief. No surgeries to the knee.  HTN Norvasc 5 mg and chlorthalidone 50. Last labs 01/2017. Potassium was low at 3.1. She was started on potassium at that time 10 mEq, initially 2 than 1 per day and advised to f/u for repeat potassium. She did not have repeat testing done. - Denies cp, sob, lightheadedness, dizziness, any new side-effects.  Constipation Pt does report intermittent constipation. Denies abdominal pain. She is a vegetarian and takes probiotics.  Patient Active Problem List   Diagnosis Date Noted  . BMI 31.0-31.9,adult 11/05/2015  . HTN (hypertension) 06/26/2012   Past Medical History:  Diagnosis Date  . Allergy   . Hypertension   . Migraine    Past Surgical History:  Procedure Laterality Date  . [place]   stretched  . ECTOPIC PREGNANCY SURGERY    . TONSILLECTOMY AND ADENOIDECTOMY    . TUBAL LIGATION     Allergies  Allergen Reactions  . Benicar [Olmesartan]     Smells and senses things that aren't really there.  . Lisinopril     cough  . Sulfa Drugs Cross Reactors    Prior to Admission medications   Medication Sig  Start Date End Date Taking? Authorizing Provider  amLODipine (NORVASC) 5 MG tablet TAKE 1 TABLET (5 MG TOTAL) BY MOUTH DAILY. 08/08/17   Peggy Agreste, MD  chlorthalidone (HYGROTON) 50 MG tablet TAKE 2 TABLETS (100 MG TOTAL) BY MOUTH DAILY. 08/08/17   Peggy Agreste, MD  cyclobenzaprine (FLEXERIL) 5 MG tablet 1 pill by mouth up to every 8 hours as needed. Start with one pill by mouth each bedtime as needed due to sedation 01/16/17   Peggy Agreste, MD  Multiple Vitamin (MULTI-VITAMINS) TABS Take by mouth daily.    [provider]  potassium chloride (K-DUR) 10 MEQ tablet Take 1 tablet (10 mEq total) by mouth daily. 2 tablets po x1 today, then 1 po qd. 01/19/17   Peggy Agreste, MD   Social History   Socioeconomic History  . Marital status: Divorced    Spouse name: n/a  . Number of children: 3  . Years of education: college  . Highest education level: Not on file  Occupational History  . Occupation: Consulting civil engineer    Comment: [employer] [place]  . Financial resource strain: Not on file  . Food insecurity:    Worry: Not on file    Inability: Not on file  . Transportation needs:    Medical: Not on file    Non-medical: Not on file  Tobacco  Use  . Smoking status: Former Smoker    Last attempt to quit: 07/18/1991    Years since quitting: 26.1  . Smokeless tobacco: Never Used  Substance and Sexual Activity  . Alcohol use: Yes    Alcohol/week: 0.0 oz    Comment: rarely  . Drug use: No  . Sexual activity: Not on file  Lifestyle  . Physical activity:    Days per week: Not on file    Minutes per session: Not on file  . Stress: Not on file  Relationships  . Social connections:    Talks on phone: Not on file    Gets together: Not on file    Attends religious service: Not on file    Active member of club or organization: Not on file    Attends meetings of clubs or organizations: Not on file    Relationship status: Not on file  . Intimate partner  violence:    Fear of current or ex partner: Not on file    Emotionally abused: Not on file    Physically abused: Not on file    Forced sexual activity: Not on file  Other Topics Concern  . Not on file  Social History Narrative   Lives with her daughter and fiance.   Review of Systems  Constitutional: Negative for fatigue and unexpected weight change.  Respiratory: Negative for chest tightness and shortness of breath.   Cardiovascular: Negative for chest pain, palpitations and leg swelling.  Gastrointestinal: Positive for constipation. Negative for abdominal pain and blood in stool.  Musculoskeletal: Positive for arthralgias and joint swelling.  Neurological: Negative for dizziness, syncope, light-headedness and headaches.      Objective:   Physical Exam  Constitutional: She is oriented to person, place, and time. She appears well-developed and well-nourished.  HENT:  Head: Normocephalic and atraumatic.  Eyes: Pupils are equal, round, and reactive to light. Conjunctivae and EOM are normal.  Neck: Carotid bruit is not present.  Cardiovascular: Normal rate, regular rhythm, normal heart sounds and intact distal pulses.  Pulmonary/Chest: Effort normal and breath sounds normal.  Abdominal: Soft. She exhibits no distension and no pulsatile midline mass. There is no tenderness.  Musculoskeletal:  L knee: possible trace effusion but difficult with habitus. Minimal tenderness in medial joint line. Skin intact, no ecchymosis. Full extension, slight discomfort in extension. Minimal discomfort with [place]. Neg varus/valgus.  Neurological: She is alert and oriented to person, place, and time.  Skin: Skin is warm and dry.  Psychiatric: She has a normal mood and affect. Her behavior is normal.  Vitals reviewed.  Vitals:   09/01/17 0814  BP: 106/76  Pulse: 67  Temp: 98.3 F (36.8 C)  TempSrc: Oral  SpO2: 96%  Weight: 197 lb 12.8 oz (89.7 kg)  Height: 5\' 5"  (1.651 m)      Assessment &  Plan:   Gwendolyne Welford is a 62 y.o. female Essential hypertension - Plan: Comprehensive metabolic panel  -Stable, check CMP, lipids.  Previously hypokalemic, if low again will restart supplement with follow-up testing within 1 to 2 weeks.  Tolerating medications, no changes for now.  Hypokalemia - Plan: Comprehensive metabolic panel  - as above  Acute pain of left knee  -Meniscus issue possible versus flare of arthritis based on symptoms, but has had significant improvement past few days.  Deferred x-ray/further work-up for now, but if pain returns, or instability symptoms return, return for recheck and imaging.  Constipation, unspecified constipation type  -Home treatment discussed,  handout given, RTC precautions  Screening for hyperlipidemia - Plan: Lipid panel   No orders of the defined types were placed in this encounter.  Patient Instructions    If knee pain is not continuing to improve, or return of feeling of giving way, return to discuss other treatments and likely xray.   Potassium was low previously, I will check that again today and if needed we will send another supplement prescription to your pharmacy.  If it is low, that will need to be rechecked within a week or 2 of starting that supplement.  See information below on constipation.  Colace over-the-counter as needed as a stool softener, MiraLAX if you are unable to have a bowel movement.  Return if the symptoms worsen.  No change in blood pressure medications for now.  I will check kidney, liver, cholesterol test today.  Thank you for coming in today.    Constipation, Adult Constipation is when a person has fewer bowel movements in a week than normal, has difficulty having a bowel movement, or has stools that are dry, hard, or larger than normal. Constipation may be caused by an underlying condition. It may become worse with age if a person takes certain medicines and does not take in enough fluids. Follow these  instructions at home: Eating and drinking   Eat foods that have a lot of fiber, such as fresh fruits and vegetables, whole grains, and beans.  Limit foods that are high in fat, low in fiber, or overly processed, such as french fries, hamburgers, cookies, candies, and soda.  Drink enough fluid to keep your urine clear or pale yellow. General instructions  Exercise regularly or as told by your health care provider.  Go to the restroom when you have the urge to go. Do not hold it in.  Take over-the-counter and prescription medicines only as told by your health care provider. These include any fiber supplements.  Practice pelvic floor retraining exercises, such as deep breathing while relaxing the lower abdomen and pelvic floor relaxation during bowel movements.  Watch your condition for any changes.  Keep all follow-up visits as told by your health care provider. This is important. Contact a health care provider if:  You have pain that gets worse.  You have a fever.  You do not have a bowel movement after 4 days.  You vomit.  You are not hungry.  You lose weight.  You are bleeding from the anus.  You have thin, pencil-like stools. Get help right away if:  You have a fever and your symptoms suddenly get worse.  You leak stool or have blood in your stool.  Your abdomen is bloated.  You have severe pain in your abdomen.  You feel dizzy or you faint. This information is not intended to replace advice given to you by your health care provider. Make sure you discuss any questions you have with your health care provider. Document Released: 01/22/2004 Document Revised: 11/13/2015 Document Reviewed: 10/14/2015 Elsevier Interactive Patient Education  2018 [place].   Knee Pain, Adult Knee pain in adults is common. It can be caused by many things, including:  Arthritis.  A fluid-filled sac (cyst) or growth in your knee.  An infection in your knee.  An injury  that will not heal.  Damage, swelling, or irritation of the tissues that support your knee.  Knee pain is usually not a sign of a serious problem. The pain may go away on its own with time and  rest. If it does not, a health care provider may order tests to find the cause of the pain. These may include:  Imaging tests, such as an X-ray, MRI, or ultrasound.  Joint aspiration. In this test, fluid is removed from the knee.  Arthroscopy. In this test, a lighted tube is inserted into knee and an image is projected onto a TV screen.  A biopsy. In this test, a sample of tissue is removed from the body and studied under a microscope.  Follow these instructions at home: Pay attention to any changes in your symptoms. Take these actions to relieve your pain. Activity  Rest your knee.  Do not do things that cause pain or make pain worse.  Avoid high-impact activities or exercises, such as running, jumping rope, or doing jumping jacks. General instructions  Take over-the-counter and prescription medicines only as told by your health care provider.  Raise (elevate) your knee above the level of your heart when you are sitting or lying down.  Sleep with a pillow under your knee.  If directed, apply ice to the knee: ? Put ice in a plastic bag. ? Place a towel between your skin and the bag. ? Leave the ice on for 20 minutes, 2-3 times a day.  Ask your health care provider if you should wear an elastic knee support.  Lose weight if you are overweight. Extra weight can put pressure on your knee.  Do not use any products that contain nicotine or tobacco, such as cigarettes and e-cigarettes. Smoking may slow the healing of any bone and joint problems that you may have. If you need help quitting, ask your health care provider. Contact a health care provider if:  Your knee pain continues, changes, or gets worse.  You have a fever along with knee pain.  Your knee buckles or locks up.  Your  knee swells, and the swelling becomes worse. Get help right away if:  Your knee feels warm to the touch.  You cannot move your knee.  You have severe pain in your knee.  You have chest pain.  You have trouble breathing. Summary  Knee pain in adults is common. It can be caused by many things, including, arthritis, infection, cysts, or injury.  Knee pain is usually not a sign of a serious problem, but if it does not go away, a health care provider may perform tests to know the cause of the pain.  Pay attention to any changes in your symptoms. Relieve your pain with rest, medicines, light activity, and use of ice.  Get help if your pain continues or becomes very severe, or if your knee buckles or locks up, or if you have chest pain or trouble breathing. This information is not intended to replace advice given to you by your health care provider. Make sure you discuss any questions you have with your health care provider. Document Released: 02/20/2007 Document Revised: 04/15/2016 Document Reviewed: 04/15/2016 Elsevier Interactive Patient Education  2018 [employer].    IF you received an x-ray today, you will receive an invoice from [facility]. Please contact [facility] at [phone] with questions or concerns regarding your invoice.   IF you received labwork today, you will receive an invoice from [place]. Please contact [facility] at [phone] with questions or concerns regarding your invoice.   Our billing staff will not be able to assist you with questions regarding bills from these companies.  You will be contacted with the lab results as soon  as they are available. The fastest way to get your results is to activate your My Chart account. Instructions are located on the last page of this paperwork. If you have not heard from [place] regarding the results in 2 weeks, please contact this office.       I personally performed the services described in this  documentation, which was scribed in my presence. The recorded information has been reviewed and considered for accuracy and completeness, addended by me as needed, and agree with information above.  Signed,   Merri Ray, MD Primary [facility] at [place].  09/01/17 8:32 AM

## 2017-09-01 NOTE — Patient Instructions (Addendum)
If knee pain is not continuing to improve, or return of feeling of giving way, return to discuss other treatments and likely xray.   Potassium was low previously, I will check that again today and if needed we will send another supplement prescription to your pharmacy.  If it is low, that will need to be rechecked within a week or 2 of starting that supplement.  See information below on constipation.  Colace over-the-counter as needed as a stool softener, MiraLAX if you are unable to have a bowel movement.  Return if the symptoms worsen.  No change in blood pressure medications for now.  I will check kidney, liver, cholesterol test today.  Thank you for coming in today.    Constipation, Adult Constipation is when a person has fewer bowel movements in a week than normal, has difficulty having a bowel movement, or has stools that are dry, hard, or larger than normal. Constipation may be caused by an underlying condition. It may become worse with age if a person takes certain medicines and does not take in enough fluids. Follow these instructions at home: Eating and drinking   Eat foods that have a lot of fiber, such as fresh fruits and vegetables, whole grains, and beans.  Limit foods that are high in fat, low in fiber, or overly processed, such as french fries, hamburgers, cookies, candies, and soda.  Drink enough fluid to keep your urine clear or pale yellow. General instructions  Exercise regularly or as told by your health care provider.  Go to the restroom when you have the urge to go. Do not hold it in.  Take over-the-counter and prescription medicines only as told by your health care provider. These include any fiber supplements.  Practice pelvic floor retraining exercises, such as deep breathing while relaxing the lower abdomen and pelvic floor relaxation during bowel movements.  Watch your condition for any changes.  Keep all follow-up visits as told by your health care  provider. This is important. Contact a health care provider if:  You have pain that gets worse.  You have a fever.  You do not have a bowel movement after 4 days.  You vomit.  You are not hungry.  You lose weight.  You are bleeding from the anus.  You have thin, pencil-like stools. Get help right away if:  You have a fever and your symptoms suddenly get worse.  You leak stool or have blood in your stool.  Your abdomen is bloated.  You have severe pain in your abdomen.  You feel dizzy or you faint. This information is not intended to replace advice given to you by your health care provider. Make sure you discuss any questions you have with your health care provider. Document Released: 01/22/2004 Document Revised: 11/13/2015 Document Reviewed: 10/14/2015 Elsevier Interactive Patient Education  2018 [place].   Knee Pain, Adult Knee pain in adults is common. It can be caused by many things, including:  Arthritis.  A fluid-filled sac (cyst) or growth in your knee.  An infection in your knee.  An injury that will not heal.  Damage, swelling, or irritation of the tissues that support your knee.  Knee pain is usually not a sign of a serious problem. The pain may go away on its own with time and rest. If it does not, a health care provider may order tests to find the cause of the pain. These may include:  Imaging tests, such as an X-ray, MRI, or ultrasound.  Joint aspiration. In this test, fluid is removed from the knee.  Arthroscopy. In this test, a lighted tube is inserted into knee and an image is projected onto a TV screen.  A biopsy. In this test, a sample of tissue is removed from the body and studied under a microscope.  Follow these instructions at home: Pay attention to any changes in your symptoms. Take these actions to relieve your pain. Activity  Rest your knee.  Do not do things that cause pain or make pain worse.  Avoid high-impact  activities or exercises, such as running, jumping rope, or doing jumping jacks. General instructions  Take over-the-counter and prescription medicines only as told by your health care provider.  Raise (elevate) your knee above the level of your heart when you are sitting or lying down.  Sleep with a pillow under your knee.  If directed, apply ice to the knee: ? Put ice in a plastic bag. ? Place a towel between your skin and the bag. ? Leave the ice on for 20 minutes, 2-3 times a day.  Ask your health care provider if you should wear an elastic knee support.  Lose weight if you are overweight. Extra weight can put pressure on your knee.  Do not use any products that contain nicotine or tobacco, such as cigarettes and e-cigarettes. Smoking may slow the healing of any bone and joint problems that you may have. If you need help quitting, ask your health care provider. Contact a health care provider if:  Your knee pain continues, changes, or gets worse.  You have a fever along with knee pain.  Your knee buckles or locks up.  Your knee swells, and the swelling becomes worse. Get help right away if:  Your knee feels warm to the touch.  You cannot move your knee.  You have severe pain in your knee.  You have chest pain.  You have trouble breathing. Summary  Knee pain in adults is common. It can be caused by many things, including, arthritis, infection, cysts, or injury.  Knee pain is usually not a sign of a serious problem, but if it does not go away, a health care provider may perform tests to know the cause of the pain.  Pay attention to any changes in your symptoms. Relieve your pain with rest, medicines, light activity, and use of ice.  Get help if your pain continues or becomes very severe, or if your knee buckles or locks up, or if you have chest pain or trouble breathing. This information is not intended to replace advice given to you by your health care provider. Make  sure you discuss any questions you have with your health care provider. Document Released: 02/20/2007 Document Revised: 04/15/2016 Document Reviewed: 04/15/2016 Elsevier Interactive Patient Education  2018 [employer].    IF you received an x-ray today, you will receive an invoice from [facility]. Please contact [facility] at [phone] with questions or concerns regarding your invoice.   IF you received labwork today, you will receive an invoice from [place]. Please contact [facility] at [phone] with questions or concerns regarding your invoice.   Our billing staff will not be able to assist you with questions regarding bills from these companies.  You will be contacted with the lab results as soon as they are available. The fastest way to get your results is to activate your My Chart account. Instructions are located on the last page of this paperwork. If you have not  heard from [place] regarding the results in 2 weeks, please contact this office.

## 2017-09-11 MED FILL — AMLODIPINE BESYLATE 5 MG TA: 5 | 90 days supply | Qty: 90 | Fill #0

## 2017-09-11 MED FILL — CHLORTHALIDONE 50 MG TAB: 50 | 90 days supply | Qty: 180 | Fill #0

## 2017-09-16 ENCOUNTER — Other Ambulatory Visit: Payer: Self-pay | Admitting: Family Medicine

## 2017-09-16 DIAGNOSIS — E876 Hypokalemia: Secondary | ICD-10-CM

## 2017-09-16 MED ORDER — POTASSIUM CHLORIDE ER 10 MEQ PO TBCR: 10.0000 meq | EXTENDED_RELEASE_TABLET | Freq: Every day | ORAL | 2 refills | Status: DC

## 2017-09-16 NOTE — Progress Notes (Signed)
See lab notes.restart potassium supplement.

## 2017-09-19 ENCOUNTER — Telehealth: Payer: Self-pay | Admitting: Family Medicine

## 2017-09-19 ENCOUNTER — Other Ambulatory Visit: Payer: Self-pay

## 2017-09-19 DIAGNOSIS — E876 Hypokalemia: Secondary | ICD-10-CM

## 2017-09-19 MED ORDER — POTASSIUM CHLORIDE ER 10 MEQ PO TBCR: 10.0000 meq | EXTENDED_RELEASE_TABLET | Freq: Every day | ORAL | 2 refills | Status: DC

## 2017-09-19 MED FILL — POTASSIUM CL ER 10 MEQ TABL: 10 | 30 days supply | Qty: 30 | Fill #0

## 2017-09-19 NOTE — Telephone Encounter (Signed)
Copied from [place] [id number]. Topic: Quick Communication - See Telephone Encounter >> Sep 19, 2017  9:47 AM Robina Ade, Helene Kelp D wrote: CRM for notification. See Telephone encounter for: 09/19/17. Patient called and said that she needs her potassium medication to be sent to [facility] [place], [place].

## 2017-09-19 NOTE — Telephone Encounter (Signed)
Medication sent.

## 2017-10-05 ENCOUNTER — Ambulatory Visit: Payer: Self-pay

## 2017-10-05 NOTE — Telephone Encounter (Signed)
Patient called in with c/o "tooth pain." She says "I have been having tooth pain since I had a deep clean of my teeth about 2 weeks ago. I called my dentist, but they can't see me until next week. Today while at work, my co-workers brought my attention to my face is swollen. The pain is a 7-8, it wakes me up at night sometimes." I asked about fever, she denies. She says "I would like to be seen tomorrow, if possible, so I can start on some antibiotics." According to protocol, see PCP within 4 hours, edited for within 24 hours at patient request, appointment scheduled for tomorrow at 1000 with Dr. Nyoka Cowden, care advice given, patient verbalized understanding.   Reason for Disposition . Face is very swollen  Answer Assessment - Initial Assessment Questions 1. LOCATION: "Which tooth is hurting?"  (e.g., right-side/left-side, upper/lower, front/back)     Top left side 2. ONSET: "When did the toothache start?"  (e.g., hours, days)      Ongoing for a couple of weeks ago 3. SEVERITY: "How bad is the toothache?"  (Scale 1-10; mild, moderate or severe)   - MILD (1-3): doesn't interfere with chewing    - MODERATE (4-7): interferes with chewing, interferes with normal activities, awakens from sleep     - SEVERE (8-10): unable to eat, unable to do any normal activities, excruciating pain        7-8 4. SWELLING: "Is there any visible swelling of your face?"     Yes 5. OTHER SYMPTOMS: "Do you have any other symptoms?" (e.g., fever)     No 6. PREGNANCY: "Is there any chance you are pregnant?" "When was your last menstrual period?"     No  Protocols used: TOOTHACHE-A-AH

## 2017-10-06 ENCOUNTER — Encounter: Payer: Self-pay | Admitting: Family Medicine

## 2017-10-06 ENCOUNTER — Other Ambulatory Visit: Payer: Self-pay

## 2017-10-06 ENCOUNTER — Encounter (HOSPITAL_COMMUNITY): Payer: Self-pay | Admitting: *Deleted

## 2017-10-06 ENCOUNTER — Emergency Department (HOSPITAL_COMMUNITY): Payer: BC Managed Care – PPO

## 2017-10-06 ENCOUNTER — Ambulatory Visit: Payer: BC Managed Care – PPO | Admitting: Family Medicine

## 2017-10-06 ENCOUNTER — Emergency Department (HOSPITAL_COMMUNITY)
Admission: EM | Admit: 2017-10-06 | Discharge: 2017-10-06 | Disposition: A | Discharge status: Home / Self Care | Payer: BC Managed Care – PPO | Attending: Emergency Medicine | Admitting: Emergency Medicine

## 2017-10-06 VITALS — BP 102/67 | HR 67 | Temp 98.5°F | Resp 17 | Ht 66.5 in | Wt 195.0 lb

## 2017-10-06 DIAGNOSIS — R29810 Facial weakness: Secondary | ICD-10-CM | POA: Insufficient documentation

## 2017-10-06 DIAGNOSIS — R202 Paresthesia of skin: Secondary | ICD-10-CM | POA: Diagnosis not present

## 2017-10-06 DIAGNOSIS — K047 Periapical abscess without sinus: Secondary | ICD-10-CM

## 2017-10-06 DIAGNOSIS — R200 Anesthesia of skin: Secondary | ICD-10-CM | POA: Diagnosis not present

## 2017-10-06 DIAGNOSIS — I10 Essential (primary) hypertension: Secondary | ICD-10-CM | POA: Diagnosis not present

## 2017-10-06 DIAGNOSIS — E876 Hypokalemia: Secondary | ICD-10-CM | POA: Insufficient documentation

## 2017-10-06 DIAGNOSIS — Z79899 Other long term (current) drug therapy: Secondary | ICD-10-CM | POA: Diagnosis not present

## 2017-10-06 DIAGNOSIS — Z87891 Personal history of nicotine dependence: Secondary | ICD-10-CM | POA: Insufficient documentation

## 2017-10-06 DIAGNOSIS — R51 Headache: Secondary | ICD-10-CM | POA: Diagnosis present

## 2017-10-06 DIAGNOSIS — H538 Other visual disturbances: Secondary | ICD-10-CM | POA: Diagnosis not present

## 2017-10-06 LAB — I-STAT TROPONIN, ED: Troponin i, poc: 0 ng/mL (ref 0.00–0.08)

## 2017-10-06 LAB — PROTIME-INR
INR: 1.05
PROTHROMBIN TIME: 13.6 s (ref 11.4–15.2)

## 2017-10-06 LAB — COMPREHENSIVE METABOLIC PANEL
ALBUMIN: 4.1 g/dL (ref 3.5–5.0)
ALK PHOS: 76 U/L (ref 38–126)
ALT: 28 U/L (ref 14–54)
ANION GAP: 12 (ref 5–15)
AST: 27 U/L (ref 15–41)
BUN: 12 mg/dL (ref 6–20)
CALCIUM: 9.4 mg/dL (ref 8.9–10.3)
CO2: 27 mmol/L (ref 22–32)
Chloride: 102 mmol/L (ref 101–111)
Creatinine, Ser: 0.95 mg/dL (ref 0.44–1.00)
GFR calc non Af Amer: >60 mL/min (ref >60)
GLUCOSE: 104 mg/dL — AB (ref 65–99)
POTASSIUM: 2.9 mmol/L — AB (ref 3.5–5.1)
Sodium: 141 mmol/L (ref 135–145)
Total Bilirubin: 0.8 mg/dL (ref 0.3–1.2)
Total Protein: 6.7 g/dL (ref 6.5–8.1)

## 2017-10-06 LAB — DIFFERENTIAL
Abs Immature Granulocytes: 0 10*3/uL (ref 0.0–0.1)
Basophils Absolute: 0.1 10*3/uL (ref 0.0–0.1)
Basophils Relative: 1 %
EOS ABS: 0.2 10*3/uL (ref 0.0–0.7)
EOS PCT: 3 %
Immature Granulocytes: 0 %
Lymphocytes Relative: 41 %
Lymphs Abs: 3 10*3/uL (ref 0.7–4.0)
MONO ABS: 0.6 10*3/uL (ref 0.1–1.0)
Monocytes Relative: 8 %
NEUTROS PCT: 47 %
Neutro Abs: 3.4 10*3/uL (ref 1.7–7.7)

## 2017-10-06 LAB — CBC
HCT: 43.5 % (ref 36.0–46.0)
Hemoglobin: 14.9 g/dL (ref 12.0–15.0)
MCH: 28.2 pg (ref 26.0–34.0)
MCHC: 34.3 g/dL (ref 30.0–36.0)
MCV: 82.2 fL (ref 78.0–100.0)
PLATELETS: 357 10*3/uL (ref 150–400)
RBC: 5.29 MIL/uL — ABNORMAL HIGH (ref 3.87–5.11)
RDW: 13.5 % (ref 11.5–15.5)
WBC: 7.2 10*3/uL (ref 4.0–10.5)

## 2017-10-06 LAB — APTT: APTT: 34 s (ref 24–36)

## 2017-10-06 MED ORDER — CLINDAMYCIN HCL 300 MG PO CAPS: 300.0000 mg | ORAL_CAPSULE | Freq: Three times a day (TID) | ORAL | 0 refills | Status: DC

## 2017-10-06 MED ORDER — POTASSIUM CHLORIDE CRYS ER 20 MEQ PO TBCR
40.0000 meq | EXTENDED_RELEASE_TABLET | Freq: Once | ORAL | Status: AC
  Administered 2017-10-06: 40 meq via ORAL
  Filled 2017-10-06: qty 2

## 2017-10-06 MED ORDER — CLINDAMYCIN HCL 150 MG PO CAPS
300.0000 mg | ORAL_CAPSULE | Freq: Once | ORAL | Status: AC
  Administered 2017-10-06: 300 mg via ORAL
  Filled 2017-10-06: qty 2

## 2017-10-06 NOTE — Patient Instructions (Addendum)
There does appear to be a possible abscess at the base of 1 your upper teeth on the left side.  However I am concerned about the numbness in your face, decreased use of your mouth and now new vision symptoms of other possible neurologic causes.  Please proceed to the emergency room after leaving our office for further evaluation and possible imaging.   IF you received an x-ray today, you will receive an invoice from [facility]. Please contact [facility] at [phone] with questions or concerns regarding your invoice.   IF you received labwork today, you will receive an invoice from [place]. Please contact [facility] at [phone] with questions or concerns regarding your invoice.   Our billing staff will not be able to assist you with questions regarding bills from these companies.  You will be contacted with the lab results as soon as they are available. The fastest way to get your results is to activate your My Chart account. Instructions are located on the last page of this paperwork. If you have not heard from [place] regarding the results in 2 weeks, please contact this office.

## 2017-10-06 NOTE — Progress Notes (Addendum)
Subjective:  By signing my name below, I, [name], attest that this documentation has been prepared under the direction and in the presence of [name], MD Electronically Signed: Ladene Artist, ED Scribe 10/06/2017 at 10:47 AM.   Patient ID: Peggy Church, female    DOB: [birth year], 62 y.o.   MRN: [id number]  Chief Complaint  Patient presents with  . Dental Injury    facial swelling    HPI Peggy Church is a 62 y.o. female who presents to Primary Care at [facility] gradually worsening L upper dental pain and L sided facial swelling x 4 days. Pt states that she woke up with pain 4 days ago after grinding her teeth the prev night. She does report having a deep cleaning done by her dentist a few wks ago and using a dental floss pick 2 days ago which she suspects she suspects may have irritated her gum. Pt states she initially just had dental pain but noticed difficulty smiling and moving the L side of her face yesterday, slurred speech last night, mild HA, numbness to the L side of her face, decreased vision in the L eye this morning. Pt has tried 1-2 Aspirin every 3-4 hrs, none today. Denies fever, weakness in extremities.  Patient Active Problem List   Diagnosis Date Noted  . BMI 31.0-31.9,adult 11/05/2015  . HTN (hypertension) 06/26/2012   Past Medical History:  Diagnosis Date  . Allergy   . Hypertension   . Migraine    Past Surgical History:  Procedure Laterality Date  . [place]   stretched  . ECTOPIC PREGNANCY SURGERY    . TONSILLECTOMY AND ADENOIDECTOMY    . TUBAL LIGATION     Allergies  Allergen Reactions  . Benicar [Olmesartan]     Smells and senses things that aren't really there.  . Lisinopril     cough  . Sulfa Drugs Cross Reactors    Prior to Admission medications   Medication Sig Start Date End Date Taking? Authorizing Provider  amLODipine (NORVASC) 5 MG tablet Take 1 tablet (5 mg total) by mouth daily. 09/01/17   [name], MD    chlorthalidone (HYGROTON) 50 MG tablet Take 2 tablets (100 mg total) by mouth daily. 09/01/17   [name], MD  Multiple Vitamin (MULTI-VITAMINS) TABS Take by mouth daily.    [provider]  potassium chloride (K-DUR) 10 MEQ tablet Take 1 tablet (10 mEq total) by mouth daily. 09/19/17   [name], MD   Social History   Socioeconomic History  . Marital status: Divorced    Spouse name: n/a  . Number of children: 3  . Years of education: college  . Highest education level: Not on file  Occupational History  . Occupation: Consulting civil engineer    Comment: [place]  . Financial resource strain: Not on file  . Food insecurity:    Worry: Not on file    Inability: Not on file  . Transportation needs:    Medical: Not on file    Non-medical: Not on file  Tobacco Use  . Smoking status: Former Smoker    Last attempt to quit: 07/18/1991    Years since quitting: 26.2  . Smokeless tobacco: Never Used  Substance and Sexual Activity  . Alcohol use: Yes    Alcohol/week: 0.0 oz    Comment: rarely  . Drug use: No  . Sexual activity: Not on file  Lifestyle  . Physical  activity:    Days per week: Not on file    Minutes per session: Not on file  . Stress: Not on file  Relationships  . Social connections:    Talks on phone: Not on file    Gets together: Not on file    Attends religious service: Not on file    Active member of club or organization: Not on file    Attends meetings of clubs or organizations: Not on file    Relationship status: Not on file  . Intimate partner violence:    Fear of current or ex partner: Not on file    Emotionally abused: Not on file    Physically abused: Not on file    Forced sexual activity: Not on file  Other Topics Concern  . Not on file  Social History Narrative   Lives with her daughter and fiance.   Review of Systems  Constitutional: Negative for fever.  HENT: Positive for dental problem and facial  swelling.   Eyes: Positive for visual disturbance.  Neurological: Positive for speech difficulty, numbness and headaches (mild). Negative for weakness.      Objective:   Physical Exam  Constitutional: She is oriented to person, place, and time. She appears well-developed and well-nourished. No distress.  HENT:  Head: Normocephalic and atraumatic.  Hypertrophy of the palette bilaterally and in the lingular side of gums possible abscess at base of 1st molar on upper L. No active discharge. Focal soft tissue swelling and erythema at base of bony prominence. Slightly decreased smile on the L. Eye closure appears equal. Able to puff out cheeks without air lost. Complains of numbness over maxillary face. Tenderness along L TMJ. Mastoids nontender.  Eyes: Conjunctivae and EOM are normal.  Neck: Neck supple. Carotid bruit is not present. No tracheal deviation present.  Cardiovascular: Normal rate, regular rhythm, S1 normal and normal heart sounds. Exam reveals no gallop and no friction rub.  No murmur heard. Pulmonary/Chest: Effort normal. No respiratory distress.  Musculoskeletal: Normal range of motion.  Neurological: She is alert and oriented to person, place, and time.  No pronator drift. Normal finger to nose. Normal heel to toe. No focal weakness in extremities.  Skin: Skin is warm and dry.  Psychiatric: She has a normal mood and affect. Her behavior is normal.  Nursing note and vitals reviewed.   Visual Acuity Screening   Right eye Left eye Both eyes  Without correction:     With correction: 20/25 20/25 20/25     Vitals:   10/06/17 1021  BP: 102/67  Pulse: 67  Resp: 17  Temp: 98.5 F (36.9 C)  TempSrc: Oral  SpO2: 98%  Weight: 195 lb (88.5 kg)  Height: 5' 6.5" (1.689 m)      Assessment & Plan:  Peggy Church is a 62 y.o. female Dental abscess  Numbness and tingling of left side of face  Facial weakness  Blurred vision, left eye  Does appear to have an abscess above  her left first molar without active discharge.  However with progressive face pain, numbness, decreased smile on left and now with left-sided headache and blurry vision, further evaluation requested through emergency room.  Differential includes deep space infection versus early Bell's palsy.  No other apparent focal weakness on neurologic exam except for slight decreased smile on the left. vision testing does appear equal.  Triage nurse advised that [facility] ER.  Patient will proceed by private vehicle.  No orders of the defined types  were placed in this encounter.  Patient Instructions   There does appear to be a possible abscess at the base of 1 your upper teeth on the left side.  However I am concerned about the numbness in your face, decreased use of your mouth and now new vision symptoms of other possible neurologic causes.  Please proceed to the emergency room after leaving our office for further evaluation and possible imaging.   IF you received an x-ray today, you will receive an invoice from [facility]. Please contact [facility]iology at [phone] with questions or concerns regarding your invoice.   IF you received labwork today, you will receive an invoice from [place]. Please contact [facility] at [phone] with questions or concerns regarding your invoice.   Our billing staff will not be able to assist you with questions regarding bills from these companies.  You will be contacted with the lab results as soon as they are available. The fastest way to get your results is to activate your My Chart account. Instructions are located on the last page of this paperwork. If you have not heard from [place] regarding the results in 2 weeks, please contact this office.       I personally performed the services described in this documentation, which was scribed in my presence. The recorded information has been reviewed and considered for accuracy and completeness, addended by  me as needed, and agree with information above.  Signed,   Merri Ray, MD Primary Care at [place].  10/06/17 11:06 AM

## 2017-10-06 NOTE — ED Provider Notes (Signed)
[facility] EMERGENCY DEPARTMENT Provider Note   CSN: [id number] Arrival date & time: 10/06/17  1149     History   Chief Complaint Chief Complaint  Patient presents with  . facial droop/slurred speech    HPI Peggy Church is a 62 y.o. female.  Patient c/o left facial pain and swelling for past several days. States recently had a deep dental cleaning. When pt felt pain/swelling, she tried to get in with her dentist, but no available appts this weeks. No recent abx use. Pain/swelling to area left upper molar/gum area is constant, dull, moderate, worse w palpation area and chewing. No throat pain or swelling.  No trouble breathing or swallowing. No chills/sweats.   The history is provided by the patient.    Past Medical History:  Diagnosis Date  . Allergy   . Hypertension   . Migraine     Patient Active Problem List   Diagnosis Date Noted  . BMI 31.0-31.9,adult 11/05/2015  . HTN (hypertension) 06/26/2012    Past Surgical History:  Procedure Laterality Date  . [place]   stretched  . ECTOPIC PREGNANCY SURGERY    . TONSILLECTOMY AND ADENOIDECTOMY    . TUBAL LIGATION       OB History    Gravida  5   Para  3   Term      Preterm      AB      Living        SAB      TAB      Ectopic      Multiple      Live Births               Home Medications    Prior to Admission medications   Medication Sig Start Date End Date Taking? Authorizing Provider  amLODipine (NORVASC) 5 MG tablet Take 1 tablet (5 mg total) by mouth daily. 09/01/17   [name], MD  chlorthalidone (HYGROTON) 50 MG tablet Take 2 tablets (100 mg total) by mouth daily. 09/01/17   [name], MD  Multiple Vitamin (MULTI-VITAMINS) TABS Take by mouth daily.    [provider]  potassium chloride (K-DUR) 10 MEQ tablet Take 1 tablet (10 mEq total) by mouth daily. 09/19/17   [name], MD    Family History Family History    Problem Relation Age of Onset  . Hyperlipidemia Mother   . Hypertension Mother   . Stroke Mother   . Heart disease Mother   . Cancer Father        prostate  . Hyperlipidemia Father   . Hypertension Father   . Cancer Brother     Social History Social History   Tobacco Use  . Smoking status: Former Smoker    Last attempt to quit: 07/18/1991    Years since quitting: 26.2  . Smokeless tobacco: Never Used  Substance Use Topics  . Alcohol use: Yes    Alcohol/week: 0.0 oz    Comment: rarely  . Drug use: No     Allergies   Benicar [olmesartan]; Lisinopril; and Sulfa drugs cross reactors   Review of Systems Review of Systems  Constitutional: Negative for chills.  HENT: Negative for sore throat and trouble swallowing.   Eyes: Negative for pain and visual disturbance.  Gastrointestinal: Negative for vomiting.  Musculoskeletal: Negative for neck pain and neck stiffness.  Skin: Negative for rash.  Neurological: Negative for weakness and numbness.  Physical Exam Updated Vital Signs BP (!) 115/57   Pulse 62   Temp 98.3 F (36.8 C) (Oral)   Resp 16   Ht 1.676 m (5\' 6" )   Wt 88.9 kg (196 lb)   SpO2 99%   BMI 31.64 kg/m   Physical Exam  Constitutional: She appears well-developed and well-nourished.  HENT:  Head: Atraumatic.  Mouth/Throat: Oropharynx is clear and moist.   Left upper dental/gum tenderness, swelling of gums above teeth 11-13. No fluctuance noted. Mild swelling, and tenderness to left lower cheek.   Eyes: Pupils are equal, round, and reactive to light. Conjunctivae are normal. No scleral icterus.  Neck: Neck supple. No tracheal deviation present.  Cardiovascular: Normal rate and regular rhythm.  Pulmonary/Chest: Effort normal and breath sounds normal. No respiratory distress.  Abdominal: Normal appearance. She exhibits no distension.  Musculoskeletal: She exhibits no edema.  Neurological: She is alert. No cranial nerve deficit.  Speech  clear/fluent. Moves bilateral extremities purposefully w good strength. Steady gait.   Skin: Skin is warm and dry. No rash noted. She is not diaphoretic.  Psychiatric: She has a normal mood and affect.  Nursing note and vitals reviewed.    ED Treatments / Results  Labs (all labs ordered are listed, but only abnormal results are displayed) Results for orders placed or performed during the hospital encounter of 10/06/17  Protime-INR  Result Value Ref Range   Prothrombin Time 13.6 11.4 - 15.2 seconds   INR 1.05   APTT  Result Value Ref Range   aPTT 34 24 - 36 seconds  CBC  Result Value Ref Range   WBC 7.2 4.0 - 10.5 K/uL   RBC 5.29 (H) 3.87 - 5.11 MIL/uL   Hemoglobin 14.9 12.0 - 15.0 g/dL   HCT 43.5 36.0 - 46.0 %   MCV 82.2 78.0 - 100.0 fL   MCH 28.2 26.0 - 34.0 pg   MCHC 34.3 30.0 - 36.0 g/dL   RDW 13.5 11.5 - 15.5 %   Platelets 357 150 - 400 K/uL  Differential  Result Value Ref Range   Neutrophils Relative % 47 %   Neutro Abs 3.4 1.7 - 7.7 K/uL   Lymphocytes Relative 41 %   Lymphs Abs 3.0 0.7 - 4.0 K/uL   Monocytes Relative 8 %   Monocytes Absolute 0.6 0.1 - 1.0 K/uL   Eosinophils Relative 3 %   Eosinophils Absolute 0.2 0.0 - 0.7 K/uL   Basophils Relative 1 %   Basophils Absolute 0.1 0.0 - 0.1 K/uL   Immature Granulocytes 0 %   Abs Immature Granulocytes 0.0 0.0 - 0.1 K/uL  Comprehensive metabolic panel  Result Value Ref Range   Sodium 141 135 - 145 mmol/L   Potassium 2.9 (L) 3.5 - 5.1 mmol/L   Chloride 102 101 - 111 mmol/L   CO2 27 22 - 32 mmol/L   Glucose, Bld 104 (H) 65 - 99 mg/dL   BUN 12 6 - 20 mg/dL   Creatinine, Ser 0.95 0.44 - 1.00 mg/dL   Calcium 9.4 8.9 - 10.3 mg/dL   Total Protein 6.7 6.5 - 8.1 g/dL   Albumin 4.1 3.5 - 5.0 g/dL   AST 27 15 - 41 U/L   ALT 28 14 - 54 U/L   Alkaline Phosphatase 76 38 - 126 U/L   Total Bilirubin 0.8 0.3 - 1.2 mg/dL   GFR calc non Af Amer >60 >60 mL/min   GFR calc Af Amer >60 >60 mL/min   Anion gap  12 5 - 15  I-stat  troponin, ED  Result Value Ref Range   Troponin i, poc 0.00 0.00 - 0.08 ng/mL   Comment 3           Ct Head Wo Contrast  Result Date: 10/06/2017 CLINICAL DATA:  Headache. Soft tissue swelling left periorbital region. EXAM: CT HEAD WITHOUT CONTRAST TECHNIQUE: Contiguous axial images were obtained from the base of the skull through the vertex without intravenous contrast. COMPARISON:  None. FINDINGS: Brain: The ventricles are normal in size and configuration. The frontal sulci and to a lesser extent parietal sulci are rather prominent bilaterally. There is no intracranial mass, hemorrhage, extra-axial fluid collection, or midline shift. Gray-white compartments appear normal. There is no evident acute infarct. Vascular: No hyperdense vessel. There is calcification in each carotid siphon region. Skull: Bony calvarium appears intact. Sinuses/Orbits: There is mucosal thickening in several ethmoid air cells. Other visualized paranasal sinuses are clear. Orbits appear symmetric bilaterally. Other: Mastoid air cells are clear. There is debris in the right external auditory canal. 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 Ventricles normal in size and configuration. No intracranial mass or hemorrhage. Gray-white compartments appear normal. There are foci of arterial vascular calcification. There is mucosal thickening in several ethmoid air cells. There is probable cerumen in the right external auditory canal. Electronically Signed   By: Lowella Grip III M.D.   On: 10/06/2017 13:14    EKG None  Radiology Ct Head Wo Contrast  Result Date: 10/06/2017 CLINICAL DATA:  Headache. Soft tissue swelling left periorbital region. EXAM: CT HEAD WITHOUT CONTRAST TECHNIQUE: Contiguous axial images were obtained from the base of the skull through the vertex without intravenous contrast. COMPARISON:  None. FINDINGS: Brain: The ventricles are normal in size and configuration. The frontal sulci and to a lesser extent  parietal sulci are rather prominent bilaterally. There is no intracranial mass, hemorrhage, extra-axial fluid collection, or midline shift. Gray-white compartments appear normal. There is no evident acute infarct. Vascular: No hyperdense vessel. There is calcification in each carotid siphon region. Skull: Bony calvarium appears intact. Sinuses/Orbits: There is mucosal thickening in several ethmoid air cells. Other visualized paranasal sinuses are clear. Orbits appear symmetric bilaterally. Other: Mastoid air cells are clear. There is debris in the right external auditory canal. 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 Ventricles normal in size and configuration. No intracranial mass or hemorrhage. Gray-white compartments appear normal. There are foci of arterial vascular calcification. There is mucosal thickening in several ethmoid air cells. There is probable cerumen in the right external auditory canal. Electronically Signed   By: Lowella Grip III M.D.   On: 10/06/2017 13:14    Procedures Procedures (including critical care time)  Medications Ordered in ED Medications  clindamycin (CLEOCIN) capsule 300 mg (has no administration in time range)     Initial Impression / Assessment and Plan / ED Course  I have reviewed the triage vital signs and the nursing notes.  Pertinent labs & imaging results that were available during my care of the patient were reviewed by me and considered in my medical decision making (see chart for details).  Labs/imaging from triage.  Reviewed nursing notes and prior charts for additional history.   Imaging reviewed - no acute ic process.   Labs reviewed - k low, wbc normal. kcl po.   Exam/symptoms felt c/w dental infection suspected early abscess.  Confirmed only abx allergy is sulfa. clinda po.   rec dental f/u Monday.  Return precautions provided.   Pt currently appears stable for d/c.     Final Clinical Impressions(s) / ED Diagnoses    Final diagnoses:  None    ED Discharge Orders    None       Lajean Saver, MD 10/06/17 [phone]

## 2017-10-06 NOTE — ED Triage Notes (Signed)
Patient directed to ED for further evaluation of possible dental infection following deep cleaning a week ago. Reports some dental pain to upper left jaw and face, unsure what she needs to do-states that the swelling making her have slurred words. Alert and oriented. No grip changes, no drift but mouth droop noted. Symptoms x 4 days

## 2017-10-06 NOTE — ED Provider Notes (Addendum)
Went to see/check pt - pt not in room ? Left vs still in waiting room. rn to check with triage.     Lajean Saver, MD 10/06/17 [place] [place]    Lajean Saver, MD 10/06/17 [phone]

## 2017-10-06 NOTE — Discharge Instructions (Addendum)
It was our pleasure to provide your ER care today - we hope that you feel better.  Take antibiotic as prescribed.   Follow up with your dentist Monday for recheck - call office/office number tomorrow to arrange follow up appointment.   Return to ER if worse, new symptoms, increased swelling, spreading redness, severe pain, high fevers, other concern.  From today's lab tests, your potassium level is low (2.9) - take one extra of your potassium supplement each day for the next 3 days, eat plenty of fruits and vegetables, and follow up with your doctor next week.

## 2017-10-21 ENCOUNTER — Other Ambulatory Visit: Payer: Self-pay

## 2017-10-21 ENCOUNTER — Ambulatory Visit: Payer: BC Managed Care – PPO | Admitting: Physician Assistant

## 2017-10-21 ENCOUNTER — Encounter: Payer: Self-pay | Admitting: Physician Assistant

## 2017-10-21 VITALS — BP 110/72 | HR 71 | Temp 98.6°F | Resp 18 | Ht 66.0 in | Wt 195.8 lb

## 2017-10-21 DIAGNOSIS — R202 Paresthesia of skin: Secondary | ICD-10-CM

## 2017-10-21 DIAGNOSIS — E876 Hypokalemia: Secondary | ICD-10-CM | POA: Diagnosis not present

## 2017-10-21 DIAGNOSIS — R200 Anesthesia of skin: Secondary | ICD-10-CM

## 2017-10-21 DIAGNOSIS — K047 Periapical abscess without sinus: Secondary | ICD-10-CM

## 2017-10-21 MED ORDER — CLINDAMYCIN HCL 300 MG PO CAPS: 300.0000 mg | ORAL_CAPSULE | Freq: Three times a day (TID) | ORAL | 0 refills | Status: AC

## 2017-10-21 NOTE — Patient Instructions (Addendum)
We are going to treat you with clindamycin until you are able to see the endodontist.  If you develop worsening tingling and nerve pain in your face, or speech problems, or movement problems with your face you need to go to the ED. If you need more than 5 days of clindamycin you will need the endodontist to continue the Rx.    IF you received an x-ray today, you will receive an invoice from [facility] Radiology. Please contact [facility] Radiology at [phone] with questions or concerns regarding your invoice.   IF you received labwork today, you will receive an invoice from [place]. Please contact [facility] at [phone] with questions or concerns regarding your invoice.   Our billing staff will not be able to assist you with questions regarding bills from these companies.  You will be contacted with the lab results as soon as they are available. The fastest way to get your results is to activate your My Chart account. Instructions are located on the last page of this paperwork. If you have not heard from [place] regarding the results in 2 weeks, please contact this office.

## 2017-10-21 NOTE — Progress Notes (Signed)
Peggy Church  MRN: [id number] DOB: [birth year]  PCP: [name], MD  Chief Complaint  Patient presents with  . Dental Pain    abscess on jaw follow up numb feeling     Subjective:  Pt presents to clinic for 12-24h h/o tooth concerns.  On 5/31 she was seen here for likely dental abscess involving left upper 1st molar but at the time she was complaining of facial weakness and tingling - she was sent to the ED for rule out stroke.  Her CT brain showed no stroke at that visit.  She was treated for dental abscess and the symptoms got much better though did not completely resolve after she finished th 7 days of clindamycin given to her.  She saw her dentist who did dental films but could find no problems and has referred to do an Endodontist which she has an appt in 3 days for further evaluation.Then last night she started to have the same sensation of tingling and nerve pain in her cheek that she had on 5/31 (though not as bad), sometimes the sensation goes into her forehead and into lower jaw.    The tooth that has been causing the pain had a root canal about 20-25 years ago - she had similar problems at the time.  She has had some foul tasting fluid come from the area that she is complaining about in the last 3 weeks when it is pushed on.   In April she had a deep cleaning at the dentist.   History is obtained by patient.  Review of Systems  Constitutional: Negative for chills and fever.  HENT: Positive for dental problem. Negative for facial swelling.   Neurological: Negative for weakness.    Patient Active Problem List   Diagnosis Date Noted  . BMI 31.0-31.9,adult 11/05/2015  . HTN (hypertension) 06/26/2012    Current Outpatient Medications on File Prior to Visit  Medication Sig Dispense Refill  . amLODipine (NORVASC) 5 MG tablet Take 1 tablet (5 mg total) by mouth daily. 90 tablet 1  . chlorthalidone (HYGROTON) 50 MG tablet Take 2 tablets (100 mg total) by mouth daily.  180 tablet 1  . Multiple Vitamin (MULTI-VITAMINS) TABS Take by mouth daily.    . potassium chloride (K-DUR) 10 MEQ tablet Take 1 tablet (10 mEq total) by mouth daily. 30 tablet 2   No current facility-administered medications on file prior to visit.     Allergies  Allergen Reactions  . Benicar [Olmesartan]     Smells and senses things that aren't really there.  . Lisinopril     cough  . Sulfa Drugs Cross Reactors     Past Medical History:  Diagnosis Date  . Allergy   . Hypertension   . Migraine    Social History   Social History Narrative   Lives with her daughter and fiance.   Social History   Tobacco Use  . Smoking status: Former Smoker    Last attempt to quit: 07/18/1991    Years since quitting: 26.2  . Smokeless tobacco: Never Used  Substance Use Topics  . Alcohol use: Yes    Alcohol/week: 0.0 oz    Comment: rarely  . Drug use: No   family history includes Cancer in her brother and father; Heart disease in her mother; Hyperlipidemia in her father and mother; Hypertension in her father and mother; Stroke in her mother.     Objective:  BP 110/72   Pulse 71  Temp 98.6 F (37 C) (Oral)   Resp 18   Ht 5\' 6"  (1.676 m)   Wt 195 lb 12.8 oz (88.8 kg)   SpO2 98%   BMI 31.60 kg/m  Body mass index is 31.6 kg/m.  Wt Readings from Last 3 Encounters:  10/21/17 195 lb 12.8 oz (88.8 kg)  10/06/17 196 lb (88.9 kg)  10/06/17 195 lb (88.5 kg)    Physical Exam  Constitutional: She is oriented to person, place, and time. She appears well-developed and well-nourished.  HENT:  Head: Normocephalic and atraumatic.  Right Ear: Hearing and external ear normal.  Left Ear: Hearing and external ear normal.  hypertrophic hard palate, no tenderness on the roof of mouth - tenderness of the lateral aspect of the left upper molar - maybe some mild swelling noted - no erythema - no drainage from the area  Eyes: Conjunctivae are normal.  Neck: Normal range of motion.    Pulmonary/Chest: Effort normal.  Neurological: She is alert and oriented to person, place, and time. No cranial nerve deficit (normal smile) or sensory deficit (no sensation change in her face ).  Skin: Skin is warm, dry and intact.  Psychiatric: She has a normal mood and affect. Her behavior is normal. Judgment and thought content normal.  Vitals reviewed.   Assessment and Plan :  Hypokalemia - Plan: Basic metabolic panel -check labs patient has been taking potassium supplement  Dental abscess - Plan: clindamycin (CLEOCIN) 300 MG capsule -due to history will restart clindamycin, a 5-day sample given.  Patient has an endodontist appointment in 3 days and if it needs to be continued at that point the endodontist will continue the clindamycin.  Did discuss with patient the increased risk of C. difficile with the second course of clindamycin.  Hopefully imaging that the endodontist can do will help with this diagnosis as the CT that was done in the ED did not extend into the maxillary sinus region.  Numbness and tingling of left side of face -discussed with patient that this could be related to nerve irritation on the left cheek area.  Also did bring up with patient the possibility of trigeminal neuralgia.  If endodontist cannot find cause of her pain neurologist may be helpful for further evaluation.  Discussed with patient warning signs and when to seek immediate medical care in regards to her current symptoms and how they can change that would cause alarm.  Patient verbalized to me that they understand the following: diagnosis, what is being done for them, what to expect and what should be done at home.  Their questions have been answered.  See after visit summary for patient specific instructions.   Windell Hummingbird PA-C  [facility] 10/21/2017 12:18 PM

## 2017-10-22 LAB — BASIC METABOLIC PANEL
BUN / CREAT RATIO: 16 (ref 12–28)
BUN: 15 mg/dL (ref 8–27)
CALCIUM: 10.1 mg/dL (ref 8.7–10.3)
CO2: 26 mmol/L (ref 20–29)
Chloride: 96 mmol/L (ref 96–106)
Creatinine, Ser: 0.94 mg/dL (ref 0.57–1.00)
GFR calc non Af Amer: 66 mL/min/{1.73_m2} (ref >59)
GFR, EST AFRICAN AMERICAN: 76 mL/min/{1.73_m2} (ref >59)
GLUCOSE: 109 mg/dL — AB (ref 65–99)
Potassium: 3.4 mmol/L — ABNORMAL LOW (ref 3.5–5.2)
Sodium: 137 mmol/L (ref 134–144)

## 2017-10-25 MED ORDER — POTASSIUM CHLORIDE ER 20 MEQ PO TBCR: 20.0000 meq | EXTENDED_RELEASE_TABLET | Freq: Every day | ORAL | 0 refills | Status: DC

## 2017-10-25 MED FILL — CLINDAMYCIN HCL 150 MG CAPS: 150 | 7 days supply | Qty: 28 | Fill #0

## 2017-10-25 MED FILL — HYDROCODON-APAP 5-325: 5-325 | 2 days supply | Qty: 16 | Fill #0

## 2017-10-25 NOTE — Addendum Note (Signed)
Addended by: Mancel Bale on: 10/25/2017 10:59 AM   Modules accepted: Orders

## 2017-10-26 ENCOUNTER — Telehealth: Payer: Self-pay | Admitting: Family Medicine

## 2017-10-26 MED FILL — POTASSIUM CL ER 10 MEQ TABL: 10 | 30 days supply | Qty: 30 | Fill #1

## 2017-10-26 NOTE — Telephone Encounter (Signed)
Copied from [place] [id number]. Topic: Quick Communication - Lab Results >> Oct 26, 2017 12:06 PM Marin Roberts, [place] wrote: Called patient to inform them of 10/21/17 lab results. When patient returns call, triage nurse may disclose results.  Patient is calling back about her lab results  CB # [phone].

## 2017-10-27 NOTE — Telephone Encounter (Signed)
Call to patient- notified of lab results and PCP recommendation. She will increase her dosing of K+ to 20 MEQ and she is going to call back to scheduled recheck level. She did get her tooth pulled and is under treatment for the abscess beneath it. Patient instructed to call office if she needs anything - she will.

## 2017-11-14 ENCOUNTER — Other Ambulatory Visit: Payer: Self-pay | Admitting: Family Medicine

## 2017-11-14 MED FILL — POTASSIUM CL ER 10 MEQ TABL: 10 | 30 days supply | Qty: 30 | Fill #2

## 2017-11-14 NOTE — Telephone Encounter (Signed)
Please clarify potassium dosage. In the chart I see dosage increased to 20 MEQ daily.   LOV  10/21/17  NOV  11/20/17  [facility]   Dr. Carlota Raspberry

## 2017-11-15 NOTE — Telephone Encounter (Signed)
She currently has potassium 10 mEq at home she should increase this to 20 mEq,== taken from result note 10/25/17 Potassium refill Last Refill:10/25/16 #30 tab Last OV: 10/21/17 PCP: Dr Carlota Raspberry Pharmacy:[facility] Last K: 3.4 on 10/21/17

## 2017-11-20 ENCOUNTER — Other Ambulatory Visit: Payer: Self-pay

## 2017-11-20 ENCOUNTER — Ambulatory Visit: Payer: BC Managed Care – PPO | Admitting: Family Medicine

## 2017-11-20 ENCOUNTER — Encounter: Payer: Self-pay | Admitting: Family Medicine

## 2017-11-20 VITALS — BP 109/73 | HR 70 | Temp 98.5°F | Ht 66.0 in | Wt 195.8 lb

## 2017-11-20 DIAGNOSIS — Z1159 Encounter for screening for other viral diseases: Secondary | ICD-10-CM | POA: Diagnosis not present

## 2017-11-20 DIAGNOSIS — Z114 Encounter for screening for human immunodeficiency virus [HIV]: Secondary | ICD-10-CM

## 2017-11-20 DIAGNOSIS — Z1231 Encounter for screening mammogram for malignant neoplasm of breast: Secondary | ICD-10-CM

## 2017-11-20 DIAGNOSIS — E876 Hypokalemia: Secondary | ICD-10-CM

## 2017-11-20 DIAGNOSIS — Z1239 Encounter for other screening for malignant neoplasm of breast: Secondary | ICD-10-CM

## 2017-11-20 DIAGNOSIS — Z1211 Encounter for screening for malignant neoplasm of colon: Secondary | ICD-10-CM | POA: Diagnosis not present

## 2017-11-20 DIAGNOSIS — L989 Disorder of the skin and subcutaneous tissue, unspecified: Secondary | ICD-10-CM

## 2017-11-20 MED ORDER — POTASSIUM CHLORIDE ER 20 MEQ PO TBCR: 20.0000 meq | EXTENDED_RELEASE_TABLET | Freq: Every day | ORAL | 1 refills | Status: DC

## 2017-11-20 NOTE — Progress Notes (Signed)
Subjective:  By signing my name below, I, Peggy Church, attest that this documentation has been prepared under the direction and in the presence of Merri Ray, MD. Electronically Signed: Moises Church, [place]. 11/20/2017 , 11:17 AM .  Patient was seen in Room 11 .   Patient ID: Peggy Church, female    DOB: Apr 15, 1956, 62 y.o.   MRN: [id number] Chief Complaint  Patient presents with  . potassium check    was low last time    HPI Peggy Church is a 62 y.o. female Here for follow up. She has a history of HTN and hypokalemia.   HTN BP Readings from Last 3 Encounters:  11/20/17 109/73  10/21/17 110/72  10/06/17 (!) 115/57   Lab Results  Component Value Date   CREATININE 0.94 10/21/2017   She takes Norvasc 5 mg qd and chlorthalidone 50 mg qd.   Hypokalemia She was most recently treated with potassium 20 mEq supplements qd, which was increased from 10 mEq previously. Here for recheck of hypokalemia on higher dose of potassium.   She states she feels overall better on higher dose of potassium. She denies palpitations.   Health maintenance No recent physical, due for health maintenance.   HIV screening and Hep C screening: s Colonoscopy: last done a long time ago in [place] state; she had a polyp, and informed repeat in 5 years.  Mammogram: she states she's had several mammograms done in the past with biopsies of benign tumors, done in [place] states; hasn't had any done locally.  Pap test: last done a few years ago, in 2013. She denies being followed by OBGYN.  Facial bumps: she requests dermatology referral regarding bumps around her face; she's had a few areas removed a few years ago. She denies history of skin cancers.   Patient Active Problem List   Diagnosis Date Noted  . BMI 31.0-31.9,adult 11/05/2015  . HTN (hypertension) 06/26/2012   Past Medical History:  Diagnosis Date  . Allergy   . Hypertension   . Migraine    Past Surgical History:    Procedure Laterality Date  . [place]   stretched  . ECTOPIC PREGNANCY SURGERY    . TONSILLECTOMY AND ADENOIDECTOMY    . TUBAL LIGATION     Allergies  Allergen Reactions  . Benicar [Olmesartan]     Smells and senses things that aren't really there.  . Lisinopril     cough  . Sulfa Drugs Cross Reactors    Prior to Admission medications   Medication Sig Start Date End Date Taking? Authorizing Provider  amLODipine (NORVASC) 5 MG tablet Take 1 tablet (5 mg total) by mouth daily. 09/01/17  Yes [name], MD  chlorthalidone (HYGROTON) 50 MG tablet Take 2 tablets (100 mg total) by mouth daily. 09/01/17  Yes [name], MD  Multiple Vitamin (MULTI-VITAMINS) TABS Take by mouth daily.   Yes [provider]  potassium chloride 20 MEQ TBCR Take 20 mEq by mouth daily. 10/25/17  Yes Weber, Damaris Hippo, PA-C   Social History   Socioeconomic History  . Marital status: Divorced    Spouse name: n/a  . Number of children: 3  . Years of education: college  . Highest education level: Not on file  Occupational History  . Occupation: Consulting civil engineer    Comment: [employer]  . Financial resource strain: Not on file  . Food insecurity:    Worry: Not on file    Inability:  Not on file  . Transportation needs:    Medical: Not on file    Non-medical: Not on file  Tobacco Use  . Smoking status: Former Smoker    Last attempt to quit: 07/18/1991    Years since quitting: 26.3  . Smokeless tobacco: Never Used  Substance and Sexual Activity  . Alcohol use: Yes    Alcohol/week: 0.0 oz    Comment: rarely  . Drug use: No  . Sexual activity: Not on file  Lifestyle  . Physical activity:    Days per week: Not on file    Minutes per session: Not on file  . Stress: Not on file  Relationships  . Social connections:    Talks on phone: Not on file    Gets together: Not on file    Attends religious service: Not on file    Active member of club or  organization: Not on file    Attends meetings of clubs or organizations: Not on file    Relationship status: Not on file  . Intimate partner violence:    Fear of current or ex partner: Not on file    Emotionally abused: Not on file    Physically abused: Not on file    Forced sexual activity: Not on file  Other Topics Concern  . Not on file  Social History Narrative   Lives with her daughter and fiance.   Review of Systems  Constitutional: Negative for fatigue and unexpected weight change.  Respiratory: Negative for chest tightness and shortness of breath.   Cardiovascular: Negative for chest pain, palpitations and leg swelling.  Gastrointestinal: Negative for abdominal pain and Church in stool.  Neurological: Negative for dizziness, syncope, light-headedness and headaches.       Objective:   Physical Exam  Constitutional: She is oriented to person, place, and time. She appears well-developed and well-nourished.  HENT:  Head: Normocephalic and atraumatic.  There are 2 flesh colored small 1-2 mm nodular area left medial canthi and upper nose; there is 1 lesion lateral to the right eye, no surrounding erythema  Eyes: Pupils are equal, round, and reactive to light. Conjunctivae and EOM are normal.  Neck: Carotid bruit is not present.  Cardiovascular: Normal rate, regular rhythm, normal heart sounds and intact distal pulses.  Pulmonary/Chest: Effort normal and breath sounds normal.  Abdominal: Soft. She exhibits no pulsatile midline mass. There is no tenderness.  Neurological: She is alert and oriented to person, place, and time.  Skin: Skin is warm and dry.  Psychiatric: She has a normal mood and affect. Her behavior is normal.  Vitals reviewed.   Vitals:   11/20/17 1053  BP: 109/73  Pulse: 70  Temp: 98.5 F (36.9 C)  TempSrc: Oral  SpO2: 96%  Weight: 195 lb 12.8 oz (88.8 kg)  Height: 5\' 6"  (1.676 m)       Assessment & Plan:   Peggy Church is a 62 y.o.  female Hypokalemia - Plan: Basic metabolic panel, Potassium Chloride ER 20 MEQ TBCR  -Check BMP, continue same dose potassium for now.  Health maintenance : need for hepatitis C screening test - Plan: Hepatitis C antibody Screening for HIV (human immunodeficiency virus) - Plan: HIV antibody Special screening for malignant neoplasms, colon - Plan: Ambulatory referral to Gastroenterology Screening for breast cancer - Plan: MM Digital Screening  Multiple lesions of face - Plan: Ambulatory referral to Dermatology  -xanthoma versus sebaceous versus skin tags.  Refer to dermatology for further evaluation  Meds ordered this encounter  Medications  . Potassium Chloride ER 20 MEQ TBCR    Sig: Take 20 mEq by mouth daily.    Dispense:  90 tablet    Refill:  1   Patient Instructions    Continue same dose of potassium for now.  I will recheck levels today.  I also checked other Church work for hepatitis C, HIV as we discussed.  I referred you for mammogram and for colonoscopy, as well as a referral to a dermatologist.  Please schedule appointment for a physical so we can complete Pap testing at that time.  Thank you for coming in today.   IF you received an x-ray today, you will receive an invoice from [facility]. Please contact [facility] at [phone] with questions or concerns regarding your invoice.   IF you received labwork today, you will receive an invoice from [place]. Please contact [facility] at [phone] with questions or concerns regarding your invoice.   Our billing staff will not be able to assist you with questions regarding bills from these companies.  You will be contacted with the lab results as soon as they are available. The fastest way to get your results is to activate your My Chart account. Instructions are located on the last page of this paperwork. If you have not heard from [place] regarding the results in 2 weeks, please contact this office.        I personally performed the services described in this documentation, which was scribed in my presence. The recorded information has been reviewed and considered for accuracy and completeness, addended by me as needed, and agree with information above.  Signed,   Merri Ray, MD [facility].  11/22/17 4:06 PM

## 2017-11-20 NOTE — Patient Instructions (Addendum)
  Continue same dose of potassium for now.  I will recheck levels today.  I also checked other blood work for hepatitis C, HIV as we discussed.  I referred you for mammogram and for colonoscopy, as well as a referral to a dermatologist.  Please schedule appointment for a physical so we can complete Pap testing at that time.  Thank you for coming in today.   IF you received an x-ray today, you will receive an invoice from [facility]. Please contact [facility] at [phone] with questions or concerns regarding your invoice.   IF you received labwork today, you will receive an invoice from [facility]. Please contact [facility] at [phone] with questions or concerns regarding your invoice.   Our billing staff will not be able to assist you with questions regarding bills from these companies.  You will be contacted with the lab results as soon as they are available. The fastest way to get your results is to activate your My Chart account. Instructions are located on the last page of this paperwork. If you have not heard from [place] regarding the results in 2 weeks, please contact this office.

## 2017-11-21 LAB — BASIC METABOLIC PANEL
BUN / CREAT RATIO: 20 (ref 12–28)
BUN: 18 mg/dL (ref 8–27)
CO2: 26 mmol/L (ref 20–29)
CREATININE: 0.91 mg/dL (ref 0.57–1.00)
Calcium: 9.9 mg/dL (ref 8.7–10.3)
Chloride: 97 mmol/L (ref 96–106)
GFR, EST AFRICAN AMERICAN: 79 mL/min/{1.73_m2} (ref >59)
GFR, EST NON AFRICAN AMERICAN: 68 mL/min/{1.73_m2} (ref >59)
GLUCOSE: 111 mg/dL — AB (ref 65–99)
Potassium: 3.2 mmol/L — ABNORMAL LOW (ref 3.5–5.2)
Sodium: 140 mmol/L (ref 134–144)

## 2017-11-21 LAB — HEPATITIS C ANTIBODY: Hep C Virus Ab: <0.1 s/co ratio (ref 0.0–0.9)

## 2017-11-21 LAB — HIV ANTIBODY (ROUTINE TESTING W REFLEX): HIV Screen 4th Generation wRfx: NONREACTIVE

## 2017-11-29 MED FILL — POTASSIUM CL ER 20 MEQ TABL: 20 | 90 days supply | Qty: 90 | Fill #0

## 2017-12-14 ENCOUNTER — Telehealth: Payer: Self-pay | Admitting: Family Medicine

## 2017-12-15 ENCOUNTER — Ambulatory Visit (INDEPENDENT_AMBULATORY_CARE_PROVIDER_SITE_OTHER): Payer: BC Managed Care – PPO | Admitting: Family Medicine

## 2017-12-15 ENCOUNTER — Other Ambulatory Visit: Payer: Self-pay

## 2017-12-15 ENCOUNTER — Encounter: Payer: Self-pay | Admitting: Family Medicine

## 2017-12-15 VITALS — BP 124/72 | HR 65 | Temp 98.5°F | Ht 66.0 in | Wt 198.8 lb

## 2017-12-15 DIAGNOSIS — I10 Essential (primary) hypertension: Secondary | ICD-10-CM | POA: Diagnosis not present

## 2017-12-15 DIAGNOSIS — Z0000 Encounter for general adult medical examination without abnormal findings: Secondary | ICD-10-CM

## 2017-12-15 DIAGNOSIS — Z6832 Body mass index (BMI) 32.0-32.9, adult: Secondary | ICD-10-CM

## 2017-12-15 DIAGNOSIS — Z124 Encounter for screening for malignant neoplasm of cervix: Secondary | ICD-10-CM

## 2017-12-15 DIAGNOSIS — R739 Hyperglycemia, unspecified: Secondary | ICD-10-CM

## 2017-12-15 DIAGNOSIS — E876 Hypokalemia: Secondary | ICD-10-CM

## 2017-12-15 DIAGNOSIS — K5900 Constipation, unspecified: Secondary | ICD-10-CM

## 2017-12-15 DIAGNOSIS — E785 Hyperlipidemia, unspecified: Secondary | ICD-10-CM | POA: Diagnosis not present

## 2017-12-15 LAB — BASIC METABOLIC PANEL
BUN/Creatinine Ratio: 12 (ref 12–28)
BUN: 12 mg/dL (ref 8–27)
CO2: 24 mmol/L (ref 20–29)
CREATININE: 1.01 mg/dL — AB (ref 0.57–1.00)
Calcium: 9.8 mg/dL (ref 8.7–10.3)
Chloride: 99 mmol/L (ref 96–106)
GFR calc Af Amer: 69 mL/min/{1.73_m2} (ref >59)
GFR calc non Af Amer: 60 mL/min/{1.73_m2} (ref >59)
GLUCOSE: 113 mg/dL — AB (ref 65–99)
Potassium: 3.2 mmol/L — ABNORMAL LOW (ref 3.5–5.2)
Sodium: 139 mmol/L (ref 134–144)

## 2017-12-15 LAB — HEMOGLOBIN A1C
ESTIMATED AVERAGE GLUCOSE: 108 mg/dL
HEMOGLOBIN A1C: 5.4 % (ref 4.8–5.6)

## 2017-12-15 NOTE — Patient Instructions (Addendum)
I will refer you to lipid clinic to discuss options for high cholesterol.   I will check A1C and other labs as discussed.   We recommend that you schedule a mammogram for breast cancer screening. Typically, you do not need a referral to do this. [facility] - [phone] or [phone]   Please try to let me know the date of your last tetanus.   Increase exercise to 150 minutes per week and watch diet to help with weight loss.   Colace as stool softener if needed or miralax for more constipation. Follow up if that is not improving.   If knee swelling returns or any worsening of urinary symptoms, please return for recheck.   Thank you for coming in today.   Constipation, Adult Constipation is when a person has fewer bowel movements in a week than normal, has difficulty having a bowel movement, or has stools that are dry, hard, or larger than normal. Constipation may be caused by an underlying condition. It may become worse with age if a person takes certain medicines and does not take in enough fluids. Follow these instructions at home: Eating and drinking   Eat foods that have a lot of fiber, such as fresh fruits and vegetables, whole grains, and beans.  Limit foods that are high in fat, low in fiber, or overly processed, such as french fries, hamburgers, cookies, candies, and soda.  Drink enough fluid to keep your urine clear or pale yellow. General instructions  Exercise regularly or as told by your health care provider.  Go to the restroom when you have the urge to go. Do not hold it in.  Take over-the-counter and prescription medicines only as told by your health care provider. These include any fiber supplements.  Practice pelvic floor retraining exercises, such as deep breathing while relaxing the lower abdomen and pelvic floor relaxation during bowel movements.  Watch your condition for any changes.  Keep all follow-up visits as told  by your health care provider. This is important. Contact a health care provider if:  You have pain that gets worse.  You have a fever.  You do not have a bowel movement after 4 days.  You vomit.  You are not hungry.  You lose weight.  You are bleeding from the anus.  You have thin, pencil-like stools. Get help right away if:  You have a fever and your symptoms suddenly get worse.  You leak stool or have blood in your stool.  Your abdomen is bloated.  You have severe pain in your abdomen.  You feel dizzy or you faint. This information is not intended to replace advice given to you by your health care provider. Make sure you discuss any questions you have with your health care provider. Document Released: 01/22/2004 Document Revised: 11/13/2015 Document Reviewed: 10/14/2015 Elsevier Interactive Patient Education  2018 [employer].   IF you received an x-ray today, you will receive an invoice from [facility]. Please contact [facility] at [phone] with questions or concerns regarding your invoice.   IF you received labwork today, you will receive an invoice from [place]. Please contact [facility] at [phone] with questions or concerns regarding your invoice.   Our billing staff will not be able to assist you with questions regarding bills from these companies.  You will be contacted with the lab results as soon as they are available. The fastest way to get your results is to activate your My Chart  account. Instructions are located on the last page of this paperwork. If you have not heard from [place] regarding the results in 2 weeks, please contact this office.

## 2017-12-15 NOTE — Progress Notes (Signed)
Subjective:    Patient ID: Peggy Church, female    DOB: [birth year], 62 y.o.   MRN: [id number]  HPI Peggy Church is a 62 y.o. female Presents today for: Chief Complaint  Patient presents with  . Annual Exam    CPE with pap   History of hypertension with hypokalemia.  Here for annual exam.  Hypertension: BP Readings from Last 3 Encounters:  12/15/17 124/72  11/20/17 109/73  10/21/17 110/72   Lab Results  Component Value Date   CREATININE 0.91 11/20/2017  Treated with amlodipine 5 mg daily, chlorthalidone 100 mg daily, potassium 20 mEq supplement daily.  Previously had been on 10 mEq.  Advised to increase to 40 mEq/day July 15 and recommended repeat testing within 1 week. Has been taking 69meq qd since last visit.   Lab Results  Component Value Date   K 3.2 (L) 11/20/2017   Lipid screening/HLD: Lab Results  Component Value Date   CHOL 302 (H) 09/01/2017   HDL 46 09/01/2017   LDLCALC 215 (H) 09/01/2017   LDLDIRECT 209.1 08/31/2011   TRIG 205 (H) 09/01/2017   CHOLHDL 6.6 (H) 09/01/2017  planned on follow up visit to discuss meds, but had not discussed yet. No current statin.  lipitor and other statins in past, had muscle aches with all statins. Niacin caused too much flushing.    Hyperglycemia: Glucose 109 and 111 on most recent evaluation.  Plan for A1c today.   Hep C testing and HIV testing nonreactive/negative last visit.  Cancer screening: Colon cancer screening, referred for colonoscopy last visit. In process of scheduling.  Breast cancer screening: Ordered last visit, not yet performed. Phone number provided.  Cervical cancer screening: 08/31/2011 Pap.  Plans for repeat today. Normal on recent testing, abnormal in 80's. No procedures.   Immunizations:  There is no immunization history on file for this patient.  Tetanus:last given about 4 or 5 years ago.  Shingles: not interested/declined.   Depression screening: Depression screen [facility] 2/9 12/15/2017  11/20/2017 10/21/2017 10/06/2017 09/01/2017  Decreased Interest 0 0 0 0 0  Down, Depressed, Hopeless 0 0 0 0 0  PHQ - 2 Score 0 0 0 0 0   Vision screening:  Visual Acuity Screening   Right eye Left eye Both eyes  Without correction:     With correction: 20/15 20/20-1 20/15  saw optho last week.   Dental: Followed regularly Q40mo  Constipation: Recurring issue. Tried probiotics.   Exercise/obesity: Body mass index is 32.09 kg/m. Run around with jobs. Gardening. Prior hiking. Rare fast food, no sugar beverages.  Wt Readings from Last 3 Encounters:  12/15/17 198 lb 12.8 oz (90.2 kg)  11/20/17 195 lb 12.8 oz (88.8 kg)  10/21/17 195 lb 12.8 oz (88.8 kg)     Patient Active Problem List   Diagnosis Date Noted  . BMI 31.0-31.9,adult 11/05/2015  . HTN (hypertension) 06/26/2012   Past Medical History:  Diagnosis Date  . Allergy   . Hypertension   . Migraine    Past Surgical History:  Procedure Laterality Date  . [place]   stretched  . ECTOPIC PREGNANCY SURGERY    . TONSILLECTOMY AND ADENOIDECTOMY    . TUBAL LIGATION     Allergies  Allergen Reactions  . Benicar [Olmesartan]     Smells and senses things that aren't really there.  . Lisinopril     cough  . Sulfa Drugs Cross Reactors    Prior to Admission medications   Medication  Sig Start Date End Date Taking? Authorizing Provider  amLODipine (NORVASC) 5 MG tablet Take 1 tablet (5 mg total) by mouth daily. 09/01/17  Yes [name], MD  chlorthalidone (HYGROTON) 50 MG tablet Take 2 tablets (100 mg total) by mouth daily. 09/01/17  Yes [name], MD  Multiple Vitamin (MULTI-VITAMINS) TABS Take by mouth daily.   Yes [provider]  Potassium Chloride ER 20 MEQ TBCR Take 20 mEq by mouth daily. 11/20/17  Yes [name], MD   Social History   Socioeconomic History  . Marital status: Divorced    Spouse name: n/a  . Number of children: 3  . Years of education: college  . Highest  education level: Not on file  Occupational History  . Occupation: Consulting civil engineer    Comment: [employer]  . Financial resource strain: Not on file  . Food insecurity:    Worry: Not on file    Inability: Not on file  . Transportation needs:    Medical: Not on file    Non-medical: Not on file  Tobacco Use  . Smoking status: Former Smoker    Last attempt to quit: 07/18/1991    Years since quitting: 26.4  . Smokeless tobacco: Never Used  Substance and Sexual Activity  . Alcohol use: Yes    Alcohol/week: 0.0 standard drinks    Comment: rarely  . Drug use: No  . Sexual activity: Not on file  Lifestyle  . Physical activity:    Days per week: Not on file    Minutes per session: Not on file  . Stress: Not on file  Relationships  . Social connections:    Talks on phone: Not on file    Gets together: Not on file    Attends religious service: Not on file    Active member of club or organization: Not on file    Attends meetings of clubs or organizations: Not on file    Relationship status: Not on file  . Intimate partner violence:    Fear of current or ex partner: Not on file    Emotionally abused: Not on file    Physically abused: Not on file    Forced sexual activity: Not on file  Other Topics Concern  . Not on file  Social History Narrative   Lives with her daughter and fiance.    Review of Systems  HENT: Positive for dental problem (followed by dentist. ).   Gastrointestinal: Positive for constipation.  Genitourinary: Positive for frequency (longstanding, no recent change in sx's. ).  Musculoskeletal: Positive for joint swelling (a few times in knees. doing ok now. ).  Allergic/Immunologic: Positive for food allergies.       Objective:   Physical Exam  Constitutional: She is oriented to person, place, and time. She appears well-developed and well-nourished.  HENT:  Head: Normocephalic and atraumatic.  Right Ear: External ear normal.  Left Ear:  External ear normal.  Mouth/Throat: Oropharynx is clear and moist.  Eyes: Pupils are equal, round, and reactive to light. Conjunctivae are normal.  Neck: Normal range of motion. Neck supple. No thyromegaly present.  Cardiovascular: Normal rate, regular rhythm, normal heart sounds and intact distal pulses.  No murmur heard. Pulmonary/Chest: Effort normal and breath sounds normal. No respiratory distress. She has no wheezes.  Abdominal: Soft. Bowel sounds are normal. There is no tenderness.  Genitourinary: There is no rash or tenderness on the right labia. There is no rash or  tenderness on the left labia. Right adnexum displays no mass and no tenderness. Left adnexum displays no mass and no tenderness. There is bleeding (minimal at cervix after pap) in the vagina.  Musculoskeletal: Normal range of motion. She exhibits no edema or tenderness.  Lymphadenopathy:    She has no cervical adenopathy.  Neurological: She is alert and oriented to person, place, and time.  Skin: Skin is warm and dry. No rash noted.  Psychiatric: She has a normal mood and affect. Her behavior is normal. Thought content normal.   Vitals:   12/15/17 0808  BP: 124/72  Pulse: 65  Temp: 98.5 F (36.9 C)  TempSrc: Oral  SpO2: 95%  Weight: 198 lb 12.8 oz (90.2 kg)  Height: 5\' 6"  (1.676 m)      Assessment & Plan:   Lenny Bouchillon is a 62 y.o. female Annual physical exam  - -anticipatory guidance as below in AVS, screening labs above. Health maintenance items as above in HPI discussed/recommended as applicable.   -Schedule mammogram, check on date of last tetanus  Essential hypertension Hypokalemia - Plan: Basic metabolic panel  -Stable blood pressure, tolerating higher dose of potassium.  Check potassium levels.  No new med changes  Screening for cervical cancer - Plan: Pap IG w/ reflex to HPV when ASC-U  Hyperlipidemia, unspecified hyperlipidemia type - Plan: AMB Referral to [facility]  -Significant hyperlipidemia, typically would recommend statin but has tried multiple statins and niacin was unable to tolerate.  Will refer to lipid clinic to decide on other options and possible PCSK9 inhibitor.  Advised to increase amount of exercise for now.  Hyperglycemia - Plan: Hemoglobin A1c  -Increase exercise discussed, check A1c to screen for diabetes/prediabetes.  Constipation, unspecified constipation type  -Colace discussed a stool softener, MiraLAX as needed.  Handout given on constipation with prevention techniques discussed.  RTC precautions if worse  BMI 32.0-32.9,adult  -Increased exercise and diet discussed.  Recheck 3 to 6 months.  No orders of the defined types were placed in this encounter.  Patient Instructions    I will refer you to lipid clinic to discuss options for high cholesterol.   I will check A1C and other labs as discussed.   We recommend that you schedule a mammogram for breast cancer screening. Typically, you do not need a referral to do this. [facility] ([place] - [phone] or [phone]   Please try to let me know the date of your last tetanus.   Increase exercise to 150 minutes per week and watch diet to help with weight loss.   Colace as stool softener if needed or miralax for more constipation. Follow up if that is not improving.   If knee swelling returns or any worsening of urinary symptoms, please return for recheck.   Thank you for coming in today.   Constipation, Adult Constipation is when a person has fewer bowel movements in a week than normal, has difficulty having a bowel movement, or has stools that are dry, hard, or larger than normal. Constipation may be caused by an underlying condition. It may become worse with age if a person takes certain medicines and does not take in enough fluids. Follow these instructions at home: Eating and drinking   Eat foods that have a lot of fiber, such as  fresh fruits and vegetables, whole grains, and beans.  Limit foods that are high in fat, low in fiber, or overly processed, such as french fries,  hamburgers, cookies, candies, and soda.  Drink enough fluid to keep your urine clear or pale yellow. General instructions  Exercise regularly or as told by your health care provider.  Go to the restroom when you have the urge to go. Do not hold it in.  Take over-the-counter and prescription medicines only as told by your health care provider. These include any fiber supplements.  Practice pelvic floor retraining exercises, such as deep breathing while relaxing the lower abdomen and pelvic floor relaxation during bowel movements.  Watch your condition for any changes.  Keep all follow-up visits as told by your health care provider. This is important. Contact a health care provider if:  You have pain that gets worse.  You have a fever.  You do not have a bowel movement after 4 days.  You vomit.  You are not hungry.  You lose weight.  You are bleeding from the anus.  You have thin, pencil-like stools. Get help right away if:  You have a fever and your symptoms suddenly get worse.  You leak stool or have blood in your stool.  Your abdomen is bloated.  You have severe pain in your abdomen.  You feel dizzy or you faint. This information is not intended to replace advice given to you by your health care provider. Make sure you discuss any questions you have with your health care provider. Document Released: 01/22/2004 Document Revised: 11/13/2015 Document Reviewed: 10/14/2015 Elsevier Interactive Patient Education  2018 [employer].   IF you received an x-ray today, you will receive an invoice from [facility]. Please contact [facility] at [phone] with questions or concerns regarding your invoice.   IF you received labwork today, you will receive an invoice from [facility]. Please contact [facility] at  [phone] with questions or concerns regarding your invoice.   Our billing staff will not be able to assist you with questions regarding bills from these companies.  You will be contacted with the lab results as soon as they are available. The fastest way to get your results is to activate your My Chart account. Instructions are located on the last page of this paperwork. If you have not heard from [place] regarding the results in 2 weeks, please contact this office.       Signed,   Merri Ray, MD [facility].  12/16/17 11:05 AM

## 2017-12-20 LAB — PAP IG W/ RFLX HPV ASCU: PAP SMEAR COMMENT: 0

## 2017-12-20 MED FILL — AMLODIPINE BESYLATE 5 MG TA: 5 | 90 days supply | Qty: 90 | Fill #1

## 2017-12-20 MED FILL — CHLORTHALIDONE 50 MG TABS: 50 | 90 days supply | Qty: 180 | Fill #1

## 2017-12-25 NOTE — Telephone Encounter (Signed)
Copied from [place] [id number]. Topic: Quick Communication - Lab Results >> Dec 25, 2017 12:17 PM Oliver Pila B wrote: Reason for CRM: contact pt back about labs; no release written for [facility] Triage to give results

## 2017-12-26 ENCOUNTER — Telehealth: Payer: Self-pay

## 2017-12-26 NOTE — Telephone Encounter (Signed)
Lm to call for results crm in

## 2017-12-26 NOTE — Telephone Encounter (Signed)
Pt. Given results and instructions. Verbalizes understanding.Appointment made for follow up.

## 2018-01-01 ENCOUNTER — Encounter: Payer: Self-pay | Admitting: *Deleted

## 2018-01-02 ENCOUNTER — Encounter: Payer: Self-pay | Admitting: Family Medicine

## 2018-01-04 ENCOUNTER — Ambulatory Visit: Payer: BC Managed Care – PPO | Admitting: Family Medicine

## 2018-01-04 ENCOUNTER — Encounter: Payer: Self-pay | Admitting: Family Medicine

## 2018-01-04 VITALS — BP 111/72 | HR 66 | Temp 98.1°F | Ht 66.0 in | Wt 200.0 lb

## 2018-01-04 DIAGNOSIS — I10 Essential (primary) hypertension: Secondary | ICD-10-CM | POA: Diagnosis not present

## 2018-01-04 DIAGNOSIS — E876 Hypokalemia: Secondary | ICD-10-CM | POA: Diagnosis not present

## 2018-01-04 NOTE — Patient Instructions (Addendum)
Stop chlorthalidone for now, return to 53meq potassium for now.  I will check levels today.  continue amlodipine same dose for now.  I will check potassium again today, but also repeat bloodwork in 5 days as lab only visit.  Recheck with me in 1 week to decide on next step and we can decide if other workup for possible secondary cause of low potassium.   If blood pressure over 140/90 - take 2 amlodipine.  That may increase ankle swelling, so if higher dose needed, we can look at other options next week.   Return to the clinic or go to the nearest emergency room if any of your symptoms worsen or new symptoms occur.   Hypokalemia Hypokalemia means that the amount of potassium in the blood is lower than normal.Potassium is a chemical that helps regulate the amount of fluid in the body (electrolyte). It also stimulates muscle tightening (contraction) and helps nerves work properly.Normally, most of the body's potassium is inside of cells, and only a very small amount is in the blood. Because the amount in the blood is so small, minor changes to potassium levels in the blood can be life-threatening. What are the causes? This condition may be caused by:  Antibiotic medicine.  Diarrhea or vomiting. Taking too much of a medicine that helps you have a bowel movement (laxative) can cause diarrhea and lead to hypokalemia.  Chronic kidney disease (CKD).  Medicines that help the body get rid of excess fluid (diuretics).  Eating disorders, such as bulimia.  Low magnesium levels in the body.  Sweating a lot.  What are the signs or symptoms? Symptoms of this condition include:  Weakness.  Constipation.  Fatigue.  Muscle cramps.  Mental confusion.  Skipped heartbeats or irregular heartbeat (palpitations).  Tingling or numbness.  How is this diagnosed? This condition is diagnosed with a blood test. How is this treated? Hypokalemia can be treated by taking potassium supplements by  mouth or adjusting the medicines that you take. Treatment may also include eating more foods that contain a lot of potassium. If your potassium level is very low, you may need to get potassium through an IV tube in one of your veins and be monitored in the hospital. Follow these instructions at home:  Take over-the-counter and prescription medicines only as told by your health care provider. This includes vitamins and supplements.  Eat a healthy diet. A healthy diet includes fresh fruits and vegetables, whole grains, healthy fats, and lean proteins.  If instructed, eat more foods that contain a lot of potassium, such as: ? Nuts, such as peanuts and pistachios. ? Seeds, such as sunflower seeds and pumpkin seeds. ? Peas, lentils, and lima beans. ? Whole grain and bran cereals and breads. ? Fresh fruits and vegetables, such as apricots, avocado, bananas, cantaloupe, kiwi, oranges, tomatoes, asparagus, and potatoes. ? Orange juice. ? Tomato juice. ? Red meats. ? Yogurt.  Keep all follow-up visits as told by your health care provider. This is important. Contact a health care provider if:  You have weakness that gets worse.  You feel your heart pounding or racing.  You vomit.  You have diarrhea.  You have diabetes (diabetes mellitus) and you have trouble keeping your blood sugar (glucose) in your target range. Get help right away if:  You have chest pain.  You have shortness of breath.  You have vomiting or diarrhea that lasts for more than 2 days.  You faint. This information is not intended  to replace advice given to you by your health care provider. Make sure you discuss any questions you have with your health care provider. Document Released: 04/25/2005 Document Revised: 12/12/2015 Document Reviewed: 12/12/2015 Elsevier Interactive Patient Education  [employer].   If you have lab work done today you will be contacted with your lab results within the next 2 weeks.  If  you have not heard from [place] then please contact us. The fastest way to get your results is to register for My Chart.   IF you received an x-ray today, you will receive an invoice from [facility]. Please contact [facility] at [phone] with questions or concerns regarding your invoice.   IF you received labwork today, you will receive an invoice from [place] [facility]. Please contact [facility] at [phone] with questions or concerns regarding your invoice.   Our billing staff will not be able to assist you with questions regarding bills from these companies.  You will be contacted with the lab results as soon as they are available. The fastest way to get your results is to activate your My Chart account. Instructions are located on the last page of this paperwork. If you have not heard from [place] regarding the results in 2 weeks, please contact this office.

## 2018-01-04 NOTE — Progress Notes (Signed)
Subjective:    Patient ID: Peggy Church, female    DOB: [birth year], 62 y.o.   MRN: [id number]  HPI Peggy Church is a 62 y.o. female Presents today for: Chief Complaint  Patient presents with  . Low K    recheck    Last seen August in for a physical.  She has a history of hypertension, hypokalemia.  Treated with amlodipine 5 mg daily, chlorthalidone 100 mg daily, potassium 34mEq daily for hypokalemia.  Previously had been on 10 mEq, and 20 mEq.  40 mg dose present since July 15th.   Potassium low at 3.2 on August 9 labs.  Recommended additional 20 mEq for 3 days and follow-up during that time.  Has been on 68meq of potassium past 9 days. Missed 3rd pill yesterday, but took normally today. No chest pains, heart palpitations or dizziness.  No new symptoms.   Some hirsutism with hair above upper lip primarily, noted after menopause.  Denies new abdominal striae, but has had weight gain - primarily central past few years.  Wt Readings from Last 3 Encounters:  01/04/18 200 lb (90.7 kg)  12/15/17 198 lb 12.8 oz (90.2 kg)  11/20/17 195 lb 12.8 oz (88.8 kg)   Patient Active Problem List   Diagnosis Date Noted  . BMI 31.0-31.9,adult 11/05/2015  . HTN (hypertension) 06/26/2012   Past Medical History:  Diagnosis Date  . Allergy   . Hypertension   . Migraine    Past Surgical History:  Procedure Laterality Date  . [place]   stretched  . ECTOPIC PREGNANCY SURGERY    . TONSILLECTOMY AND ADENOIDECTOMY    . TUBAL LIGATION     Allergies  Allergen Reactions  . Benicar [Olmesartan]     Smells and senses things that aren't really there.  . Lisinopril     cough  . Sulfa Drugs Cross Reactors    Prior to Admission medications   Medication Sig Start Date End Date Taking? Authorizing Provider  amLODipine (NORVASC) 5 MG tablet Take 1 tablet (5 mg total) by mouth daily. 09/01/17  Yes [name], MD  chlorthalidone (HYGROTON) 50 MG tablet Take 2 tablets (100 mg total)  by mouth daily. 09/01/17  Yes [name], MD  Multiple Vitamin (MULTI-VITAMINS) TABS Take by mouth daily.   Yes [provider]  Potassium Chloride ER 20 MEQ TBCR Take 20 mEq by mouth daily. Patient taking differently: Take 30 mEq by mouth daily.  11/20/17  Yes [name], MD   Social History   Socioeconomic History  . Marital status: Divorced    Spouse name: n/a  . Number of children: 3  . Years of education: college  . Highest education level: Not on file  Occupational History  . Occupation: Consulting civil engineer    Comment: [employer]  . Financial resource strain: Not on file  . Food insecurity:    Worry: Not on file    Inability: Not on file  . Transportation needs:    Medical: Not on file    Non-medical: Not on file  Tobacco Use  . Smoking status: Former Smoker    Last attempt to quit: 07/18/1991    Years since quitting: 26.4  . Smokeless tobacco: Never Used  Substance and Sexual Activity  . Alcohol use: Yes    Alcohol/week: 0.0 standard drinks    Comment: rarely  . Drug use: No  . Sexual activity: Not on file  Lifestyle  .  Physical activity:    Days per week: Not on file    Minutes per session: Not on file  . Stress: Not on file  Relationships  . Social connections:    Talks on phone: Not on file    Gets together: Not on file    Attends religious service: Not on file    Active member of club or organization: Not on file    Attends meetings of clubs or organizations: Not on file    Relationship status: Not on file  . Intimate partner violence:    Fear of current or ex partner: Not on file    Emotionally abused: Not on file    Physically abused: Not on file    Forced sexual activity: Not on file  Other Topics Concern  . Not on file  Social History Narrative   Lives with her daughter and fiance.     Review of Systems Per hpi.     Objective:   Physical Exam  Constitutional: She is oriented to person, place, and  time. She appears well-developed and well-nourished.  HENT:  Head: Normocephalic and atraumatic.  Eyes: Pupils are equal, round, and reactive to light. Conjunctivae and EOM are normal.  Neck: Carotid bruit is not present.  Cardiovascular: Normal rate, regular rhythm, normal heart sounds and intact distal pulses.  Pulmonary/Chest: Effort normal and breath sounds normal.  Abdominal: Soft. She exhibits no pulsatile midline mass. There is no tenderness.  No abdominal striae.   Neurological: She is alert and oriented to person, place, and time.  Skin: Skin is warm and dry.  Psychiatric: She has a normal mood and affect. Her behavior is normal.  Vitals reviewed.  Vitals:   01/04/18 1657  BP: 111/72  Pulse: 66  Temp: 98.1 F (36.7 C)  TempSrc: Oral  SpO2: 94%  Weight: 200 lb (90.7 kg)  Height: 5\' 6"  (1.676 m)       Assessment & Plan:   Peggy Church is a 62 y.o. 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  -initial trial off chlorthalidone, continue amlodipine same dose for now (option of 10mg  if elevated readings). -potassium 2meq QD, check BMP and repeat at follow up in 5 days.  -consider aldoseterone/renin ratio if persistent, or endocine eval.   No orders of the defined types were placed in this encounter.  Patient Instructions     Stop chlorthalidone for now, return to 80meq potassium for now.  I will check levels today.  continue amlodipine same dose for now.  I will check potassium again today, but also repeat bloodwork in 5 days as lab only visit.  Recheck with me in 1 week to decide on next step and we can decide if other workup for possible secondary cause of low potassium.   If blood pressure over 140/90 - take 2 amlodipine.  That may increase ankle swelling, so if higher dose needed, we can look at other options next week.    Return to the clinic or go to the nearest emergency room if any of your symptoms worsen or new symptoms occur.   Hypokalemia Hypokalemia means that the amount of potassium in the blood is lower than normal.Potassium is a chemical that helps regulate the amount of fluid in the body (electrolyte). It also stimulates muscle tightening (contraction) and helps nerves work properly.Normally, most of the body's potassium is inside of cells, and only a very small  amount is in the blood. Because the amount in the blood is so small, minor changes to potassium levels in the blood can be life-threatening. What are the causes? This condition may be caused by:  Antibiotic medicine.  Diarrhea or vomiting. Taking too much of a medicine that helps you have a bowel movement (laxative) can cause diarrhea and lead to hypokalemia.  Chronic kidney disease (CKD).  Medicines that help the body get rid of excess fluid (diuretics).  Eating disorders, such as bulimia.  Low magnesium levels in the body.  Sweating a lot.  What are the signs or symptoms? Symptoms of this condition include:  Weakness.  Constipation.  Fatigue.  Muscle cramps.  Mental confusion.  Skipped heartbeats or irregular heartbeat (palpitations).  Tingling or numbness.  How is this diagnosed? This condition is diagnosed with a blood test. How is this treated? Hypokalemia can be treated by taking potassium supplements by mouth or adjusting the medicines that you take. Treatment may also include eating more foods that contain a lot of potassium. If your potassium level is very low, you may need to get potassium through an IV tube in one of your veins and be monitored in the hospital. Follow these instructions at home:  Take over-the-counter and prescription medicines only as told by your health care provider. This includes vitamins and supplements.  Eat a healthy diet. A healthy diet includes fresh fruits and vegetables,  whole grains, healthy fats, and lean proteins.  If instructed, eat more foods that contain a lot of potassium, such as: ? Nuts, such as peanuts and pistachios. ? Seeds, such as sunflower seeds and pumpkin seeds. ? Peas, lentils, and lima beans. ? Whole grain and bran cereals and breads. ? Fresh fruits and vegetables, such as apricots, avocado, bananas, cantaloupe, kiwi, oranges, tomatoes, asparagus, and potatoes. ? Orange juice. ? Tomato juice. ? Red meats. ? Yogurt.  Keep all follow-up visits as told by your health care provider. This is important. Contact a health care provider if:  You have weakness that gets worse.  You feel your heart pounding or racing.  You vomit.  You have diarrhea.  You have diabetes (diabetes mellitus) and you have trouble keeping your blood sugar (glucose) in your target range. Get help right away if:  You have chest pain.  You have shortness of breath.  You have vomiting or diarrhea that lasts for more than 2 days.  You faint. This information is not intended to replace advice given to you by your health care provider. Make sure you discuss any questions you have with your health care provider. Document Released: 04/25/2005 Document Revised: 12/12/2015 Document Reviewed: 12/12/2015 Elsevier Interactive Patient Education  [employer].   If you have lab work done today you will be contacted with your lab results within the next 2 weeks.  If you have not heard from [place] then please contact us. The fastest way to get your results is to register for My Chart.   IF you received an x-ray today, you will receive an invoice from [facility]. Please contact [facility] at [phone] with questions or concerns regarding your invoice.   IF you received labwork today, you will receive an invoice from [place] [facility]. Please contact [facility] at [phone] with questions or concerns regarding your invoice.   Our billing staff will not be  able to assist you with questions regarding bills from these companies.  You will be contacted with the lab results as soon as they are  available. The fastest way to get your results is to activate your My Chart account. Instructions are located on the last page of this paperwork. If you have not heard from [place] regarding the results in 2 weeks, please contact this office.       Signed,   Merri Ray, MD [facility].  01/07/18 7:50 PM

## 2018-01-05 LAB — BASIC METABOLIC PANEL
BUN/Creatinine Ratio: 26 (ref 12–28)
BUN: 22 mg/dL (ref 8–27)
CALCIUM: 10 mg/dL (ref 8.7–10.3)
CO2: 26 mmol/L (ref 20–29)
CREATININE: 0.84 mg/dL (ref 0.57–1.00)
Chloride: 96 mmol/L (ref 96–106)
GFR calc Af Amer: 87 mL/min/{1.73_m2} (ref >59)
GFR calc non Af Amer: 75 mL/min/{1.73_m2} (ref >59)
Glucose: 93 mg/dL (ref 65–99)
Potassium: 3.2 mmol/L — ABNORMAL LOW (ref 3.5–5.2)
SODIUM: 140 mmol/L (ref 134–144)

## 2018-01-07 ENCOUNTER — Encounter: Payer: Self-pay | Admitting: Family Medicine

## 2018-01-09 ENCOUNTER — Ambulatory Visit (INDEPENDENT_AMBULATORY_CARE_PROVIDER_SITE_OTHER): Payer: BC Managed Care – PPO | Admitting: Family Medicine

## 2018-01-09 DIAGNOSIS — E876 Hypokalemia: Secondary | ICD-10-CM

## 2018-01-09 NOTE — Progress Notes (Signed)
Nurse visit -  Lab visit only.

## 2018-01-10 LAB — BASIC METABOLIC PANEL
BUN/Creatinine Ratio: 28 (ref 12–28)
BUN: 21 mg/dL (ref 8–27)
CALCIUM: 9.5 mg/dL (ref 8.7–10.3)
CHLORIDE: 106 mmol/L (ref 96–106)
CO2: 25 mmol/L (ref 20–29)
CREATININE: 0.75 mg/dL (ref 0.57–1.00)
GFR calc Af Amer: 99 mL/min/{1.73_m2} (ref >59)
GFR calc non Af Amer: 86 mL/min/{1.73_m2} (ref >59)
GLUCOSE: 94 mg/dL (ref 65–99)
Potassium: 3.7 mmol/L (ref 3.5–5.2)
Sodium: 142 mmol/L (ref 134–144)

## 2018-01-12 ENCOUNTER — Other Ambulatory Visit: Payer: Self-pay | Admitting: Family Medicine

## 2018-01-12 MED FILL — POTASSIUM CHL ER M10 TABLET: 10 | 30 days supply | Qty: 60 | Fill #0

## 2018-01-12 NOTE — Telephone Encounter (Signed)
Potassium Chloride refill; pharm. Requesting update on dose Last Refill: 9/6//19; # 60; RF x 2 Last OV: 01/04/18 PCP: Dr. Carlota Raspberry Pharmacy:[facility].   Please review dose with provider; last OV note stated to start Potassium Chloride 40 mEq. qd. Last BMP; 01/09/18 with K+ 3.7

## 2018-01-15 ENCOUNTER — Other Ambulatory Visit: Payer: Self-pay | Admitting: Family Medicine

## 2018-01-15 NOTE — Telephone Encounter (Signed)
Please review for new prescription for potassium chloride. Per med list, pt taking 30 meq and per office visit 40 meq.

## 2018-01-15 NOTE — Telephone Encounter (Signed)
potassium chloride  10 MEQ refill Last Refill:01/12/18 # 60 Last OV: 01/04/18 PCP: [place]: [place]

## 2018-02-05 ENCOUNTER — Other Ambulatory Visit: Payer: Self-pay | Admitting: Family Medicine

## 2018-02-05 ENCOUNTER — Telehealth: Payer: Self-pay | Admitting: Family Medicine

## 2018-02-05 NOTE — Telephone Encounter (Signed)
Copied from [place] [phone]. Topic: Appointment Scheduling - Scheduling Inquiry for Clinic >> Feb 05, 2018 10:11 AM Oliver Pila B wrote: Reason for CRM: pt called to see pcp/ Carlota Raspberry pt is needing to do a bp f/u and/or medication; pt is asking if pcp can allow her to be seen on this Wednesday @ 5pm; contact pt to advise

## 2018-02-05 NOTE — Telephone Encounter (Signed)
Please schedule patient with Dr. Carlota Raspberry for next available apt can not fit her in on Wednesday unless she is having symptoms and then it would be a same day visit. Thanks

## 2018-02-28 ENCOUNTER — Ambulatory Visit: Payer: BC Managed Care – PPO | Admitting: Family Medicine

## 2018-03-01 NOTE — Telephone Encounter (Signed)
DONE

## 2018-03-19 MED FILL — POTASSIUM CL ER 20 MEQ TABL: 20 | 30 days supply | Qty: 60 | Fill #0

## 2018-03-26 ENCOUNTER — Other Ambulatory Visit: Payer: Self-pay | Admitting: Family Medicine

## 2018-03-26 DIAGNOSIS — I10 Essential (primary) hypertension: Secondary | ICD-10-CM

## 2018-03-27 MED FILL — AMLODIPINE BESYLATE 5 MG TA: 5 | 90 days supply | Qty: 90 | Fill #0

## 2018-03-28 ENCOUNTER — Other Ambulatory Visit: Payer: Self-pay

## 2018-03-28 ENCOUNTER — Encounter: Payer: Self-pay | Admitting: Family Medicine

## 2018-03-28 ENCOUNTER — Ambulatory Visit: Payer: BC Managed Care – PPO | Admitting: Family Medicine

## 2018-03-28 VITALS — BP 128/72 | HR 76 | Temp 98.5°F | Ht 66.0 in | Wt 201.0 lb

## 2018-03-28 DIAGNOSIS — E876 Hypokalemia: Secondary | ICD-10-CM | POA: Diagnosis not present

## 2018-03-28 DIAGNOSIS — B009 Herpesviral infection, unspecified: Secondary | ICD-10-CM

## 2018-03-28 DIAGNOSIS — I10 Essential (primary) hypertension: Secondary | ICD-10-CM | POA: Diagnosis not present

## 2018-03-28 MED ORDER — AMLODIPINE BESYLATE 5 MG PO TABS: 5.0000 mg | ORAL_TABLET | Freq: Every day | ORAL | 1 refills | Status: DC

## 2018-03-28 MED ORDER — ACYCLOVIR 400 MG PO TABS: 400.0000 mg | ORAL_TABLET | Freq: Every day | ORAL | 3 refills | Status: DC

## 2018-03-28 MED ORDER — ACYCLOVIR 400 MG PO TABS: 800.0000 mg | ORAL_TABLET | Freq: Two times a day (BID) | ORAL | 3 refills | Status: AC

## 2018-03-28 MED FILL — ACYCLOVIR 400 MG TABLET: 400 | 5 days supply | Qty: 20 | Fill #0

## 2018-03-28 NOTE — Patient Instructions (Addendum)
Blood pressure looks okay today.  Stay on same dose of medication.  Depending on the potassium levels we may be able to cut back on the supplements.  I refilled the acyclovir, take 2 pills twice per day for 5 days at symptom onset.  If you have more frequent symptoms, would consider daily dosing.  Recheck in 6 months, sooner if needed.  How to Take Your Blood Pressure You can take your blood pressure at home with a machine. You may need to check your blood pressure at home:  To check if you have high blood pressure (hypertension).  To check your blood pressure over time.  To make sure your blood pressure medicine is working.  Supplies needed: You will need a blood pressure machine, or monitor. You can buy one at a drugstore or online. When choosing one:  Choose one with an arm cuff.  Choose one that wraps around your upper arm. Only one finger should fit between your arm and the cuff.  Do not choose one that measures your blood pressure from your wrist or finger.  Your doctor can suggest a monitor. How to prepare Avoid these things for 30 minutes before checking your blood pressure:  Drinking caffeine.  Drinking alcohol.  Eating.  Smoking.  Exercising.  Five minutes before checking your blood pressure:  Pee.  Sit in a dining chair. Avoid sitting in a soft couch or armchair.  Be quiet. Do not talk.  How to take your blood pressure Follow the instructions that came with your machine. If you have a digital blood pressure monitor, these may be the instructions: 1. Sit up straight. 2. Place your feet on the floor. Do not cross your ankles or legs. 3. Rest your left arm at the level of your heart. You may rest it on a table, desk, or chair. 4. Pull up your shirt sleeve. 5. Wrap the blood pressure cuff around the upper part of your left arm. The cuff should be 1 inch (2.5 cm) above your elbow. It is best to wrap the cuff around bare skin. 6. Fit the cuff snugly around your  arm. You should be able to place only one finger between the cuff and your arm. 7. Put the cord inside the groove of your elbow. 8. Press the power button. 9. Sit quietly while the cuff fills with air and loses air. 10. Write down the numbers on the screen. 11. Wait 2-3 minutes and then repeat steps 1-10.  What do the numbers mean? Two numbers make up your blood pressure. The first number is called systolic pressure. The second is called diastolic pressure. An example of a blood pressure reading is "120 over 80" (or 120/80). If you are an adult and do not have a medical condition, use this guide to find out if your blood pressure is normal: Normal  First number: below 120.  Second number: below 80. Elevated  First number: 120-129.  Second number: below 80. Hypertension stage 1  First number: 130-139.  Second number: 80-89. Hypertension stage 2  First number: 140 or above.  Second number: 61 or above. Your blood pressure is above normal even if only the top or bottom number is above normal. Follow these instructions at home:  Check your blood pressure as often as your doctor tells you to.  Take your monitor to your next doctor's appointment. Your doctor will: ? Make sure you are using it correctly. ? Make sure it is working right.  Make sure  you understand what your blood pressure numbers should be.  Tell your doctor if your medicines are causing side effects. Contact a doctor if:  Your blood pressure keeps being high. Get help right away if:  Your first blood pressure number is higher than 180.  Your second blood pressure number is higher than 120. This information is not intended to replace advice given to you by your health care provider. Make sure you discuss any questions you have with your health care provider. Document Released: 04/07/2008 Document Revised: 03/23/2016 Document Reviewed: 10/02/2015 Elsevier Interactive Patient Education  [employer].    If you have lab work done today you will be contacted with your lab results within the next 2 weeks.  If you have not heard from [place] then please contact us. The fastest way to get your results is to register for My Chart.   IF you received an x-ray today, you will receive an invoice from [facility]. Please contact [facility] at [phone] with questions or concerns regarding your invoice.   IF you received labwork today, you will receive an invoice from [place]. Please contact [facility] at [phone] with questions or concerns regarding your invoice.   Our billing staff will not be able to assist you with questions regarding bills from these companies.  You will be contacted with the lab results as soon as they are available. The fastest way to get your results is to activate your My Chart account. Instructions are located on the last page of this paperwork. If you have not heard from [place] regarding the results in 2 weeks, please contact this office.

## 2018-03-28 NOTE — Progress Notes (Signed)
Subjective:  By signing my name below, I, [name], attest that this documentation has been prepared under the direction and in the presence of Merri Ray, MD. Electronically Signed: [name], [place]. 03/28/2018 , 3:02 PM .  Patient was seen in Room 9 .   Patient ID: Peggy Church, female    DOB: 07/21/1955, 62 y.o.   MRN: [id number] Chief Complaint  Patient presents with  . Hypertension    f/u    HPI Peggy Church is a 62 y.o. female Here for follow up. Last visit on Aug 29th.   HTN BP Readings from Last 3 Encounters:  03/28/18 128/72  01/04/18 111/72  12/15/17 124/72   Lab Results  Component Value Date   CREATININE 0.75 01/09/2018   Patient's potassium level was normal at 3.7 on Sept 3rd from level low of 3.2 on Aug 29th. She has been taking potassium supplement 40 meq qd. She was off of her potassium supplement for about a week, and restarted yesterday.   Lab Results  Component Value Date   K 3.7 01/09/2018   K 3.2 (L) 01/04/2018   At last visit on Aug 29th, her chlorthalidone was stopped and continued on amlodipine 5 mg qd. Since stopping her chlorthalidone, her ankle swelling has improved.   Home BP readings, using a cuff at home, running 130s up to 150s / 90s.   Wt Readings from Last 3 Encounters:  03/28/18 201 lb (91.2 kg)  01/04/18 200 lb (90.7 kg)  12/15/17 198 lb 12.8 oz (90.2 kg)    History of herpes outbreak Patient requests acyclovir for possible genital herpes outbreak. Her last true flare occurred a few months ago. In the past year, she's had 1 true flare. She mentions taking Valtrex in the past without any relief.   Patient Active Problem List   Diagnosis Date Noted  . BMI 31.0-31.9,adult 11/05/2015  . HTN (hypertension) 06/26/2012   Past Medical History:  Diagnosis Date  . Allergy   . Hypertension   . Migraine    Past Surgical History:  Procedure Laterality Date  . [place]   stretched  . ECTOPIC PREGNANCY  SURGERY    . TONSILLECTOMY AND ADENOIDECTOMY    . TUBAL LIGATION     Allergies  Allergen Reactions  . Benicar [Olmesartan]     Smells and senses things that aren't really there.  . Lisinopril     cough  . Sulfa Drugs Cross Reactors    Prior to Admission medications   Medication Sig Start Date End Date Taking? Authorizing Provider  amLODipine (NORVASC) 5 MG tablet TAKE 1 TABLET BY MOUTH DAILY. 03/27/18   [name], MD  chlorthalidone (HYGROTON) 50 MG tablet Take 2 tablets (100 mg total) by mouth daily. 09/01/17   [name], MD  Multiple Vitamin (MULTI-VITAMINS) TABS Take by mouth daily.    [provider]  potassium chloride SA (K-DUR,KLOR-CON) 20 MEQ tablet TAKE 1 TABLET BY MOUTH TWICE DAILY 02/05/18   [name], MD   Social History   Socioeconomic History  . Marital status: Divorced    Spouse name: n/a  . Number of children: 3  . Years of education: college  . Highest education level: Not on file  Occupational History  . Occupation: Consulting civil engineer    Comment: [employer]  . Financial resource strain: Not on file  . Food insecurity:    Worry: Not on file    Inability: Not on  file  . Transportation needs:    Medical: Not on file    Non-medical: Not on file  Tobacco Use  . Smoking status: Former Smoker    Last attempt to quit: 07/18/1991    Years since quitting: 26.7  . Smokeless tobacco: Never Used  Substance and Sexual Activity  . Alcohol use: Yes    Alcohol/week: 0.0 standard drinks    Comment: rarely  . Drug use: No  . Sexual activity: Not on file  Lifestyle  . Physical activity:    Days per week: Not on file    Minutes per session: Not on file  . Stress: Not on file  Relationships  . Social connections:    Talks on phone: Not on file    Gets together: Not on file    Attends religious service: Not on file    Active member of club or organization: Not on file    Attends meetings of clubs or organizations:  Not on file    Relationship status: Not on file  . Intimate partner violence:    Fear of current or ex partner: Not on file    Emotionally abused: Not on file    Physically abused: Not on file    Forced sexual activity: Not on file  Other Topics Concern  . Not on file  Social History Narrative   Lives with her daughter and fiance.   Review of Systems  Constitutional: Negative for fatigue and unexpected weight change.  Respiratory: Negative for chest tightness and shortness of breath.   Cardiovascular: Negative for chest pain, palpitations and leg swelling.  Gastrointestinal: Negative for abdominal pain and blood in stool.  Neurological: Negative for dizziness, syncope, light-headedness and headaches.       Objective:   Physical Exam  Constitutional: She is oriented to person, place, and time. She appears well-developed and well-nourished.  HENT:  Head: Normocephalic and atraumatic.  Eyes: Pupils are equal, round, and reactive to light. Conjunctivae and EOM are normal.  Neck: Carotid bruit is not present.  Cardiovascular: Normal rate, regular rhythm, normal heart sounds and intact distal pulses.  Pulmonary/Chest: Effort normal and breath sounds normal.  Abdominal: Soft. She exhibits no pulsatile midline mass. There is no tenderness.  Musculoskeletal: She exhibits edema (trace pedal edema).  Neurological: She is alert and oriented to person, place, and time.  Skin: Skin is warm and dry.  Psychiatric: She has a normal mood and affect. Her behavior is normal.  Vitals reviewed.   Vitals:   03/28/18 1409  BP: 128/72  Pulse: 76  Temp: 98.5 F (36.9 C)  TempSrc: Oral  SpO2: 96%  Weight: 201 lb (91.2 kg)  Height: 5\' 6"  (1.676 m)       Assessment & Plan:   Peggy Church is a 62 y.o. female Hypokalemia - Plan: Basic metabolic panel Essential hypertension - Plan: amLODipine (NORVASC) 5 MG tablet  - BP stable.  Check labs, continue same regimen but may be able to decrease  potassium supplementation.   HSV infection - Plan: acyclovir (ZOVIRAX) 400 MG tablet, DISCONTINUED: acyclovir (ZOVIRAX) 400 MG tablet  - acyclovir refilled for prn use, option of daily use if frequent flair.   Meds ordered this encounter  Medications  . amLODipine (NORVASC) 5 MG tablet    Sig: Take 1 tablet (5 mg total) by mouth daily.    Dispense:  90 tablet    Refill:  1  . DISCONTD: acyclovir (ZOVIRAX) 400 MG tablet    Sig:  Take 1 tablet (400 mg total) by mouth 5 (five) times daily.    Dispense:  20 tablet    Refill:  3  . acyclovir (ZOVIRAX) 400 MG tablet    Sig: Take 2 tablets (800 mg total) by mouth 2 (two) times daily for 5 days. At onset of symptoms.    Dispense:  20 tablet    Refill:  3   Patient Instructions   Blood pressure looks okay today.  Stay on same dose of medication.  Depending on the potassium levels we may be able to cut back on the supplements.  I refilled the acyclovir, take 2 pills twice per day for 5 days at symptom onset.  If you have more frequent symptoms, would consider daily dosing.  Recheck in 6 months, sooner if needed.  How to Take Your Blood Pressure You can take your blood pressure at home with a machine. You may need to check your blood pressure at home:  To check if you have high blood pressure (hypertension).  To check your blood pressure over time.  To make sure your blood pressure medicine is working.  Supplies needed: You will need a blood pressure machine, or monitor. You can buy one at a drugstore or online. When choosing one:  Choose one with an arm cuff.  Choose one that wraps around your upper arm. Only one finger should fit between your arm and the cuff.  Do not choose one that measures your blood pressure from your wrist or finger.  Your doctor can suggest a monitor. How to prepare Avoid these things for 30 minutes before checking your blood pressure:  Drinking caffeine.  Drinking  alcohol.  Eating.  Smoking.  Exercising.  Five minutes before checking your blood pressure:  Pee.  Sit in a dining chair. Avoid sitting in a soft couch or armchair.  Be quiet. Do not talk.  How to take your blood pressure Follow the instructions that came with your machine. If you have a digital blood pressure monitor, these may be the instructions: 1. Sit up straight. 2. Place your feet on the floor. Do not cross your ankles or legs. 3. Rest your left arm at the level of your heart. You may rest it on a table, desk, or chair. 4. Pull up your shirt sleeve. 5. Wrap the blood pressure cuff around the upper part of your left arm. The cuff should be 1 inch (2.5 cm) above your elbow. It is best to wrap the cuff around bare skin. 6. Fit the cuff snugly around your arm. You should be able to place only one finger between the cuff and your arm. 7. Put the cord inside the groove of your elbow. 8. Press the power button. 9. Sit quietly while the cuff fills with air and loses air. 10. Write down the numbers on the screen. 11. Wait 2-3 minutes and then repeat steps 1-10.  What do the numbers mean? Two numbers make up your blood pressure. The first number is called systolic pressure. The second is called diastolic pressure. An example of a blood pressure reading is "120 over 80" (or 120/80). If you are an adult and do not have a medical condition, use this guide to find out if your blood pressure is normal: Normal  First number: below 120.  Second number: below 80. Elevated  First number: 120-129.  Second number: below 80. Hypertension stage 1  First number: 130-139.  Second number: 80-89. Hypertension stage 2  First number:  140 or above.  Second number: 91 or above. Your blood pressure is above normal even if only the top or bottom number is above normal. Follow these instructions at home:  Check your blood pressure as often as your doctor tells you to.  Take your  monitor to your next doctor's appointment. Your doctor will: ? Make sure you are using it correctly. ? Make sure it is working right.  Make sure you understand what your blood pressure numbers should be.  Tell your doctor if your medicines are causing side effects. Contact a doctor if:  Your blood pressure keeps being high. Get help right away if:  Your first blood pressure number is higher than 180.  Your second blood pressure number is higher than 120. This information is not intended to replace advice given to you by your health care provider. Make sure you discuss any questions you have with your health care provider. Document Released: 04/07/2008 Document Revised: 03/23/2016 Document Reviewed: 10/02/2015 Elsevier Interactive Patient Education  [employer].    If you have lab work done today you will be contacted with your lab results within the next 2 weeks.  If you have not heard from [place] then please contact us. The fastest way to get your results is to register for My Chart.   IF you received an x-ray today, you will receive an invoice from [facility]. Please contact [facility] at [phone] with questions or concerns regarding your invoice.   IF you received labwork today, you will receive an invoice from [place]. Please contact [facility] at [phone] with questions or concerns regarding your invoice.   Our billing staff will not be able to assist you with questions regarding bills from these companies.  You will be contacted with the lab results as soon as they are available. The fastest way to get your results is to activate your My Chart account. Instructions are located on the last page of this paperwork. If you have not heard from [place] regarding the results in 2 weeks, please contact this office.       I personally performed the services described in this documentation, which was scribed in my presence. The recorded information has been  reviewed and considered for accuracy and completeness, addended by me as needed, and agree with information above.  Signed,   Merri Ray, MD [facility] [place].  04/01/18 10:15 PM

## 2018-03-29 LAB — BASIC METABOLIC PANEL
BUN/Creatinine Ratio: 14 (ref 12–28)
BUN: 14 mg/dL (ref 8–27)
CALCIUM: 9.6 mg/dL (ref 8.7–10.3)
CHLORIDE: 103 mmol/L (ref 96–106)
CO2: 22 mmol/L (ref 20–29)
Creatinine, Ser: 1.03 mg/dL — ABNORMAL HIGH (ref 0.57–1.00)
GFR calc Af Amer: 68 mL/min/{1.73_m2} (ref >59)
GFR calc non Af Amer: 59 mL/min/{1.73_m2} — ABNORMAL LOW (ref >59)
Glucose: 93 mg/dL (ref 65–99)
Potassium: 4.1 mmol/L (ref 3.5–5.2)
Sodium: 142 mmol/L (ref 134–144)

## 2018-04-19 MED FILL — ACYCLOVIR 400 MG TABLET: 400 | 5 days supply | Qty: 20 | Fill #1

## 2018-06-27 ENCOUNTER — Other Ambulatory Visit: Payer: Self-pay | Admitting: Family Medicine

## 2018-06-27 MED FILL — AMLODIPINE BESYLATE 5 MG TA: 5 | 90 days supply | Qty: 90 | Fill #1

## 2018-06-27 MED FILL — ACYCLOVIR 400 MG TABLET: 400 | 5 days supply | Qty: 20 | Fill #2

## 2018-06-27 MED FILL — POTASSIUM CHLORIDE CRYS ER: 20 | 90 days supply | Qty: 180 | Fill #0

## 2018-06-27 NOTE — Telephone Encounter (Signed)
Requested Prescriptions  Pending Prescriptions Disp Refills  . potassium chloride SA (K-DUR,KLOR-CON) 20 MEQ tablet [Pharmacy Med Name: POTASSIUM CHLORIDE CRYS ER 20 TAB] 180 tablet 0    Sig: TAKE 1 TABLET BY MOUTH TWICE DAILY     Endocrinology:  Minerals - Potassium Supplementation Failed - 06/27/2018  1:32 PM      Failed - Cr in normal range and within 360 days    Creat  Date Value Ref Range Status  11/05/2015 0.84 0.50 - 1.05 mg/dL Final    Comment:      For patients > or = 62 years of age: The upper reference limit for Creatinine is approximately 13% higher for people identified as African-American.      Creatinine, Ser  Date Value Ref Range Status  03/28/2018 1.03 (H) 0.57 - 1.00 mg/dL Final         Passed - K in normal range and within 360 days    Potassium  Date Value Ref Range Status  03/28/2018 4.1 3.5 - 5.2 mmol/L Final         Passed - Valid encounter within last 12 months    Recent Outpatient Visits          3 months ago Hypokalemia   [facility], Ranell Patrick, MD   5 months ago Hypokalemia   [facility], Ranell Patrick, MD   5 months ago Essential hypertension   [facility], Ranell Patrick, MD   6 months ago Annual physical exam   [facility] [place], MD   7 months ago Hypokalemia   [facility], Ranell Patrick, MD

## 2018-07-24 ENCOUNTER — Ambulatory Visit: Payer: BC Managed Care – PPO | Admitting: Family Medicine

## 2018-07-25 ENCOUNTER — Encounter: Payer: Self-pay | Admitting: Family Medicine

## 2018-08-25 MED FILL — ACYCLOVIR 400 MG TABLET: 400 | 5 days supply | Qty: 20 | Fill #3

## 2018-09-29 MED FILL — AMLODIPINE BESYLATE 5 MG TA: 5 | 90 days supply | Qty: 90 | Fill #0

## 2018-12-05 ENCOUNTER — Other Ambulatory Visit: Payer: Self-pay | Admitting: Family Medicine

## 2018-12-05 DIAGNOSIS — B009 Herpesviral infection, unspecified: Secondary | ICD-10-CM

## 2018-12-05 NOTE — Telephone Encounter (Signed)
Medication is not on patient's active med list.   Requested Prescriptions  Pending Prescriptions Disp Refills   acyclovir (ZOVIRAX) 400 MG tablet [Pharmacy Med Name: ACYCLOVIR 400 MG TABLET 400 TAB] 20 tablet 3    Sig: TAKE 2 TABLETS BY MOUTH TWO TIMES DAILY FOR 5 DAYS AT ONSET OF SYMPTOMS     Antimicrobials:  Antiviral Agents - Anti-Herpetic Passed - 12/05/2018 11:21 AM      Passed - Valid encounter within last 12 months    Recent Outpatient Visits          8 months ago Hypokalemia   [facility], Ranell Patrick, MD   11 months ago Hypokalemia   [facility], Ranell Patrick, MD   11 months ago Essential hypertension   [facility], Ranell Patrick, MD   11 months ago Annual physical exam   [facility], Ranell Patrick, MD   1 year ago Hypokalemia   [facility], Ranell Patrick, MD

## 2019-01-01 MED FILL — AMLODIPINE BESYLATE 5 MG TA: 5 | 90 days supply | Qty: 90 | Fill #1

## 2019-01-18 ENCOUNTER — Other Ambulatory Visit: Payer: Self-pay

## 2019-01-18 ENCOUNTER — Ambulatory Visit: Payer: BC Managed Care – PPO | Admitting: Family Medicine

## 2019-01-18 ENCOUNTER — Encounter: Payer: Self-pay | Admitting: Family Medicine

## 2019-01-18 VITALS — BP 139/84 | HR 82 | Temp 98.5°F | Ht 66.0 in | Wt 205.4 lb

## 2019-01-18 DIAGNOSIS — K089 Disorder of teeth and supporting structures, unspecified: Secondary | ICD-10-CM

## 2019-01-18 DIAGNOSIS — G8929 Other chronic pain: Secondary | ICD-10-CM

## 2019-01-18 DIAGNOSIS — H02844 Edema of left upper eyelid: Secondary | ICD-10-CM

## 2019-01-18 DIAGNOSIS — G500 Trigeminal neuralgia: Secondary | ICD-10-CM

## 2019-01-18 NOTE — Progress Notes (Signed)
Patient ID: Peggy Church, female    DOB: 02-02-1956  Age: 63 y.o. MRN: [id number]  Chief Complaint  Patient presents with  . puffy eyelids    past 2 days   . left earache    Subjective:   Patient has been having some problems in the left side of her face.  Actually she has been having some problems for considerable time.  She has had a lot of work done by the dentist.  She has had a place worked on that is ready to get an implant but did not get it done because of COVID.  She that is in the left lower.  She has just gotten new glasses a couple of days ago she noticed a left eyebrow and upper eyelid area were puffy.  No real symptoms of it.  She does have history of some allergies.  She took an antihistamine yesterday and thinks a little better today.  She has had some discomfort in her left ear, above the ear lobe.  She otherwise been pretty healthy.  She works as a Optometrist.  Current allergies, medications, problem list, past/family and social histories reviewed.  Objective:  BP 139/84 (BP Location: Right Arm, Patient Position: Sitting, Cuff Size: Normal)   Pulse 82   Temp 98.5 F (36.9 C) (Oral)   Ht 5\' 6"  (1.676 m)   Wt 205 lb 6.4 oz (93.2 kg)   SpO2 98%   BMI 33.15 kg/m   No major acute distress.  Eyes and orbits and eyelids look normal to me.  No tenderness of the maxillary or frontal sinuses.  Has a little tenderness along the dental line up and down.  Throat is clear otherwise.  No erythema noted.  Neck supple without nodes.  Assessment & Plan:   Assessment: 1. Facial pain syndrome   2. Chronic dental pain   3. Swelling of left upper eyelid       Plan: Nonspecific symptoms.  Probably needs to follow-up with her dentist if needed.  See instructions.  No orders of the defined types were placed in this encounter.   No orders of the defined types were placed in this encounter.        Patient Instructions    Continue taking your taking your  antihistamine.  Take naproxen sodium (Aleve) 220 mg 2 pills twice daily with food on a continuous basis for a week or 10 days and see if that knocks out the inflammation sufficiently to help give you relief.  Keep your appointment with your dentist, and consider getting it moved up if needed.  If you develop fever, redness, and swelling of the gums or face then I will probably put you on an antibiotic while you are waiting to see your dentist.  Return as needed    If you have lab work done today you will be contacted with your lab results within the next 2 weeks.  If you have not heard from [place] then please contact us. The fastest way to get your results is to register for My Chart.   IF you received an x-ray today, you will receive an invoice from [facility]. Please contact [facility] at [phone] with questions or concerns regarding your invoice.   IF you received labwork today, you will receive an invoice from [place]. Please contact [facility] at [phone] with questions or concerns regarding your invoice.   Our billing staff will not be able to assist you with questions regarding bills from these  companies.  You will be contacted with the lab results as soon as they are available. The fastest way to get your results is to activate your My Chart account. Instructions are located on the last page of this paperwork. If you have not heard from [place] regarding the results in 2 weeks, please contact this office.         Return if symptoms worsen or fail to improve.   Ruben Reason, MD 01/18/2019

## 2019-01-18 NOTE — Patient Instructions (Addendum)
  Continue taking your taking your antihistamine.  Take naproxen sodium (Aleve) 220 mg 2 pills twice daily with food on a continuous basis for a week or 10 days and see if that knocks out the inflammation sufficiently to help give you relief.  Keep your appointment with your dentist, and consider getting it moved up if needed.  If you develop fever, redness, and swelling of the gums or face then I will probably put you on an antibiotic while you are waiting to see your dentist.  Return as needed    If you have lab work done today you will be contacted with your lab results within the next 2 weeks.  If you have not heard from [place] then please contact us. The fastest way to get your results is to register for My Chart.   IF you received an x-ray today, you will receive an invoice from [facility]. Please contact [facility] at [phone] with questions or concerns regarding your invoice.   IF you received labwork today, you will receive an invoice from [place]. Please contact [facility] at [phone] with questions or concerns regarding your invoice.   Our billing staff will not be able to assist you with questions regarding bills from these companies.  You will be contacted with the lab results as soon as they are available. The fastest way to get your results is to activate your My Chart account. Instructions are located on the last page of this paperwork. If you have not heard from [place] regarding the results in 2 weeks, please contact this office.

## 2019-03-18 MED FILL — CLINDAMYCIN HCL 150 MG CAPS: 150 | 7 days supply | Qty: 30 | Fill #0

## 2019-03-28 ENCOUNTER — Other Ambulatory Visit: Payer: Self-pay | Admitting: Family Medicine

## 2019-03-28 DIAGNOSIS — I10 Essential (primary) hypertension: Secondary | ICD-10-CM

## 2019-03-28 NOTE — Telephone Encounter (Signed)
Requested medication (s) are due for refill today: yes  Requested medication (s) are on the active medication list: yes  Last refill:  01/01/2019  Future visit scheduled:no  Notes to clinic:  Overdue for office visit     Requested Prescriptions  Pending Prescriptions Disp Refills   amLODipine (NORVASC) 5 MG tablet [Pharmacy Med Name: AMLODIPINE BESYLATE 5 MG TA 5 Tablet] 90 tablet 1    Sig: TAKE 1 TABLET BY MOUTH ONCE A DAY     Cardiovascular:  Calcium Channel Blockers Failed - 03/28/2019 11:20 AM      Failed - Valid encounter within last 6 months    Recent Outpatient Visits          2 months ago Facial pain syndrome   [facility] Fenton Malling, MD   1 year ago Hypokalemia   [facility], Ranell Patrick, MD   1 year ago Hypokalemia   [facility], Ranell Patrick, MD   1 year ago Essential hypertension   [facility], Ranell Patrick, MD   1 year ago Annual physical exam   [facility] [place], MD             Passed - Last BP in normal range    BP Readings from Last 1 Encounters:  01/18/19 139/84

## 2019-03-29 ENCOUNTER — Telehealth: Payer: Self-pay | Admitting: *Deleted

## 2019-03-29 NOTE — Telephone Encounter (Signed)
No prescription sent.  Patient needs appointment

## 2019-04-03 ENCOUNTER — Other Ambulatory Visit: Payer: Self-pay

## 2019-04-03 ENCOUNTER — Ambulatory Visit: Payer: BC Managed Care – PPO | Admitting: Family Medicine

## 2019-04-03 ENCOUNTER — Encounter: Payer: Self-pay | Admitting: Family Medicine

## 2019-04-03 VITALS — BP 129/80 | HR 86 | Temp 98.8°F | Wt 202.2 lb

## 2019-04-03 DIAGNOSIS — B009 Herpesviral infection, unspecified: Secondary | ICD-10-CM | POA: Diagnosis not present

## 2019-04-03 DIAGNOSIS — I10 Essential (primary) hypertension: Secondary | ICD-10-CM

## 2019-04-03 DIAGNOSIS — E785 Hyperlipidemia, unspecified: Secondary | ICD-10-CM | POA: Diagnosis not present

## 2019-04-03 DIAGNOSIS — E876 Hypokalemia: Secondary | ICD-10-CM | POA: Diagnosis not present

## 2019-04-03 MED ORDER — ACYCLOVIR 200 MG PO CAPS: 800.0000 mg | ORAL_CAPSULE | Freq: Two times a day (BID) | ORAL | 0 refills | Status: DC

## 2019-04-03 MED ORDER — AMLODIPINE BESYLATE 5 MG PO TABS: 5.0000 mg | ORAL_TABLET | Freq: Every day | ORAL | 1 refills | Status: DC

## 2019-04-03 MED FILL — AMLODIPINE BESYLATE 5 MG TA: 5 | 90 days supply | Qty: 90 | Fill #0

## 2019-04-03 MED FILL — ACYCLOVIR 200 MG CAP: 200 | 7 days supply | Qty: 60 | Fill #0

## 2019-04-03 NOTE — Progress Notes (Signed)
Subjective:  Patient ID: Peggy Church, female    DOB: October 27, 1955  Age: 63 y.o. MRN: [id number]  CC:  Chief Complaint  Patient presents with  . Medical Management of Chronic Issues    f/u and refill on meds    HPI Peggy Church presents for   Hypertension: Amlodipine 5 mg daily.  Previously on chlorthalidone but ankle swelling improved once that medicine stopped. Previous hypokalemia, had taken potassium supplement 40 mEq daily previously.  Last discussed in November 2019. Only taking potassium supplement intermittently due to size of pill and upset stomach. About every 10 days.  Has tried to incorporate more potassium rich foods.  Home readings: 120/80's.  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 1 episode per year at that time.  Treated with acyclovir Last flare few months ago. 3 total in past year.  Taking acyclovir for flare only - 873m BID for 5 days.   Hyperlipidemia: No current meds. Difficulty tolerating meds prior. Niacin - flushing, lipitor - myalgias, pain with any workouts or activity. Multiple other meds. Tried high dose statin in past   Vegetarian for past 9 years. Has not met with lipid specialist recently.  Lab Results  Component Value Date   CHOL 302 (H) 09/01/2017   HDL 46 09/01/2017   LDLCALC 215 (H) 09/01/2017   LDLDIRECT 209.1 08/31/2011   TRIG 205 (H) 09/01/2017   CHOLHDL 6.6 (H) 09/01/2017   Lab Results  Component Value Date   ALT 28 10/06/2017   AST 27 10/06/2017   ALKPHOS 76 10/06/2017   BILITOT 0.8 10/06/2017      History Patient Active Problem List   Diagnosis Date Noted  . BMI 31.0-31.9,adult 11/05/2015  . HTN (hypertension) 06/26/2012   Past Medical History:  Diagnosis  Date  . Allergy   . Hypertension   . Migraine    Past Surgical History:  Procedure Laterality Date  . [place]  stretched  . ECTOPIC PREGNANCY SURGERY    . TONSILLECTOMY AND ADENOIDECTOMY    . TUBAL LIGATION     Allergies  Allergen Reactions  . Benicar [Olmesartan]     Smells and senses things that aren't really there.  . Lisinopril     cough  . Sulfa Drugs Cross Reactors    Prior to Admission medications   Medication Sig Start Date End Date Taking? Authorizing Provider  amLODipine (NORVASC) 5 MG tablet Take 1 tablet (5 mg total) by mouth daily. 03/28/18  Yes GWendie Agreste MD  Multiple Vitamin (MULTI-VITAMINS) TABS Take by mouth daily.   Yes [provider]  potassium chloride SA (K-DUR,KLOR-CON) 20 MEQ tablet TAKE 1 TABLET BY MOUTH TWICE DAILY 06/27/18  Yes GWendie Agreste MD   Social History   Socioeconomic History  . Marital status: Divorced    Spouse name: n/a  . Number of children: 3  . Years of education: college  . Highest education level: Not on file  Occupational History  . Occupation: tConsulting civil engineer   Comment: [employer] . Financial resource strain: Not on file  . Food insecurity    Worry: Not on file    Inability: Not  on file  . Transportation needs    Medical: Not on file    Non-medical: Not on file  Tobacco Use  . Smoking status: Former Smoker    Quit date: 07/18/1991    Years since quitting: 27.7  . Smokeless tobacco: Never Used  Substance and Sexual Activity  . Alcohol use: Yes    Alcohol/week: 0.0 standard drinks    Comment: rarely  . Drug use: No  . Sexual activity: Not on file  Lifestyle  . Physical activity    Days per week: Not on file    Minutes per session: Not on file  . Stress: Not on file  Relationships  . Social Herbalist on phone: Not on file    Gets together: Not on file    Attends religious service: Not on file    Active member of club or organization: Not  on file    Attends meetings of clubs or organizations: Not on file    Relationship status: Not on file  . Intimate partner violence    Fear of current or ex partner: Not on file    Emotionally abused: Not on file    Physically abused: Not on file    Forced sexual activity: Not on file  Other Topics Concern  . Not on file  Social History Narrative   Lives with her daughter and fiance.    Review of Systems  Constitutional: Negative for fatigue and unexpected weight change.  Respiratory: Negative for chest tightness and shortness of breath.   Cardiovascular: Negative for chest pain, palpitations and leg swelling.  Gastrointestinal: Negative for abdominal pain and blood in stool.  Neurological: Negative for dizziness, syncope, light-headedness and headaches.     Objective:   Vitals:   04/03/19 1004  BP: 129/80  Pulse: 86  Temp: 98.8 F (37.1 C)  TempSrc: Oral  SpO2: 99%  Weight: 202 lb 3.2 oz (91.7 kg)     Physical Exam Vitals signs reviewed.  Constitutional:      Appearance: She is well-developed.  HENT:     Head: Normocephalic and atraumatic.  Eyes:     Conjunctiva/sclera: Conjunctivae normal.     Pupils: Pupils are equal, round, and reactive to light.  Neck:     Vascular: No carotid bruit.  Cardiovascular:     Rate and Rhythm: Normal rate and regular rhythm.     Heart sounds: Normal heart sounds.  Pulmonary:     Effort: Pulmonary effort is normal.     Breath sounds: Normal breath sounds.  Abdominal:     Palpations: Abdomen is soft. There is no pulsatile mass.     Tenderness: There is no abdominal tenderness.  Skin:    General: Skin is warm and dry.  Neurological:     Mental Status: She is alert and oriented to person, place, and time.  Psychiatric:        Behavior: Behavior normal.        Assessment & Plan:  Peggy Church is a 63 y.o. female . Essential hypertension - Plan: amLODipine (NORVASC) 5 MG tablet, Comprehensive metabolic panel   -  Stable, tolerating current regimen. Medications refilled. Labs pending as above.   Hypokalemia - Plan: Comprehensive metabolic panel  -Continue potassium rich foods.  Check levels.  May need increased dosing of potassium supplement  HSV infection - Plan: acyclovir (ZOVIRAX) 200 MG capsule  -Some increased episodes with pandemic/stress.  Acyclovir refilled for 800 milligrams twice daily for 5 days  per flair.   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  Repeat levels.  Referred to cardiology/lipid clinic to discuss other treatment options.  Not sure if PCSK9 inhibitor may be option.   No orders of the defined types were placed in this encounter.  Patient Instructions    No change in meds for now.  Continue potassium rich foods in the diet but I will check those levels today.  I will refer you to cardiology/lipid specialist after the first of the year but we will recheck those levels as well today.  Thank you for coming in today and stay safe.    If you have lab work done today you will be contacted with your lab results within the next 2 weeks.  If you have not heard from [place] then please contact us. The fastest way to get your results is to register for My Chart.   IF you received an x-ray today, you will receive an invoice from [facility]. Please contact [facility] at [phone] with questions or concerns regarding your invoice.   IF you received labwork today, you will receive an invoice from [place]. Please contact [facility] at [phone] with questions or concerns regarding your invoice.   Our billing staff will not be able to assist you with questions regarding bills from these companies.  You will be contacted with the lab results as soon as they are available. The fastest way to get your results is to activate your My Chart account.  Instructions are located on the last page of this paperwork. If you have not heard from [place] regarding the results in 2 weeks, please contact this office.          Signed, Merri Ray, MD [facility]

## 2019-04-03 NOTE — Patient Instructions (Addendum)
  No change in meds for now.  Continue potassium rich foods in the diet but I will check those levels today.  I will refer you to cardiology/lipid specialist after the first of the year but we will recheck those levels as well today.  Thank you for coming in today and stay safe.    If you have lab work done today you will be contacted with your lab results within the next 2 weeks.  If you have not heard from [place] then please contact us. The fastest way to get your results is to register for My Chart.   IF you received an x-ray today, you will receive an invoice from [facility]. Please contact [facility] at [phone] with questions or concerns regarding your invoice.   IF you received labwork today, you will receive an invoice from [place]. Please contact [facility] at [phone] with questions or concerns regarding your invoice.   Our billing staff will not be able to assist you with questions regarding bills from these companies.  You will be contacted with the lab results as soon as they are available. The fastest way to get your results is to activate your My Chart account. Instructions are located on the last page of this paperwork. If you have not heard from [place] regarding the results in 2 weeks, please contact this office.

## 2019-04-04 LAB — COMPREHENSIVE METABOLIC PANEL
ALT: 19 IU/L (ref 0–32)
AST: 20 IU/L (ref 0–40)
Albumin/Globulin Ratio: 2.1 (ref 1.2–2.2)
Albumin: 4.4 g/dL (ref 3.8–4.8)
Alkaline Phosphatase: 109 IU/L (ref 39–117)
BUN/Creatinine Ratio: 14 (ref 12–28)
BUN: 13 mg/dL (ref 8–27)
Bilirubin Total: 0.5 mg/dL (ref 0.0–1.2)
CO2: 19 mmol/L — ABNORMAL LOW (ref 20–29)
Calcium: 9.5 mg/dL (ref 8.7–10.3)
Chloride: 105 mmol/L (ref 96–106)
Creatinine, Ser: 0.96 mg/dL (ref 0.57–1.00)
GFR calc Af Amer: 73 mL/min/{1.73_m2} (ref >59)
GFR calc non Af Amer: 64 mL/min/{1.73_m2} (ref >59)
Globulin, Total: 2.1 g/dL (ref 1.5–4.5)
Glucose: 102 mg/dL — ABNORMAL HIGH (ref 65–99)
Potassium: 4 mmol/L (ref 3.5–5.2)
Sodium: 141 mmol/L (ref 134–144)
Total Protein: 6.5 g/dL (ref 6.0–8.5)

## 2019-04-04 LAB — LIPID PANEL
Chol/HDL Ratio: 8.1 ratio — ABNORMAL HIGH (ref 0.0–4.4)
Cholesterol, Total: 372 mg/dL — ABNORMAL HIGH (ref 100–199)
HDL: 46 mg/dL (ref >39)
LDL Chol Calc (NIH): 279 mg/dL — ABNORMAL HIGH (ref 0–99)
Triglycerides: 220 mg/dL — ABNORMAL HIGH (ref 0–149)
VLDL Cholesterol Cal: 47 mg/dL — ABNORMAL HIGH (ref 5–40)

## 2019-05-13 ENCOUNTER — Encounter: Payer: Self-pay | Admitting: Family Medicine

## 2019-06-10 ENCOUNTER — Other Ambulatory Visit: Payer: Self-pay

## 2019-06-10 ENCOUNTER — Ambulatory Visit (INDEPENDENT_AMBULATORY_CARE_PROVIDER_SITE_OTHER): Payer: BC Managed Care – PPO | Admitting: Family Medicine

## 2019-06-10 ENCOUNTER — Encounter: Payer: Self-pay | Admitting: Family Medicine

## 2019-06-10 VITALS — BP 126/86 | HR 82 | Temp 98.0°F | Ht 66.0 in | Wt 205.0 lb

## 2019-06-10 DIAGNOSIS — R519 Headache, unspecified: Secondary | ICD-10-CM

## 2019-06-10 DIAGNOSIS — R21 Rash and other nonspecific skin eruption: Secondary | ICD-10-CM | POA: Diagnosis not present

## 2019-06-10 MED ORDER — ACYCLOVIR 400 MG PO TABS: 800.0000 mg | ORAL_TABLET | Freq: Every day | ORAL | 0 refills | Status: DC

## 2019-06-10 MED ORDER — DOXYCYCLINE HYCLATE 100 MG PO TABS: 100.0000 mg | ORAL_TABLET | Freq: Two times a day (BID) | ORAL | 0 refills | Status: DC

## 2019-06-10 MED FILL — ACYCLOVIR 400 MG TABLET: 400 | 7 days supply | Qty: 70 | Fill #0

## 2019-06-10 MED FILL — DOXYCYCLINE HYCLATE 100 MG: 100 | 10 days supply | Qty: 20 | Fill #0

## 2019-06-10 NOTE — Patient Instructions (Addendum)
   Continue acyclovir but take 2 pills 5 times per day to treat for possible shingles although that is less likely.  Start doxycycline for possible facial infection (can also help sinusitis) warm compresses few times per day, recheck in 2 days.  If any return of blurry vision be seen by her ophthalmologist, and if any new dental pain, would recommend follow-up with oral surgeon.  Return to the clinic or go to the nearest emergency room if any of your symptoms worsen or new symptoms occur.  If you have lab work done today you will be contacted with your lab results within the next 2 weeks.  If you have not heard from [place] then please contact us. The fastest way to get your results is to register for My Chart.   IF you received an x-ray today, you will receive an invoice from [facility]. Please contact [facility] Radiology at [phone] with questions or concerns regarding your invoice.   IF you received labwork today, you will receive an invoice from [place]. Please contact [facility] at [phone] with questions or concerns regarding your invoice.   Our billing staff will not be able to assist you with questions regarding bills from these companies.  You will be contacted with the lab results as soon as they are available. The fastest way to get your results is to activate your My Chart account. Instructions are located on the last page of this paperwork. If you have not heard from [place] regarding the results in 2 weeks, please contact this office.

## 2019-06-10 NOTE — Progress Notes (Signed)
Subjective:  Patient ID: Peggy Church, female    DOB: March 01, 1956  Age: 64 y.o. MRN: [id number]  CC:  Chief Complaint  Patient presents with  . Facial Pain    rash on L side of pt's face. pt also has swollen glands on the F side of pt's face. pt thinks she has some kind of infection. pt states she has pain in the L side of face as well.pt has tryied acyclovir for this issue. pt states it helped the rash, but nothing else.    HPI Peggy Church presents for   Facial Rash: Started few days ago,  Treated for peridontal abscess 8 months ago, treated by oral surgeon. Implant 2 months ago.  Had been improving, then some pain in left side of face in same area as abscess a few weeks ago - persistent pain, increasing pain past few weeks.. Some swelling on left face at times, swelling around left eye at times - better now. Resolves for a few hours. Bump on side of nose on left 4 days ago - improved now. Some pain in jaw at times - saw oral surgeon 5 days ago - not thought to be pain from dental implant. No hot/cold sensitivity in mouth.  Blurred vision 3 days ago - resolved after 1 day. Has optho - told had chalazion causing sx's prior. Did not call with recent sx's. Grits teeth at times.  Rash cheek area - left of nose about 3 days. More swollen initially/yesterday - min improved today.  Took acyclovir yesterday - 8 pills  Yesterday - 2 pills 4 times, one dose today.  No fever. No rash on tip of nose or ear.  No nasal d/c.  Drinking/eating ok, no sore throat.    History Patient Active Problem List   Diagnosis Date Noted  . BMI 31.0-31.9,adult 11/05/2015  . HTN (hypertension) 06/26/2012   Past Medical History:  Diagnosis Date  . Allergy   . Hypertension   . Migraine    Past Surgical History:  Procedure Laterality Date  . [place]   stretched  . ECTOPIC PREGNANCY SURGERY    . TONSILLECTOMY AND ADENOIDECTOMY    . TUBAL LIGATION     Allergies  Allergen Reactions  .  Benicar [Olmesartan]     Smells and senses things that aren't really there.  . Lisinopril     cough  . Sulfa Drugs Cross Reactors    Prior to Admission medications   Medication Sig Start Date End Date Taking? Authorizing Provider  acyclovir (ZOVIRAX) 200 MG capsule Take 4 capsules (800 mg total) by mouth 2 (two) times daily. For 5 days with outbreak. 04/03/19  Yes [name], MD  amLODipine (NORVASC) 5 MG tablet Take 1 tablet (5 mg total) by mouth daily. 04/03/19  Yes [name], MD  Multiple Vitamin (MULTI-VITAMINS) TABS Take by mouth daily.   Yes [provider]  potassium chloride SA (K-DUR,KLOR-CON) 20 MEQ tablet TAKE 1 TABLET BY MOUTH TWICE DAILY 06/27/18  Yes [name], MD   Social History   Socioeconomic History  . Marital status: Divorced    Spouse name: n/a  . Number of children: 3  . Years of education: college  . Highest education level: Not on file  Occupational History  . Occupation: Consulting civil engineer    Comment: [employer]  Tobacco Use  . Smoking status: Former Smoker    Quit date: 07/18/1991    Years since quitting: 27.9  .  Smokeless tobacco: Never Used  Substance and Sexual Activity  . Alcohol use: Yes    Alcohol/week: 0.0 standard drinks    Comment: rarely  . Drug use: No  . Sexual activity: Not on file  Other Topics Concern  . Not on file  Social History Narrative   Lives with her daughter and fiance.   Social Determinants of Health   Financial Resource Strain:   . Difficulty of Paying Living Expenses: Not on file  Food Insecurity:   . Worried About Charity fundraiser in the Last Year: Not on file  . Ran Out of Food in the Last Year: Not on file  Transportation Needs:   . Lack of Transportation (Medical): Not on file  . Lack of Transportation (Non-Medical): Not on file  Physical Activity:   . Days of Exercise per Week: Not on file  . Minutes of Exercise per Session: Not on file  Stress:   . Feeling of Stress :  Not on file  Social Connections:   . Frequency of Communication with Friends and Family: Not on file  . Frequency of Social Gatherings with Friends and Family: Not on file  . Attends Religious Services: Not on file  . Active Member of Clubs or Organizations: Not on file  . Attends Archivist Meetings: Not on file  . Marital Status: Not on file  Intimate Partner Violence:   . Fear of Current or Ex-Partner: Not on file  . Emotionally Abused: Not on file  . Physically Abused: Not on file  . Sexually Abused: Not on file    Review of Systems Per HPI.   Objective:   Vitals:   06/10/19 1649 06/10/19 1654  BP: (!) 144/88 126/86  Pulse: 82   Temp: 98 F (36.7 C)   TempSrc: Temporal   SpO2: 99%   Weight: 205 lb (93 kg)   Height: 5\' 6"  (1.676 m)      Physical Exam Vitals reviewed.  Constitutional:      General: She is not in acute distress.    Appearance: She is well-developed.  HENT:     Head: Normocephalic and atraumatic.     Jaw: No trismus, swelling or pain on movement (able to open/close without difficulty. ).      Nose: Nose normal.  Eyes:     General: Lids are normal.     Extraocular Movements: Extraocular movements intact.     Conjunctiva/sclera: Conjunctivae normal.     Comments: No lid swelling. No rash.   Cardiovascular:     Rate and Rhythm: Normal rate.  Pulmonary:     Effort: Pulmonary effort is normal.  Neurological:     Mental Status: She is alert and oriented to person, place, and time.     Assessment & Plan:  Peggy Church is a 64 y.o. female . Face pain - Plan: acyclovir (ZOVIRAX) 400 MG tablet, doxycycline (VIBRA-TABS) 100 MG tablet  Rash of face - Plan: acyclovir (ZOVIRAX) 400 MG tablet, doxycycline (VIBRA-TABS) 100 MG tablet  Pain likely related to early cellulitis, versus less likely zoster versus HSV 1.  Some sinus pressure, less likely acute sinusitis.  Negative Hutchinson sign, no eye symptoms.   -Start doxycycline 1 g twice  daily, cover for cellulitis as well as potential sinusitis.  Warm compresses, symptomatic care.  -Continue acyclovir but will dose at zoster dosing  -Recheck in 2 days.  -Ophthalmology follow-up if any return of blurry vision.  Meds ordered this encounter  Medications  .  acyclovir (ZOVIRAX) 400 MG tablet    Sig: Take 2 tablets (800 mg total) by mouth 5 (five) times daily.    Dispense:  70 tablet    Refill:  0  . doxycycline (VIBRA-TABS) 100 MG tablet    Sig: Take 1 tablet (100 mg total) by mouth 2 (two) times daily.    Dispense:  20 tablet    Refill:  0   Patient Instructions     Continue acyclovir but take 2 pills 5 times per day to treat for possible shingles although that is less likely.  Start doxycycline for possible facial infection (can also help sinusitis) warm compresses few times per day, recheck in 2 days.  If any return of blurry vision be seen by her ophthalmologist, and if any new dental pain, would recommend follow-up with oral surgeon.  Return to the clinic or go to the nearest emergency room if any of your symptoms worsen or new symptoms occur.  If you have lab work done today you will be contacted with your lab results within the next 2 weeks.  If you have not heard from [place] then please contact us. The fastest way to get your results is to register for My Chart.   IF you received an x-ray today, you will receive an invoice from [facility]. Please contact [facility] Radiology at [phone] with questions or concerns regarding your invoice.   IF you received labwork today, you will receive an invoice from [place]. Please contact [facility] at [phone] with questions or concerns regarding your invoice.   Our billing staff will not be able to assist you with questions regarding bills from these companies.  You will be contacted with the lab results as soon as they are available. The fastest way to get your results is to activate your My Chart account.  Instructions are located on the last page of this paperwork. If you have not heard from [place] regarding the results in 2 weeks, please contact this office.         Signed, Merri Ray, MD [facility]

## 2019-06-11 ENCOUNTER — Encounter: Payer: Self-pay | Admitting: Family Medicine

## 2019-06-12 ENCOUNTER — Encounter: Payer: Self-pay | Admitting: Family Medicine

## 2019-06-12 ENCOUNTER — Ambulatory Visit: Payer: BC Managed Care – PPO | Admitting: Family Medicine

## 2019-06-12 ENCOUNTER — Other Ambulatory Visit: Payer: Self-pay

## 2019-06-12 VITALS — BP 148/79 | HR 75 | Temp 98.1°F | Ht 66.0 in | Wt 205.6 lb

## 2019-06-12 DIAGNOSIS — R21 Rash and other nonspecific skin eruption: Secondary | ICD-10-CM | POA: Diagnosis not present

## 2019-06-12 DIAGNOSIS — R519 Headache, unspecified: Secondary | ICD-10-CM | POA: Diagnosis not present

## 2019-06-12 DIAGNOSIS — G500 Trigeminal neuralgia: Secondary | ICD-10-CM | POA: Diagnosis not present

## 2019-06-12 NOTE — Progress Notes (Signed)
Subjective:  Patient ID: Peggy Church, female    DOB: 10/19/1955  Age: 64 y.o. MRN: [id number]  CC:  Chief Complaint  Patient presents with  . Follow-up    rash on the left side of face. per patient feels like its getting better still some pain, but not as bad .    HPI Peggy Church presents for   Left face pain, rash: The office visit 2 days ago, possible cellulitis versus less likely rigors, less likely sinus disease.  Started on doxycycline 100 mg twice daily, continued on acyclovir but increased to zoster dosing. Redness on face improved.  Smaller. Not hurting/itching. Feels like pain mostly on left   Some sharp pains in different areas of the face. Pain R eye at times, no change in vision. Slight blurring last week - not today.   Some soreness inside mouth at times prior, started up again this morning. In area of prior tooth where had infection/site of implant. Dentist: Orene Desanctis - no recent appt for these sx's.  listerine - helped some.   History Patient Active Problem List   Diagnosis Date Noted  . BMI 31.0-31.9,adult 11/05/2015  . HTN (hypertension) 06/26/2012   Past Medical History:  Diagnosis Date  . Allergy   . Hypertension   . Migraine    Past Surgical History:  Procedure Laterality Date  . [place]   stretched  . ECTOPIC PREGNANCY SURGERY    . TONSILLECTOMY AND ADENOIDECTOMY    . TUBAL LIGATION     Allergies  Allergen Reactions  . Benicar [Olmesartan]     Smells and senses things that aren't really there.  . Lisinopril     cough  . Sulfa Drugs Cross Reactors    Prior to Admission medications   Medication Sig Start Date End Date Taking? Authorizing Provider  acyclovir (ZOVIRAX) 200 MG capsule Take 4 capsules (800 mg total) by mouth 2 (two) times daily. For 5 days with outbreak. 04/03/19  Yes [name], MD  acyclovir (ZOVIRAX) 400 MG tablet Take 2 tablets (800 mg total) by mouth 5 (five) times daily. 06/10/19  Yes [name], MD  amLODipine (NORVASC) 5 MG tablet Take 1 tablet (5 mg total) by mouth daily. 04/03/19  Yes [name], MD  doxycycline (VIBRA-TABS) 100 MG tablet Take 1 tablet (100 mg total) by mouth 2 (two) times daily. 06/10/19  Yes [name], MD  Multiple Vitamin (MULTI-VITAMINS) TABS Take by mouth daily.   Yes [provider]  potassium chloride SA (K-DUR,KLOR-CON) 20 MEQ tablet TAKE 1 TABLET BY MOUTH TWICE DAILY 06/27/18  Yes [name], MD   Social History   Socioeconomic History  . Marital status: Divorced    Spouse name: n/a  . Number of children: 3  . Years of education: college  . Highest education level: Not on file  Occupational History  . Occupation: Consulting civil engineer    Comment: [place]  Tobacco Use  . Smoking status: Former Smoker    Quit date: 07/18/1991    Years since quitting: 27.9  . Smokeless tobacco: Never Used  Substance and Sexual Activity  . Alcohol use: Yes    Alcohol/week: 0.0 standard drinks    Comment: rarely  . Drug use: No  . Sexual activity: Not on file  Other Topics Concern  . Not on file  Social History Narrative   Lives with her daughter and fiance.   Social Determinants of Health   Financial  Resource Strain:   . Difficulty of Paying Living Expenses: Not on file  Food Insecurity:   . Worried About Charity fundraiser in the Last Year: Not on file  . Ran Out of Food in the Last Year: Not on file  Transportation Needs:   . Lack of Transportation (Medical): Not on file  . Lack of Transportation (Non-Medical): Not on file  Physical Activity:   . Days of Exercise per Week: Not on file  . Minutes of Exercise per Session: Not on file  Stress:   . Feeling of Stress : Not on file  Social Connections:   . Frequency of Communication with Friends and Family: Not on file  . Frequency of Social Gatherings with Friends and Family: Not on file  . Attends Religious Services: Not on file  . Active Member of Clubs or  Organizations: Not on file  . Attends Archivist Meetings: Not on file  . Marital Status: Not on file  Intimate Partner Violence:   . Fear of Current or Ex-Partner: Not on file  . Emotionally Abused: Not on file  . Physically Abused: Not on file  . Sexually Abused: Not on file    Review of Systems   Objective:   Vitals:   06/12/19 1511  BP: (!) 148/79  Pulse: 75  Temp: 98.1 F (36.7 C)  TempSrc: Temporal  SpO2: 98%  Weight: 205 lb 9.6 oz (93.3 kg)  Height: 5\' 6"  (1.676 m)     Physical Exam Vitals reviewed.  HENT:     Head: Normocephalic and atraumatic.      Mouth/Throat:     Dentition: No gum lesions (No oral lesions identified, does have copper, no apparent focal hyperplasia/swelling, no apparent periodontal abscess/discharge.).     Comments: I do not appreciate any focal facial swelling, no periorbital edema or rash appreciated. Neurological:     Mental Status: She is alert.     Assessment & Plan:  Peggy Church is a 64 y.o. female . 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  Appears to be localized to small pustule.  Warm compresses, gentle expression discussed, continue doxycycline, acyclovir for now.  Migratory face pain, describes symptoms suspicious for possible trigeminal neuralgia.  Refer to neurology. No mouth ulceration or abscess, but did recommend dental follow up as pain in area of previous dental procedure. Intermittent eye pain, blurring.  Recommended quick follow-up with ophthalmology.  Improved at this time.    No orders of the defined types were placed in this encounter.  Patient Instructions   Call your eye specialist for appointment as soon as possible for eye symptoms (pain, blurring).  Return to the clinic or go to the nearest emergency room if any of your symptoms  worsen or new symptoms occur.  Call dentist to also be seen in next 1 week - be seen same day if possible for any acute worsening or swelling.  Continue to use antibiotics, apply arm compress to wound on face.  If red bumps is not continuing to improve, follow-up for repeat visit.  I will refer you to neurology to discuss symptoms - possible trigeminal neuralgia.  Return to the clinic or go to the nearest emergency room if any of your symptoms worsen or new symptoms occur.  Recheck in May for blood pressure, sooner if needed for above symptoms.  If you have lab work done today you will be contacted with your lab results within the next 2 weeks.  If you have not heard from [place] then please contact us. The fastest way to get your results is to register for My Chart.   IF you received an x-ray today, you will receive an invoice from [facility]. Please contact [facility] at [phone] with questions or concerns regarding your invoice.   IF you received labwork today, you will receive an invoice from [place]. Please contact [facility] at [phone] with questions or concerns regarding your invoice.   Our billing staff will not be able to assist you with questions regarding bills from these companies.  You will be contacted with the lab results as soon as they are available. The fastest way to get your results is to activate your My Chart account. Instructions are located on the last page of this paperwork. If you have not heard from [place] regarding the results in 2 weeks, please contact this office.         Signed, Merri Ray, MD [facility]

## 2019-06-12 NOTE — Patient Instructions (Addendum)
Call your eye specialist for appointment as soon as possible for eye symptoms (pain, blurring).  Return to the clinic or go to the nearest emergency room if any of your symptoms worsen or new symptoms occur.  Call dentist to also be seen in next 1 week - be seen same day if possible for any acute worsening or swelling.  Continue to use antibiotics, apply arm compress to wound on face.  If red bumps is not continuing to improve, follow-up for repeat visit.  I will refer you to neurology to discuss symptoms - possible trigeminal neuralgia.  Return to the clinic or go to the nearest emergency room if any of your symptoms worsen or new symptoms occur.  Recheck in May for blood pressure, sooner if needed for above symptoms.     If you have lab work done today you will be contacted with your lab results within the next 2 weeks.  If you have not heard from [place] then please contact us. The fastest way to get your results is to register for My Chart.   IF you received an x-ray today, you will receive an invoice from [facility]. Please contact [facility] at [phone] with questions or concerns regarding your invoice.   IF you received labwork today, you will receive an invoice from [place]. Please contact [facility] at [phone] with questions or concerns regarding your invoice.   Our billing staff will not be able to assist you with questions regarding bills from these companies.  You will be contacted with the lab results as soon as they are available. The fastest way to get your results is to activate your My Chart account. Instructions are located on the last page of this paperwork. If you have not heard from [place] regarding the results in 2 weeks, please contact this office.

## 2019-06-18 ENCOUNTER — Encounter: Payer: Self-pay | Admitting: Neurology

## 2019-06-19 ENCOUNTER — Other Ambulatory Visit: Payer: Self-pay | Admitting: Family Medicine

## 2019-06-19 DIAGNOSIS — R21 Rash and other nonspecific skin eruption: Secondary | ICD-10-CM

## 2019-06-19 DIAGNOSIS — R519 Headache, unspecified: Secondary | ICD-10-CM

## 2019-06-19 NOTE — Telephone Encounter (Signed)
Patient was last seen 06/12/19 want a refill on a doxycycline that was written on 06/10/19 #20

## 2019-06-19 NOTE — Telephone Encounter (Signed)
Requested medication (s) are due for refill today- no  Requested medication (s) are on the active medication list -yes  Future visit scheduled - no  Last refill: 06/10/19  Notes to clinic: Patient is requesting medication non assigned to protocol- sent for review   Requested Prescriptions  Pending Prescriptions Disp Refills   doxycycline (VIBRA-TABS) 100 MG tablet [Pharmacy Med Name: DOXYCYCLINE HYCLATE 100 MG 100 Tablet] 20 tablet 0    Sig: TAKE 1 TABLET BY MOUTH TWICE DAILY      Off-Protocol Failed - 06/19/2019 12:06 PM      Failed - Medication not assigned to a protocol, review manually.      Passed - Valid encounter within last 12 months    Recent Outpatient Visits           1 week ago Face pain   [facility], Ranell Patrick, MD   1 week ago Face pain   [facility], Ranell Patrick, MD   2 months ago Essential hypertension   [facility], Ranell Patrick, MD   5 months ago Facial pain syndrome   [facility], Fenton Malling, MD   1 year ago Hypokalemia   [facility], Ranell Patrick, MD       Future Appointments             In 1 month Alda Berthold, DO [facility] Neurology [facility]                Requested Prescriptions  Pending Prescriptions Disp Refills   doxycycline (VIBRA-TABS) 100 MG tablet [Pharmacy Med Name: DOXYCYCLINE HYCLATE 100 MG 100 Tablet] 20 tablet 0    Sig: TAKE 1 TABLET BY MOUTH TWICE DAILY      Off-Protocol Failed - 06/19/2019 12:06 PM      Failed - Medication not assigned to a protocol, review manually.      Passed - Valid encounter within last 12 months    Recent Outpatient Visits           1 week ago Face pain   [facility], Ranell Patrick, MD   1 week ago Face pain   [facility], Ranell Patrick, MD   2 months ago Essential hypertension   [facility], Ranell Patrick, MD   5 months ago Facial pain syndrome   [facility], Fenton Malling, MD   1 year ago Hypokalemia   [facility], Ranell Patrick, MD       Future Appointments             In 1 month Alda Berthold, [facility]

## 2019-06-19 NOTE — Telephone Encounter (Signed)
Patient would like a refill on abx doxycycline is this ok to send or do patient need to be seen again. Last seen by you on 06/12/19

## 2019-06-19 NOTE — Telephone Encounter (Signed)
Should not need second course of antibiotic, unless symptoms have returned, or worsened.  If so please schedule visit as soon as possible for evaluation.

## 2019-06-20 ENCOUNTER — Other Ambulatory Visit: Payer: Self-pay

## 2019-06-20 ENCOUNTER — Ambulatory Visit: Payer: BC Managed Care – PPO | Admitting: Family Medicine

## 2019-06-20 VITALS — BP 138/90 | HR 79 | Temp 98.3°F | Ht 66.0 in | Wt 204.0 lb

## 2019-06-20 DIAGNOSIS — R202 Paresthesia of skin: Secondary | ICD-10-CM | POA: Diagnosis not present

## 2019-06-20 DIAGNOSIS — R21 Rash and other nonspecific skin eruption: Secondary | ICD-10-CM | POA: Diagnosis not present

## 2019-06-20 DIAGNOSIS — L539 Erythematous condition, unspecified: Secondary | ICD-10-CM | POA: Diagnosis not present

## 2019-06-20 MED ORDER — METRONIDAZOLE 1 % EX GEL: Freq: Every day | CUTANEOUS | 0 refills | Status: DC

## 2019-06-20 NOTE — Patient Instructions (Addendum)
aveeno lotion or sarna for itching areas on ankle if needed. Recheck if persistent or worsening redness.   Abdominal bump - appears to be healing - no new treatment needed.  Face symptoms could have been rosacea.  Stop Dial, start milder cleaner such as cetaphil - see other info below. If not improving - can fill metronidazole to apply once per day.   Return to the clinic or go to the nearest emergency room if any of your symptoms worsen or new symptoms occur.  Rosacea Rosacea is a long-term (chronic) condition that affects the skin of the face, including the cheeks, nose, forehead, and chin. This condition can also affect the eyes. Rosacea causes blood vessels near the surface of the skin to enlarge, which results in redness. What are the causes? The cause of this condition is not known. Certain triggers can make rosacea worse, including:  Hot baths.  Exercise.  Sunlight.  Very hot or cold temperatures.  Hot or spicy foods and drinks.  Drinking alcohol.  Stress.  Taking blood pressure medicine.  Long-term use of topical steroids on the face. What increases the risk? You are more likely to develop this condition if you:  Are older than 63 years of age.  Are a woman.  Have light-colored skin (light complexion).  Have a family history of rosacea. What are the signs or symptoms? Symptoms of this condition include:  Redness of the face.  Red bumps or pimples on the face.  A red, enlarged nose.  Blushing easily.  Red lines on the skin.  Irritated, burning, or itchy feeling in the eyes.  Swollen eyelids.  Drainage from the eyes.  Feeling like there is something in your eye. How is this diagnosed? This condition is diagnosed with a medical history and physical exam. How is this treated? There is no cure for this condition, but treatment can help to control your symptoms. Your health care provider may recommend that you see a skin specialist (dermatologist).  Treatment may include:  Medicines that are applied to the skin or taken by mouth (orally). This can include antibiotic medicines.  Laser treatment to improve the appearance of the skin.  Surgery. This is rare. Your health care provider will also recommend the best way to take care of your skin. Even after your skin improves, you will likely need to continue treatment to prevent your rosacea from coming back. Follow these instructions at home: Skin care Take care of your skin as told by your health care provider. You may be told to do these things:  Wash your skin gently two or more times each day.  Use mild soap.  Use a sunscreen or sunblock with SPF 30 or greater.  Use gentle cosmetics that are meant for sensitive skin.  Shave with an electric shaver instead of a blade. Lifestyle  Try to keep track of what foods trigger this condition. Avoid any triggers. These may include: ? Spicy foods. ? Seafood. ? Cheese. ? Hot liquids. ? Nuts. ? Chocolate. ? Iodized salt.  Do not drink alcohol.  Avoid extremely cold or hot temperatures.  Try to reduce your stress. If you need help, talk with your health care provider.  When you exercise, do these things to stay cool: ? Limit sun exposure to your face. ? Use a fan. ? Do shorter and more frequent intervals of exercise. General instructions  Take and apply over-the-counter and prescription medicines only as told by your health care provider.  If you were  prescribed an antibiotic medicine, apply it or take it as told by your health care provider. Do not stop using the antibiotic even if your condition improves.  If your eyelids are affected, apply warm compresses to them. Do this as told by your health care provider.  Keep all follow-up visits as told by your health care provider. This is important. Contact a health care provider if:  Your symptoms get worse.  Your symptoms do not improve after 2 months of treatment.  You  have new symptoms.  You have any changes in vision or you have problems with your eyes, such as redness or itching.  You feel depressed.  You lose your appetite.  You have trouble concentrating. Summary  Rosacea is a long-term (chronic) condition that affects the skin of the face, including the cheeks, nose, forehead, and chin.  Take care of your skin as told by your health care provider.  Take and apply over-the-counter and prescription medicines only as told by your health care provider.  Contact a health care provider if your symptoms get worse or if you have any changes in vision or other problems with your eyes, such as redness or itching.  Keep all follow-up visits as told by your health care provider. This is important. This information is not intended to replace advice given to you by your health care provider. Make sure you discuss any questions you have with your health care provider. Document Revised: 09/27/2017 Document Reviewed: 09/27/2017 Elsevier Patient Education  [employer].    If you have lab work done today you will be contacted with your lab results within the next 2 weeks.  If you have not heard from [place] then please contact us. The fastest way to get your results is to register for My Chart.   IF you received an x-ray today, you will receive an invoice from [facility]. Please contact [facility] Radiology at [phone] with questions or concerns regarding your invoice.   IF you received labwork today, you will receive an invoice from [place]. Please contact [facility] at [phone] with questions or concerns regarding your invoice.   Our billing staff will not be able to assist you with questions regarding bills from these companies.  You will be contacted with the lab results as soon as they are available. The fastest way to get your results is to activate your My Chart account. Instructions are located on the last page of this paperwork. If  you have not heard from [place] regarding the results in 2 weeks, please contact this office.

## 2019-06-20 NOTE — Progress Notes (Signed)
Subjective:  Patient ID: Peggy Church, female    DOB: [birth year]  Age: 64 y.o. MRN: [id number]  CC:  Chief Complaint  Patient presents with  . Follow-up    rash on face shows some improvment. pt feels some tingeling in her face. pt states she has a new site of the rash on her L abdominal area.    HPI Peggy Church presents for   Left face pain, dysesthesias, rash: See prior visits on February 1, February 3.  Treated for cellulitis with small patch left face with doxycycline 100 mg twice daily, continued on acyclovir at zoster dosing on February 1, but no vesicles.  Redness on face was improving February 3.  Still had some intermittent sharp pains in different areas of the face, pain in the right eye at times, soreness inside the mouth at times, primarily pain on the left.  Rash was improving last visit with small papule/pustule.  Warm compress with gentle expression/pressure discussed, continued on doxycycline, acyclovir.  Possible trigeminal neuralgia with other face pains, referred to neurology, and dental follow-up for any pain in the previous area of dental procedure.  Ophthalmology visit planned for intermittent eye pain.  Occasional sensation in left face - previous redness improving. Last dose doxycycline this morning. Occasional similar pains in face - few times per day. Quick onset/relief.  Discussed gabapentin - declined for now. Has neuro appt 3/12.   New rash in left abdominal area. Small bump on left abdomen - past week - core in area expressed or skin. No pain. Slight itching. Better now. No meds - soap and water.   Itchy/red ankle yesterday/this am - resolved now.  Used some lotion - jergens, udder cream. burning feeling. Min itchy feeling now.   Dial soap for face wash. Crystal cleansing bar at times at night.   History Patient Active Problem List   Diagnosis Date Noted  . BMI 31.0-31.9,adult 11/05/2015  . HTN (hypertension) 06/26/2012   Past Medical History:   Diagnosis Date  . Allergy   . Hypertension   . Migraine    Past Surgical History:  Procedure Laterality Date  . [place]   stretched  . ECTOPIC PREGNANCY SURGERY    . TONSILLECTOMY AND ADENOIDECTOMY    . TUBAL LIGATION     Allergies  Allergen Reactions  . Benicar [Olmesartan]     Smells and senses things that aren't really there.  . Lisinopril     cough  . Sulfa Drugs Cross Reactors    Prior to Admission medications   Medication Sig Start Date End Date Taking? Authorizing Provider  acyclovir (ZOVIRAX) 200 MG capsule Take 4 capsules (800 mg total) by mouth 2 (two) times daily. For 5 days with outbreak. 04/03/19  Yes [name], MD  acyclovir (ZOVIRAX) 400 MG tablet Take 2 tablets (800 mg total) by mouth 5 (five) times daily. 06/10/19  Yes [name], MD  amLODipine (NORVASC) 5 MG tablet Take 1 tablet (5 mg total) by mouth daily. 04/03/19  Yes [name], MD  doxycycline (VIBRA-TABS) 100 MG tablet Take 1 tablet (100 mg total) by mouth 2 (two) times daily. 06/10/19  Yes [name], MD  Multiple Vitamin (MULTI-VITAMINS) TABS Take by mouth daily.   Yes [provider]  potassium chloride SA (K-DUR,KLOR-CON) 20 MEQ tablet TAKE 1 TABLET BY MOUTH TWICE DAILY 06/27/18  Yes [name], MD   Social History   Socioeconomic History  . Marital status: Divorced  Spouse name: n/a  . Number of children: 3  . Years of education: college  . Highest education level: Not on file  Occupational History  . Occupation: Consulting civil engineer    Comment: [employer]  Tobacco Use  . Smoking status: Former Smoker    Quit date: 07/18/1991    Years since quitting: 27.9  . Smokeless tobacco: Never Used  Substance and Sexual Activity  . Alcohol use: Yes    Alcohol/week: 0.0 standard drinks    Comment: rarely  . Drug use: No  . Sexual activity: Not on file  Other Topics Concern  . Not on file  Social History Narrative   Lives with her  daughter and fiance.   Social Determinants of Health   Financial Resource Strain:   . Difficulty of Paying Living Expenses: Not on file  Food Insecurity:   . Worried About Charity fundraiser in the Last Year: Not on file  . Ran Out of Food in the Last Year: Not on file  Transportation Needs:   . Lack of Transportation (Medical): Not on file  . Lack of Transportation (Non-Medical): Not on file  Physical Activity:   . Days of Exercise per Week: Not on file  . Minutes of Exercise per Session: Not on file  Stress:   . Feeling of Stress : Not on file  Social Connections:   . Frequency of Communication with Friends and Family: Not on file  . Frequency of Social Gatherings with Friends and Family: Not on file  . Attends Religious Services: Not on file  . Active Member of Clubs or Organizations: Not on file  . Attends Archivist Meetings: Not on file  . Marital Status: Not on file  Intimate Partner Violence:   . Fear of Current or Ex-Partner: Not on file  . Emotionally Abused: Not on file  . Physically Abused: Not on file  . Sexually Abused: Not on file    Review of Systems   Objective:   Vitals:   06/20/19 1625 06/20/19 1630  BP: (!) 150/95 138/90  Pulse: 79   Temp: 98.3 F (36.8 C)   TempSrc: Temporal   SpO2: 96%   Weight: 204 lb (92.5 kg)   Height: 5\' 6"  (1.676 m)      Physical Exam Constitutional:      General: She is not in acute distress.    Appearance: She is well-developed.  HENT:     Head: Normocephalic and atraumatic.  Cardiovascular:     Rate and Rhythm: Normal rate.  Pulmonary:     Effort: Pulmonary effort is normal.  Skin:    Comments: See photo face, left flank. Lower legs. Papule on upper forehead, min induration of papule/erythematous area left face.   Neurological:     Mental Status: She is alert and oriented to person, place, and time.           Assessment & Plan:  Peggy Church is a 64 y.o. female . Rash of face - Plan:  metroNIDAZOLE (METROGEL) 1 % gel Tingling of face - Plan: metroNIDAZOLE (METROGEL) 1 % gel  -Improved area of possible cellulitis.  With appearance of that area and location, differential includes rosacea.  Option of metronidazole gel, handout given on home treatment/avoidance measures.  Specifically recommended decreasing abrasive soaps/cleansers.  -Follow-up phone call with some increased redness, swelling, and additional 1 week of doxycycline prescribed with RTC precautions, risks of prolonged antibiotics were discussed.  Consider dermatology eval if persistent.  -  Neuro follow-up planned, differential of tingling includes trigeminal neuralgia or may be rosacea.  Treatment as above.  Redness of skin  -Reports in lower leg, reassuring exam at present.  RTC precautions if new rashes, erythema, wounds.  -Healing papule lesion on left flank, RTC precautions if new/worsening areas.  Meds ordered this encounter  Medications  . metroNIDAZOLE (METROGEL) 1 % gel    Sig: Apply topically daily.    Dispense:  45 g    Refill:  0   Patient Instructions    aveeno lotion or sarna for itching areas on ankle if needed. Recheck if persistent or worsening redness.   Abdominal bump - appears to be healing - no new treatment needed.  Face symptoms could have been rosacea.  Stop Dial, start milder cleaner such as cetaphil - see other info below. If not improving - can fill metronidazole to apply once per day.   Return to the clinic or go to the nearest emergency room if any of your symptoms worsen or new symptoms occur.  Rosacea Rosacea is a long-term (chronic) condition that affects the skin of the face, including the cheeks, nose, forehead, and chin. This condition can also affect the eyes. Rosacea causes blood vessels near the surface of the skin to enlarge, which results in redness. What are the causes? The cause of this condition is not known. Certain triggers can make rosacea worse, including:  Hot  baths.  Exercise.  Sunlight.  Very hot or cold temperatures.  Hot or spicy foods and drinks.  Drinking alcohol.  Stress.  Taking blood pressure medicine.  Long-term use of topical steroids on the face. What increases the risk? You are more likely to develop this condition if you:  Are older than 63 years of age.  Are a woman.  Have light-colored skin (light complexion).  Have a family history of rosacea. What are the signs or symptoms? Symptoms of this condition include:  Redness of the face.  Red bumps or pimples on the face.  A red, enlarged nose.  Blushing easily.  Red lines on the skin.  Irritated, burning, or itchy feeling in the eyes.  Swollen eyelids.  Drainage from the eyes.  Feeling like there is something in your eye. How is this diagnosed? This condition is diagnosed with a medical history and physical exam. How is this treated? There is no cure for this condition, but treatment can help to control your symptoms. Your health care provider may recommend that you see a skin specialist (dermatologist). Treatment may include:  Medicines that are applied to the skin or taken by mouth (orally). This can include antibiotic medicines.  Laser treatment to improve the appearance of the skin.  Surgery. This is rare. Your health care provider will also recommend the best way to take care of your skin. Even after your skin improves, you will likely need to continue treatment to prevent your rosacea from coming back. Follow these instructions at home: Skin care Take care of your skin as told by your health care provider. You may be told to do these things:  Wash your skin gently two or more times each day.  Use mild soap.  Use a sunscreen or sunblock with SPF 30 or greater.  Use gentle cosmetics that are meant for sensitive skin.  Shave with an electric shaver instead of a blade. Lifestyle  Try to keep track of what foods trigger this condition.  Avoid any triggers. These may include: ? Spicy foods. ? Seafood. ?  Cheese. ? Hot liquids. ? Nuts. ? Chocolate. ? Iodized salt.  Do not drink alcohol.  Avoid extremely cold or hot temperatures.  Try to reduce your stress. If you need help, talk with your health care provider.  When you exercise, do these things to stay cool: ? Limit sun exposure to your face. ? Use a fan. ? Do shorter and more frequent intervals of exercise. General instructions  Take and apply over-the-counter and prescription medicines only as told by your health care provider.  If you were prescribed an antibiotic medicine, apply it or take it as told by your health care provider. Do not stop using the antibiotic even if your condition improves.  If your eyelids are affected, apply warm compresses to them. Do this as told by your health care provider.  Keep all follow-up visits as told by your health care provider. This is important. Contact a health care provider if:  Your symptoms get worse.  Your symptoms do not improve after 2 months of treatment.  You have new symptoms.  You have any changes in vision or you have problems with your eyes, such as redness or itching.  You feel depressed.  You lose your appetite.  You have trouble concentrating. Summary  Rosacea is a long-term (chronic) condition that affects the skin of the face, including the cheeks, nose, forehead, and chin.  Take care of your skin as told by your health care provider.  Take and apply over-the-counter and prescription medicines only as told by your health care provider.  Contact a health care provider if your symptoms get worse or if you have any changes in vision or other problems with your eyes, such as redness or itching.  Keep all follow-up visits as told by your health care provider. This is important. This information is not intended to replace advice given to you by your health care provider. Make sure you discuss  any questions you have with your health care provider. Document Revised: 09/27/2017 Document Reviewed: 09/27/2017 Elsevier Patient Education  [employer].    If you have lab work done today you will be contacted with your lab results within the next 2 weeks.  If you have not heard from [place] then please contact us. The fastest way to get your results is to register for My Chart.   IF you received an x-ray today, you will receive an invoice from [facility]. Please contact [facility] at [phone] with questions or concerns regarding your invoice.   IF you received labwork today, you will receive an invoice from [facility]. Please contact [facility] at [phone] with questions or concerns regarding your invoice.   Our billing staff will not be able to assist you with questions regarding bills from these companies.  You will be contacted with the lab results as soon as they are available. The fastest way to get your results is to activate your My Chart account. Instructions are located on the last page of this paperwork. If you have not heard from [place] regarding the results in 2 weeks, please contact this office.         Signed, Merri Ray, MD [facility]

## 2019-06-21 ENCOUNTER — Encounter: Payer: Self-pay | Admitting: Family Medicine

## 2019-06-21 DIAGNOSIS — R21 Rash and other nonspecific skin eruption: Secondary | ICD-10-CM

## 2019-06-21 MED ORDER — DOXYCYCLINE HYCLATE 100 MG PO TABS: 100.0000 mg | ORAL_TABLET | Freq: Two times a day (BID) | ORAL | 0 refills | Status: DC

## 2019-06-21 MED FILL — DOXYCYCLINE HYCLATE 100 MG: 100 | 7 days supply | Qty: 14 | Fill #0

## 2019-06-23 ENCOUNTER — Encounter: Payer: Self-pay | Admitting: Family Medicine

## 2019-06-24 ENCOUNTER — Other Ambulatory Visit: Payer: Self-pay | Admitting: Family Medicine

## 2019-06-24 DIAGNOSIS — R21 Rash and other nonspecific skin eruption: Secondary | ICD-10-CM

## 2019-06-24 DIAGNOSIS — R519 Headache, unspecified: Secondary | ICD-10-CM

## 2019-06-24 MED FILL — metroNIDAZOLE 1 % GEL: 1 | 30 days supply | Qty: 60 | Fill #0

## 2019-06-24 MED FILL — AMLODIPINE BESYLATE 5 MG TA: 5 | 90 days supply | Qty: 90 | Fill #1

## 2019-06-24 NOTE — Telephone Encounter (Signed)
Requested medications are due for refill today?  Yes  Requested medications are on active medication list?  Yes  Last Refill:  06/10/2019 # 70 with 0 refills  Future visit scheduled? No - has an appointment with Neurology in 3 weeks  Notes to Clinic:

## 2019-07-02 ENCOUNTER — Encounter: Payer: Self-pay | Admitting: Family Medicine

## 2019-07-03 ENCOUNTER — Encounter: Payer: Self-pay | Admitting: Family Medicine

## 2019-07-19 ENCOUNTER — Other Ambulatory Visit: Payer: Self-pay

## 2019-07-19 ENCOUNTER — Encounter: Payer: Self-pay | Admitting: Neurology

## 2019-07-19 ENCOUNTER — Ambulatory Visit: Payer: BC Managed Care – PPO | Admitting: Neurology

## 2019-07-19 VITALS — BP 135/72 | HR 68 | Ht 66.0 in | Wt 204.0 lb

## 2019-07-19 DIAGNOSIS — R519 Headache, unspecified: Secondary | ICD-10-CM | POA: Diagnosis not present

## 2019-07-19 NOTE — Progress Notes (Signed)
[facility] Clinic Note - Initial Visit   Date: 07/19/19  Peggy Church MRN: [id number] DOB: [birth year]   Dear Dr. Carlota Raspberry:  Thank you for your kind referral of Peggy Church for consultation of left facial pain. Although her history is well known to you, please allow [place] to reiterate it for the purpose of our medical record. The patient was accompanied to the clinic by self    History of Present Illness: Peggy Church is a 64 y.o. right-handed female presenting for evaluation of left facial pain.  She has a long history of left facial pain which started about 20 years ago, when she had left tooth abscess.  This was treated with antibiotics and since this time, she has had ongoing dull pain over the left cheek region, associated with swelling. In 2020, she was found to have left tooth abscess which required a series of antibiotics and tooth extraction.  She continues to have dull pain over the left cheek, which is very localized.  Associated with the pain, she has swelling over the cheek and neck and occasionally has tingling over the left cheek.  Tingling and facial pain does not start in the jaw/ear region and radiate towards the nose.  A month ago, she developed a rash over the left nasolabial fold which started in February and was treated with acyclovir.   The rash did not extend into her jaw or temple region.  She also has a rash over the left lower leg.    She is most concerned about swelling which can involve the side of the nose, eye, throat, and inside of her mouth.  Swelling tends to occur daily and spells.  She had improvement on doxycycline, but since it is stopped symptoms return.  Out-side paper records, electronic medical record, and images have been reviewed where available and summarized as:  CT head wo contrast 10/06/2017: There is a degree of sulcal atrophy. Ventricles normal in size and configuration. No intracranial mass or hemorrhage. Gray-white  compartments appear normal.  There are foci of arterial vascular calcification. There is mucosal thickening in several ethmoid air cells. There is probable cerumen in the right external auditory canal.  Lab Results  Component Value Date   HGBA1C 5.4 12/15/2017   No results found for: [id number] Lab Results  Component Value Date   TSH 1.099 11/04/2013     Past Medical History:  Diagnosis Date  . Allergy   . Hypertension   . Migraine     Past Surgical History:  Procedure Laterality Date  . [place]   stretched  . DENTAL SURGERY    . ECTOPIC PREGNANCY SURGERY    . TONSILLECTOMY AND ADENOIDECTOMY    . TUBAL LIGATION       Medications:  Outpatient Encounter Medications as of 07/19/2019  Medication Sig  . acyclovir (ZOVIRAX) 200 MG capsule Take 4 capsules (800 mg total) by mouth 2 (two) times daily. For 5 days with outbreak.  Marland Kitchen amLODipine (NORVASC) 5 MG tablet Take 1 tablet (5 mg total) by mouth daily.  . Multiple Vitamin (MULTI-VITAMINS) TABS Take by mouth daily.  . potassium chloride SA (K-DUR,KLOR-CON) 20 MEQ tablet TAKE 1 TABLET BY MOUTH TWICE DAILY  . [DISCONTINUED] acyclovir (ZOVIRAX) 400 MG tablet Take 2 tablets (800 mg total) by mouth 5 (five) times daily.  . [DISCONTINUED] doxycycline (VIBRA-TABS) 100 MG tablet Take 1 tablet (100 mg total) by mouth 2 (two) times daily.  . [DISCONTINUED] metroNIDAZOLE (METROGEL) 1 %  gel Apply topically daily.   No facility-administered encounter medications on file as of 07/19/2019.    Allergies:  Allergies  Allergen Reactions  . Benicar [Olmesartan]     Smells and senses things that aren't really there.  . Lisinopril     cough  . Sulfa Drugs Cross Reactors     Family History: Family History  Problem Relation Age of Onset  . Hyperlipidemia Mother   . Hypertension Mother   . Stroke Mother   . Heart disease Mother   . Cancer Father        prostate  . Hyperlipidemia Father   . Hypertension Father   .  Cancer Brother     Social History: Social History   Tobacco Use  . Smoking status: Former Smoker    Quit date: 07/18/1991    Years since quitting: 28.0  . Smokeless tobacco: Never Used  Substance Use Topics  . Alcohol use: Yes    Alcohol/week: 0.0 standard drinks    Comment: rarely  . Drug use: No   Social History   Social History Narrative   Lives with her daughter and fiance.   Right handed   Lives in 2 story home.    Vital Signs:  BP 135/72   Pulse 68   Ht 5\' 6"  (1.676 m)   Wt 204 lb (92.5 kg)   SpO2 98%   BMI 32.93 kg/m   Neurological Exam: MENTAL STATUS including orientation to time, place, person, recent and remote memory, attention span and concentration, language, and fund of knowledge is normal.  Speech is not dysarthric.  CRANIAL NERVES: II:  No visual field defects.   III-IV-VI: Pupils equal round.  Normal conjugate, extra-ocular eye movements in all directions of gaze.  No nystagmus.  No ptosis.   V:  Normal facial sensation.    VII:  Normal facial symmetry and movements.  No rash is observed.  Facial movements are full and intact. VIII:  Normal hearing and vestibular function.   IX-X:  Normal palatal movement.   XI:  Normal shoulder shrug and head rotation.   XII:  Normal tongue strength and range of motion, no deviation or fasciculation.  MOTOR:  No atrophy, fasciculations or abnormal movements.  No pronator drift.   Upper Extremity:  Right  Left  Deltoid  5/5   5/5   Biceps  5/5   5/5   Triceps  5/5   5/5   Infraspinatus 5/5  5/5  Medial pectoralis 5/5  5/5  Wrist extensors  5/5   5/5   Wrist flexors  5/5   5/5   Finger extensors  5/5   5/5   Finger flexors  5/5   5/5   Dorsal interossei  5/5   5/5   Abductor pollicis  5/5   5/5   Tone (Ashworth scale)  0  0   Lower Extremity:  Right  Left  Hip flexors  5/5   5/5   Hip extensors  5/5   5/5   Adductor 5/5  5/5  Abductor 5/5  5/5  Knee flexors  5/5   5/5   Knee extensors  5/5   5/5     Dorsiflexors  5/5   5/5   Plantarflexors  5/5   5/5   Toe extensors  5/5   5/5   Toe flexors  5/5   5/5   Tone (Ashworth scale)  0  0   MSRs:  Right  Left                  brachioradialis 2+  2+  biceps 2+  2+  triceps 2+  2+  patellar 2+  2+  ankle jerk 2+  2+  Hoffman no  no  plantar response down  down   SENSORY:  Normal and symmetric perception of light touch, pinprick, vibration, and proprioception.  Romberg's sign absent.   COORDINATION/GAIT: Normal finger-to- nose-finger and heel-to-shin.  Intact rapid alternating movements bilaterally.  Able to rise from a chair without using arms.  Gait narrow based and stable. Tandem and stressed gait intact.    IMPRESSION: Left facial pain and swelling is most likely related to underlying dental problems. Patient was reassured that she has no signs to suggest she has primary nerve pathology such as trigeminal neuralgia or post-herpetic neuralgia.  No testing indicated.   Thank you for allowing me to participate in patient's care.  If I can answer any additional questions, I would be pleased to do so.    Sincerely,    Alie Hardgrove K. Posey Pronto, DO

## 2019-08-01 ENCOUNTER — Other Ambulatory Visit: Payer: Self-pay | Admitting: Family Medicine

## 2019-08-01 NOTE — Telephone Encounter (Signed)
Requested medication (s) are due for refill today: yes  Requested medication (s) are on the active medication list: yes  Last refill:  06/27/2018  Future visit scheduled:  Notes to clinic: pt has not scheduled appt for 09/2019; see pharmacy note    Requested Prescriptions  Pending Prescriptions Disp Refills   potassium chloride SA (KLOR-CON) 20 MEQ tablet [Pharmacy Med Name: POTASSIUM CHLORIDE CRYS ER 20 Tablet] 180 tablet 0    Sig: TAKE 1 TABLET BY MOUTH TWICE DAILY (NEED TO SCHEDULE 6 MONTH APPT IN MAY)      Endocrinology:  Minerals - Potassium Supplementation Passed - 08/01/2019 11:14 AM      Passed - K in normal range and within 360 days    Potassium  Date Value Ref Range Status  04/03/2019 4.0 3.5 - 5.2 mmol/L Final          Passed - Cr in normal range and within 360 days    Creat  Date Value Ref Range Status  11/05/2015 0.84 0.50 - 1.05 mg/dL Final    Comment:      For patients > or = 63 years of age: The upper reference limit for Creatinine is approximately 13% higher for people identified as African-American.      Creatinine, Ser  Date Value Ref Range Status  04/03/2019 0.96 0.57 - 1.00 mg/dL Final          Passed - Valid encounter within last 12 months    Recent Outpatient Visits           1 month ago Rash of face   [facility], Ranell Patrick, MD   1 month ago Face pain   [facility], Ranell Patrick, MD   1 month ago Face pain   [facility], Ranell Patrick, MD   4 months ago Essential hypertension   [facility], Ranell Patrick, MD   6 months ago Facial pain syndrome   [facility], Fenton Malling, MD

## 2019-08-06 MED FILL — POTASSIUM CHLORIDE CRYS ER: 20 | 90 days supply | Qty: 180 | Fill #0

## 2019-08-17 IMAGING — CT CT HEAD W/O CM
4 series · 16 of 47 positions shown, 18 images · non-contrast
Comparison: None.

CLINICAL DATA: Headache. Soft tissue swelling left periorbital
region.

EXAM:
CT HEAD WITHOUT CONTRAST
TECHNIQUE: Contiguous axial images were obtained from the base of the skull
through the vertex without intravenous contrast.

[Series 3: head without · axial · non-contrast · 0.44mm/px · z∈[-135,-15]mm · 7 of 32 slices shown, 9 images]
[im 4/32  brain]
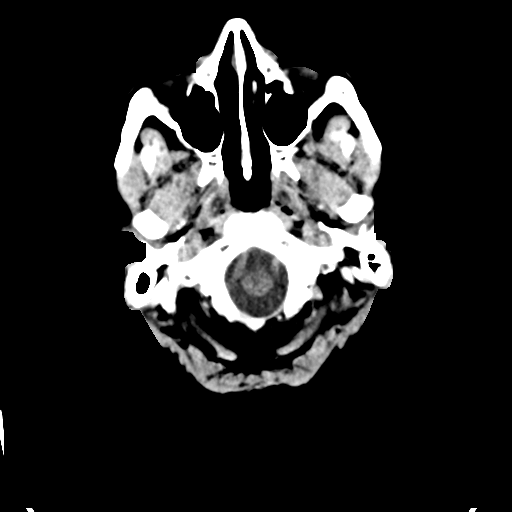
[im 4/32  bone]
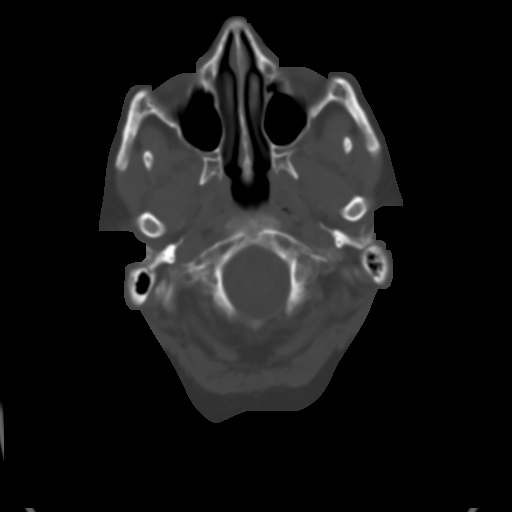
[im 8/32  brain]
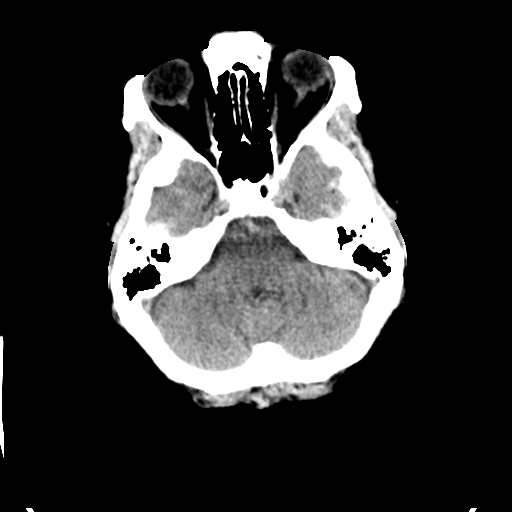
[im 12/32  brain]
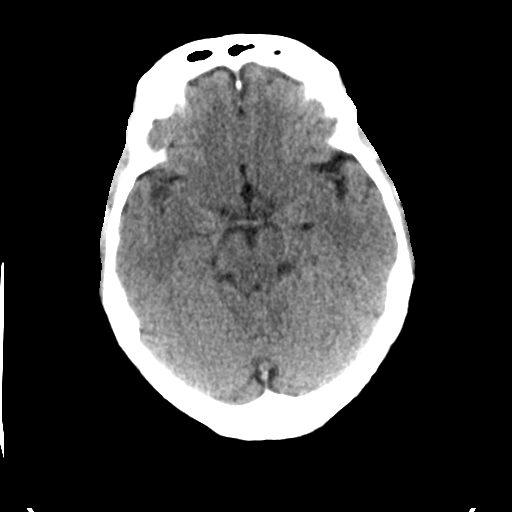
[im 16/32  brain]
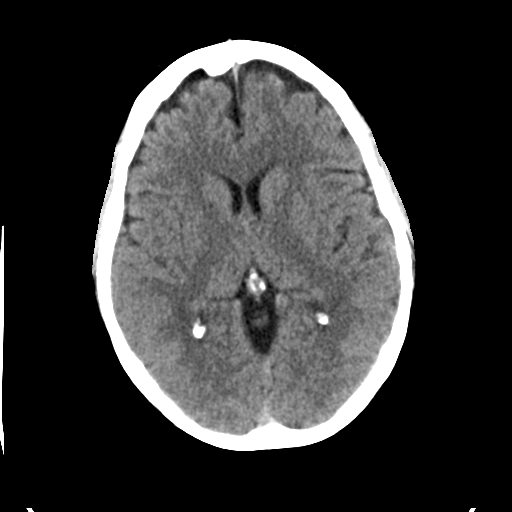
[im 20/32  brain]
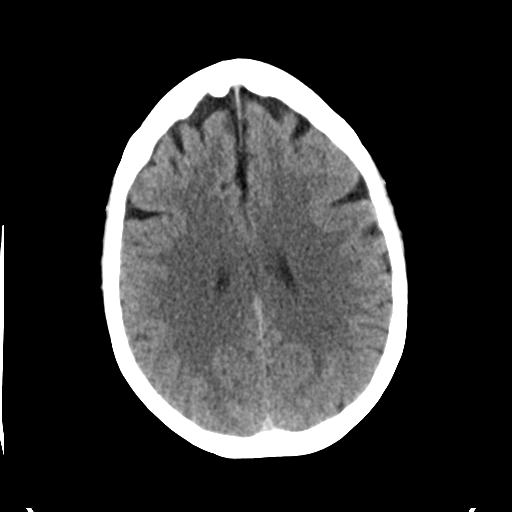
[im 20/32  bone]
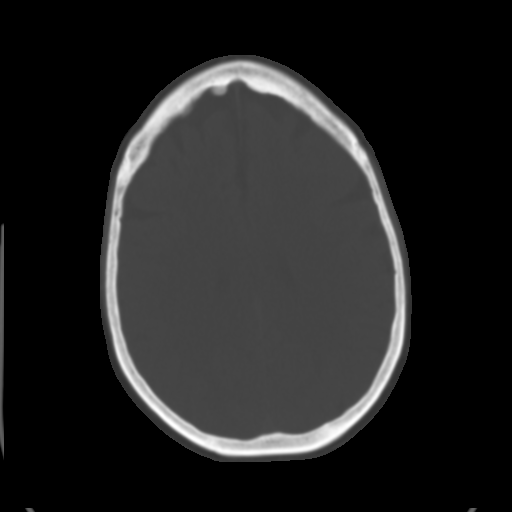
[im 24/32  brain]
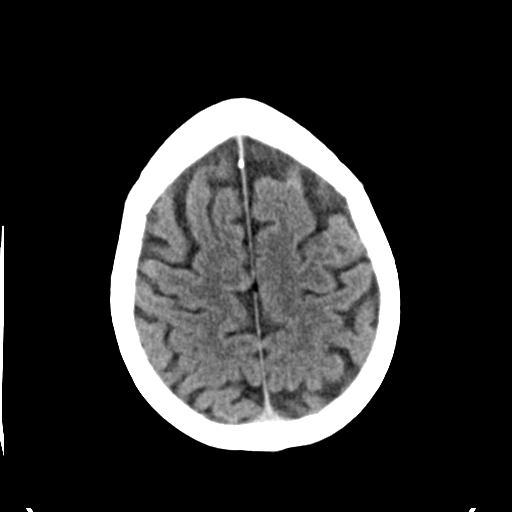
[im 28/32  brain]
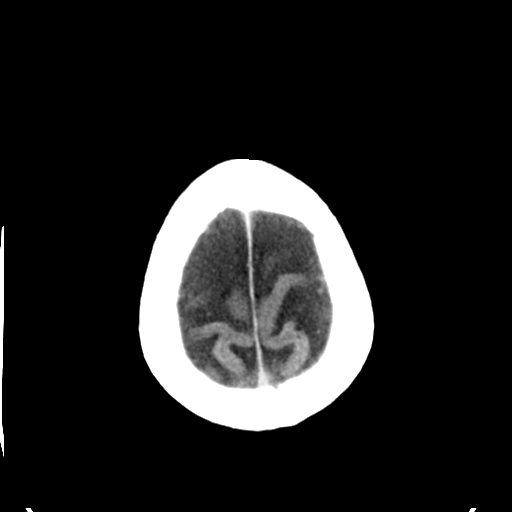

[Series 4: head bone · axial · 0.44mm/px · z∈[-136,-104]mm · 3 of 80 slices shown]
[im 8/80  bone]
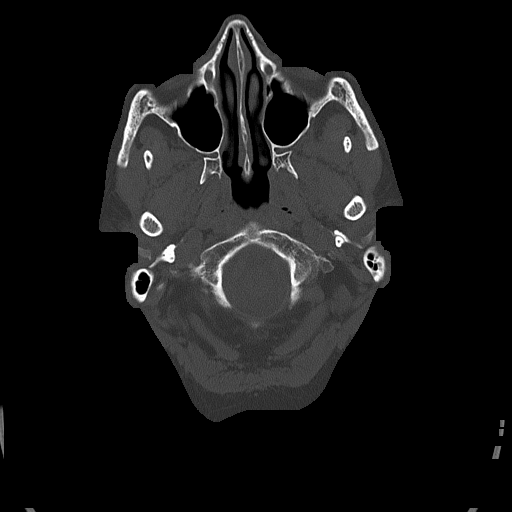
[im 16/80  bone]
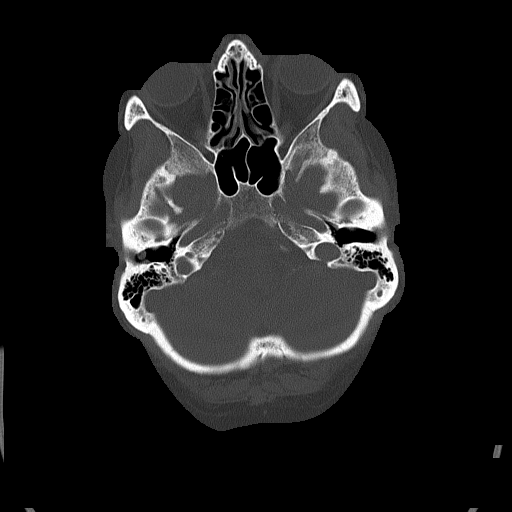
[im 24/80  bone]
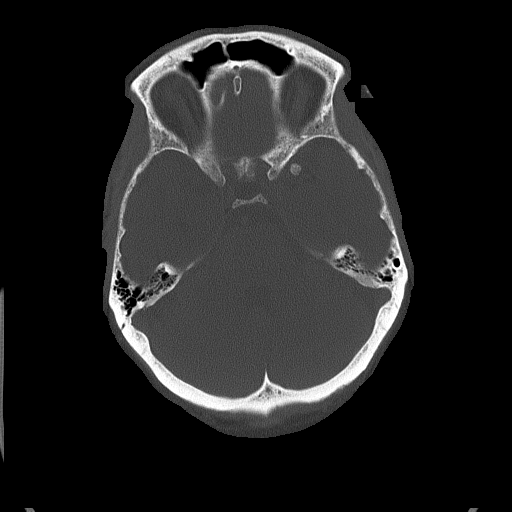

[Series 5: head without cor · coronal · non-contrast · 0.29mm/px · 3 of 67 slices shown]
[im 23/67  brain]
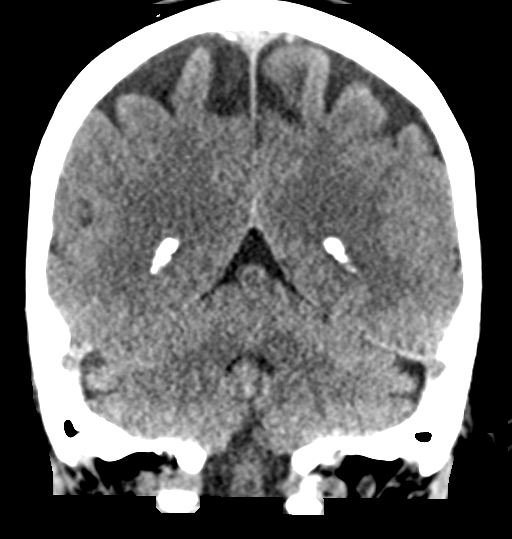
[im 30/67  brain]
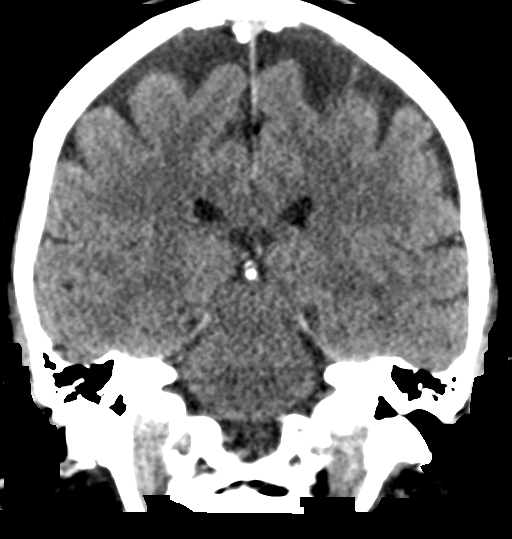
[im 37/67  brain]
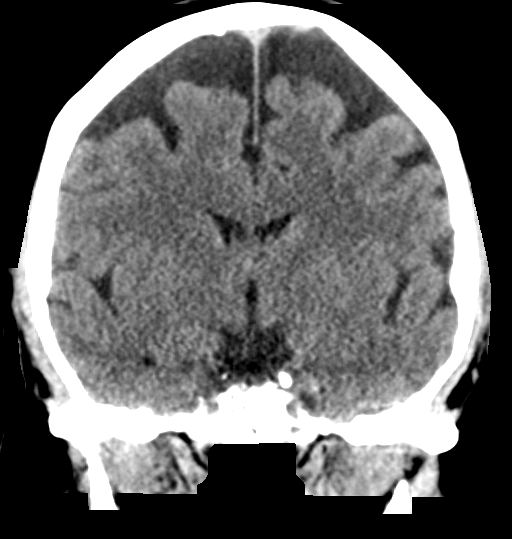

[Series 6: head without sag · sagittal · non-contrast · 0.31mm/px · 3 of 49 slices shown]
[im 17/49  brain]
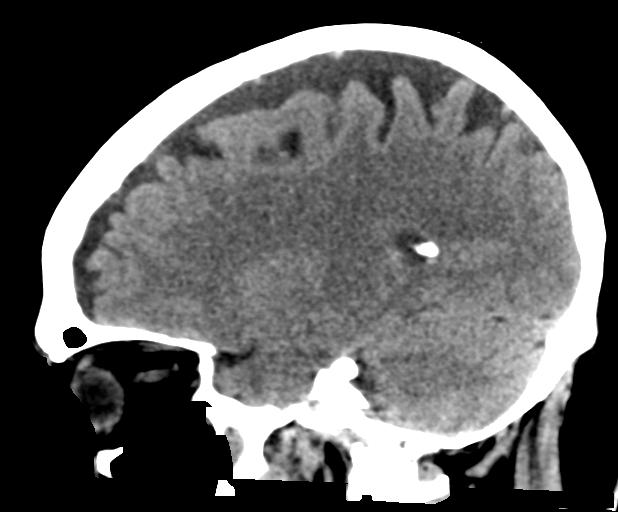
[im 25/49  brain]
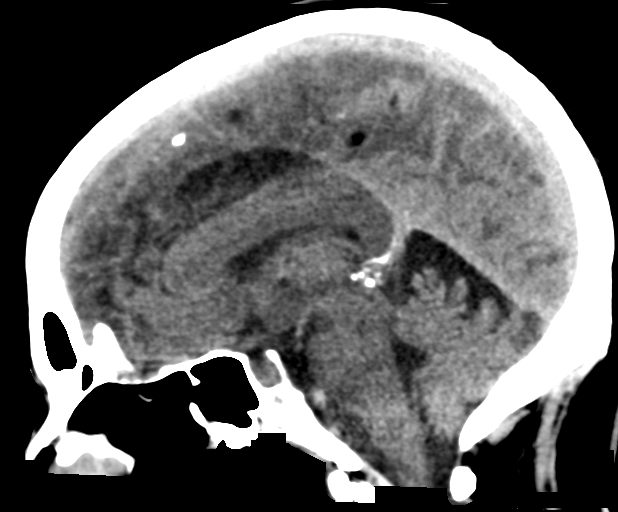
[im 33/49  brain]
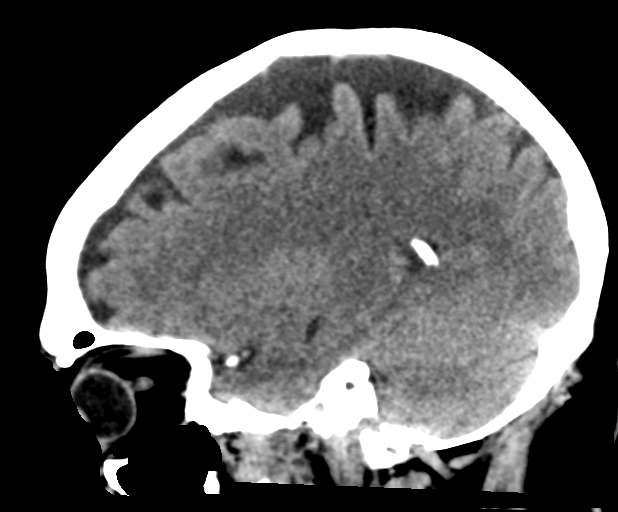

[16 of 47 positions shown; findings below may reference images not displayed]

FINDINGS: Brain: The ventricles are normal in size and configuration. The
frontal sulci and to a lesser extent parietal sulci are rather
prominent bilaterally. There is no intracranial mass, hemorrhage,
extra-axial fluid collection, or midline shift. Gray-white
compartments appear normal. There is no evident acute infarct.

Vascular: No hyperdense vessel. There is calcification in each
carotid siphon region.

Skull: Bony calvarium appears intact.

Sinuses/Orbits: There is mucosal thickening in several ethmoid air
cells. Other visualized paranasal sinuses are clear. Orbits appear
symmetric bilaterally.

Other: Mastoid air cells are clear. There is debris in the right
external auditory canal.
IMPRESSION: There is a degree of sulcal atrophy. Ventricles normal in size and
configuration. No intracranial mass or hemorrhage. Gray-white
compartments appear normal.

There are foci of arterial vascular calcification. There is mucosal
thickening in several ethmoid air cells. There is probable cerumen
in the right external auditory canal.

## 2019-09-13 ENCOUNTER — Other Ambulatory Visit: Payer: Self-pay | Admitting: Family Medicine

## 2019-09-13 DIAGNOSIS — I10 Essential (primary) hypertension: Secondary | ICD-10-CM

## 2019-09-13 MED FILL — AMLODIPINE BESYLATE 5 MG TA: 5 | 90 days supply | Qty: 90 | Fill #0

## 2019-11-05 ENCOUNTER — Other Ambulatory Visit: Payer: Self-pay | Admitting: Family Medicine

## 2019-11-05 MED FILL — traMADol HCL 50 MG TABS: 50 | 4 days supply | Qty: 16 | Fill #0

## 2019-11-05 MED FILL — AMOXICILLIN 500 MG CAPSULE: 500 | 7 days supply | Qty: 21 | Fill #0

## 2019-11-05 NOTE — Telephone Encounter (Signed)
11/05/2019 - PATIENT REQUESTING A REFILL ON HER POTASSIUM CHLORIDE BUT SHE NEEDS TO HAVE AN OFFICE VISIT WITH DR. Carlota Raspberry. I TRIED TO CALL AND SCHEDULE BUT HAD TO LEAVE A MESSAGE ON HER VOICE MAIL TO RETURN MY CALL. I WILL ROUTE THIS MESSAGE BACK TO THE CLINICAL TEAM. [place]

## 2019-11-05 NOTE — Telephone Encounter (Signed)
Refill request for Potassium last refilled 08/05/29; pharmacy note reads pt needs to schedule follow up appt around May; no upcoming visits noted; attempted to contact pt; left message on voicemail to call office; will route to office for final disposition.  Requested medication (s) are due for refill today: yes  Requested medication (s) are on the active medication list: yes  Last refill: 08/06/19  Future visit scheduled: no  Notes to clinic:  No follow up visit per pharmacy note

## 2019-11-05 NOTE — Telephone Encounter (Signed)
Please schedule appt for refills and labs

## 2019-11-05 NOTE — Telephone Encounter (Signed)
Per DR Carlota Raspberry msg patient need a 6 month f/u appt which was due in May.Appt needed before we refill K+(Potassium)

## 2019-12-19 MED FILL — AMLODIPINE BESYLATE 5 MG TA: 5 | 90 days supply | Qty: 90 | Fill #1

## 2019-12-27 MED FILL — traMADol HCL 50 MG TABS: 50 | 4 days supply | Qty: 16 | Fill #1

## 2020-01-08 MED FILL — DOXYCYCLINE HYCLATE 100 MG: 100 | 14 days supply | Qty: 14 | Fill #0

## 2020-01-23 MED FILL — METHYLPREDNISOLONE 4 MG TBP: 4 | 6 days supply | Qty: 21 | Fill #0

## 2020-01-31 ENCOUNTER — Encounter: Payer: Self-pay | Admitting: *Deleted

## 2020-04-03 ENCOUNTER — Other Ambulatory Visit: Payer: Self-pay | Admitting: Family Medicine

## 2020-04-03 DIAGNOSIS — I10 Essential (primary) hypertension: Secondary | ICD-10-CM

## 2020-04-03 NOTE — Telephone Encounter (Signed)
Requested medication (s) are due for refill today:   Yes  Requested medication (s) are on the active medication list:   Yes  Future visit scheduled:   No   Last ordered: 09/13/2019 #90, 1 refill  Returned because he hasn't had a valid encounter during the last 6 months.    [facility] does not schedule for this practice.   Requested Prescriptions  Pending Prescriptions Disp Refills   amLODipine (NORVASC) 5 MG tablet [Pharmacy Med Name: AMLODIPINE BESYLATE 5 MG TA 5 Tablet] 90 tablet 1    Sig: TAKE 1 TABLET BY MOUTH ONCE DAILY      Cardiovascular:  Calcium Channel Blockers Failed - 04/03/2020  1:16 PM      Failed - Valid encounter within last 6 months    Recent Outpatient Visits           9 months ago Rash of face   [facility], Ranell Patrick, MD   9 months ago Face pain   [facility], Ranell Patrick, MD   9 months ago Face pain   [facility], Ranell Patrick, MD   1 year ago Essential hypertension   [facility], Ranell Patrick, MD   1 year ago Facial pain syndrome   [facility], Fenton Malling, MD              Passed - Last BP in normal range    BP Readings from Last 1 Encounters:  07/19/19 135/72

## 2020-04-06 ENCOUNTER — Other Ambulatory Visit: Payer: Self-pay | Admitting: Family Medicine

## 2020-04-06 MED FILL — AMLODIPINE BESYLATE 5 MG TA: 5 | 90 days supply | Qty: 90 | Fill #0

## 2020-06-12 ENCOUNTER — Telehealth (INDEPENDENT_AMBULATORY_CARE_PROVIDER_SITE_OTHER): Payer: BC Managed Care – PPO | Admitting: Family Medicine

## 2020-06-12 ENCOUNTER — Encounter: Payer: Self-pay | Admitting: Family Medicine

## 2020-06-12 VITALS — Ht 66.0 in | Wt 200.0 lb

## 2020-06-12 DIAGNOSIS — R509 Fever, unspecified: Secondary | ICD-10-CM

## 2020-06-12 DIAGNOSIS — R519 Headache, unspecified: Secondary | ICD-10-CM | POA: Diagnosis not present

## 2020-06-12 DIAGNOSIS — J029 Acute pharyngitis, unspecified: Secondary | ICD-10-CM

## 2020-06-12 LAB — POCT RAPID STREP A (OFFICE): Rapid Strep A Screen: NEGATIVE

## 2020-06-12 NOTE — Progress Notes (Signed)
Parent  Virtual Visit via Telephone Note  I connected with Peggy Church on 06/12/20 at 12:37 PM by telephone and verified that I am speaking with the correct person using two identifiers. Patient location: home My location: office   I discussed the limitations, risks, security and privacy concerns of performing an evaluation and management service by telephone and the availability of in person appointments. I also discussed with the patient that there may be a patient responsible charge related to this service. The patient expressed understanding and agreed to proceed, consent obtained  Chief complaint: Chief Complaint  Patient presents with  . URI    Pt reports sore throat since last night. Temp and headache. Pt reports having some bumps in her mouth a few days ago as well. Pt hasn't been vaccinated. Pt has had COVID 2x.     History of Present Illness: Peggy Church is a 65 y.o. female  Sore throat: Sore throat started last night. Temp 101.8 last night. Able to swallow normally, no dyspnea.  No pus on tonsils noted. No cough. Headache noted since 3 days ago - better now. Chills in afternoon. Drinking fluids. Feeling fatigued.   Had some bumps inside left cheek last week, resolved. Small blisters. Min sore throat last week with mouth rash. No sick contacts, no contact with hand/foot/mouth disease.  No covid contacts, but boss was sick- he tested negative for covid.   Would be interested in covid treatments.   Tx: listerine gargles, asa this am.   Has not had covid vaccine, but had covid infection in 2020 x 2.    Patient Active Problem List   Diagnosis Date Noted  . BMI 31.0-31.9,adult 11/05/2015  . HTN (hypertension) 06/26/2012   Past Medical History:  Diagnosis Date  . Allergy   . Hypertension   . Migraine    Past Surgical History:  Procedure Laterality Date  . [place]   stretched  . DENTAL SURGERY    . ECTOPIC PREGNANCY SURGERY    . TONSILLECTOMY  AND ADENOIDECTOMY    . TUBAL LIGATION     Allergies  Allergen Reactions  . Benicar [Olmesartan]     Smells and senses things that aren't really there.  . Lisinopril     cough  . Sulfa Drugs Cross Reactors    Prior to Admission medications   Medication Sig Start Date End Date Taking? Authorizing Provider  acyclovir (ZOVIRAX) 200 MG capsule Take 4 capsules (800 mg total) by mouth 2 (two) times daily. For 5 days with outbreak. 04/03/19  Yes [name], MD  amLODipine (NORVASC) 5 MG tablet Take 1 tablet (5 mg total) by mouth daily. Appointment required for future refills. 04/06/20  Yes [name], MD  Multiple Vitamin (MULTI-VITAMINS) TABS Take by mouth daily.   Yes [provider]  potassium chloride SA (KLOR-CON) 20 MEQ tablet TAKE 1 TABLET BY MOUTH TWICE DAILY (NEED TO SCHEDULE 6 MONTH APPT IN MAY) 08/02/19  Yes [name], MD   Social History   Socioeconomic History  . Marital status: Divorced    Spouse name: n/a  . Number of children: 3  . Years of education: college  . Highest education level: Not on file  Occupational History  . Occupation: Consulting civil engineer    Comment: [employer]  Tobacco Use  . Smoking status: Former Smoker    Quit date: 07/18/1991    Years since quitting: 28.9  . Smokeless tobacco: Never Used  Substance and Sexual  Activity  . Alcohol use: Yes    Alcohol/week: 0.0 standard drinks    Comment: rarely  . Drug use: No  . Sexual activity: Not on file  Other Topics Concern  . Not on file  Social History Narrative   Lives with her daughter and fiance.   Right handed   Lives in 2 story home.   Social Determinants of Health   Financial Resource Strain: Not on file  Food Insecurity: Not on file  Transportation Needs: Not on file  Physical Activity: Not on file  Stress: Not on file  Social Connections: Not on file  Intimate Partner Violence: Not on file   Observations/Objective: Vitals:   06/12/20 1156  Weight:  200 lb (90.7 kg)  Height: 5\' 6"  (1.676 m)  No distress on phone call, speaking in full sentences with coherent responses.  [place] distress, no audible stridor or wheeze.  All questions were answered with understanding of plan expressed.  Results for orders placed or performed in visit on 06/12/20  POCT rapid strep A  Result Value Ref Range   Rapid Strep A Screen Negative Negative     Assessment and Plan: Sore throat - Plan: COVID-19, Flu A+B and RSV, POCT rapid strep A, Culture, Group A Strep  Fever, unspecified - Plan: COVID-19, Flu A+B and RSV, POCT rapid strep A, Culture, Group A Strep  Acute nonintractable headache, unspecified headache type - Plan: COVID-19, Flu A+B and RSV Initial bumps/blisters in mouth last week with slight irritation in throat, worsening sore throat pain past few days, chills and headache for the past 3 days.  Differential includes viral illness such as coxsackievirus but also with differential of COVID-19, and strep infection.  Able to clear secretions normally, no respiratory distress.  Will initially try outpatient symptomatic care with testing as above for Covid, flu, RSV, strep.  Tylenol for fever headache, salt water gargles, urgent care/ER precautions  Follow Up Instructions: Drive up testing today, urgent care/ER precautions given   I discussed the assessment and treatment plan with the patient. The patient was provided an opportunity to ask questions and all were answered. The patient agreed with the plan and demonstrated an understanding of the instructions.   The patient was advised to call back or seek an in-person evaluation if the symptoms worsen or if the condition fails to improve as anticipated.  I provided 13 minutes of non-face-to-face time during this encounter.  Signed,   Merri Ray, MD [facility].  06/12/20

## 2020-06-12 NOTE — Patient Instructions (Signed)
° ° ° °  If you have lab work done today you will be contacted with your lab results within the next 2 weeks.  If you have not heard from us then please contact us. The fastest way to get your results is to register for My Chart. ° ° °IF you received an x-ray today, you will receive an invoice from [facility]. Please contact [facility] at [phone] with questions or concerns regarding your invoice.  ° °IF you received labwork today, you will receive an invoice from [facility]. Please contact [facility] at [phone] with questions or concerns regarding your invoice.  ° °Our billing staff will not be able to assist you with questions regarding bills from these companies. ° °You will be contacted with the lab results as soon as they are available. The fastest way to get your results is to activate your My Chart account. Instructions are located on the last page of this paperwork. If you have not heard from us regarding the results in 2 weeks, please contact this office. °  ° ° ° °

## 2020-06-13 ENCOUNTER — Encounter: Payer: Self-pay | Admitting: Family Medicine

## 2020-06-13 LAB — COVID-19, FLU A+B AND RSV
Influenza A, NAA: NOT DETECTED
Influenza B, NAA: NOT DETECTED
RSV, NAA: NOT DETECTED
SARS-CoV-2, NAA: DETECTED — AB

## 2020-06-15 LAB — CULTURE, GROUP A STREP: Strep A Culture: NEGATIVE

## 2020-06-15 NOTE — Telephone Encounter (Signed)
called pt with results this am. See lab note.

## 2020-06-29 ENCOUNTER — Other Ambulatory Visit: Payer: Self-pay | Admitting: Family Medicine

## 2020-06-29 DIAGNOSIS — I10 Essential (primary) hypertension: Secondary | ICD-10-CM

## 2020-06-29 NOTE — Telephone Encounter (Signed)
Requested medication (s) are due for refill today: yes  Requested medication (s) are on the active medication list: yes  Last refill:  04/06/20 #90 0 refills  Future visit scheduled: no  Notes to clinic:  called patient to request to set up appt. No answer, left message to call clinic back and set up appt. Do you want courtesy refill for Rx?     Requested Prescriptions  Pending Prescriptions Disp Refills   amLODipine (NORVASC) 5 MG tablet [Pharmacy Med Name: AMLODIPINE BESYLATE 5 MG TA 5 Tablet] 90 tablet 0    Sig: Take 1 tablet (5 mg total) by mouth daily. Appointment required for future refills.      Cardiovascular:  Calcium Channel Blockers Passed - 06/29/2020  5:30 PM      Passed - Last BP in normal range    BP Readings from Last 1 Encounters:  07/19/19 135/72          Passed - Valid encounter within last 6 months    Recent Outpatient Visits           2 weeks ago Sore throat   [facility], Ranell Patrick, MD   1 year ago Rash of face   [facility], Ranell Patrick, MD   1 year ago Face pain   [facility], Ranell Patrick, MD   1 year ago Face pain   [facility], Ranell Patrick, MD   1 year ago Essential hypertension   [facility], Ranell Patrick, MD

## 2020-06-30 ENCOUNTER — Other Ambulatory Visit: Payer: Self-pay | Admitting: Family Medicine

## 2020-06-30 MED FILL — AMLODIPINE BESYLATE 5 MG TA: 5 | 90 days supply | Qty: 90 | Fill #0

## 2020-07-29 ENCOUNTER — Ambulatory Visit: Payer: BC Managed Care – PPO | Admitting: Family Medicine

## 2020-10-08 ENCOUNTER — Other Ambulatory Visit: Payer: Self-pay | Admitting: Family Medicine

## 2020-10-08 ENCOUNTER — Other Ambulatory Visit (HOSPITAL_BASED_OUTPATIENT_CLINIC_OR_DEPARTMENT_OTHER): Payer: Self-pay

## 2020-10-08 DIAGNOSIS — I10 Essential (primary) hypertension: Secondary | ICD-10-CM

## 2020-10-08 MED ORDER — AMLODIPINE BESYLATE 5 MG PO TABS
ORAL_TABLET | ORAL | 0 refills | Status: DC
  Filled 2020-10-08: qty 30, 30d supply, fill #0

## 2020-10-08 NOTE — Telephone Encounter (Signed)
LM making pt aware that she needs to schedule an appt and no more refills will be given

## 2020-10-08 NOTE — Telephone Encounter (Signed)
Overdue for appointment 30 days rx given. Please schedule appointment for HTN.

## 2020-11-04 ENCOUNTER — Other Ambulatory Visit (HOSPITAL_COMMUNITY): Payer: Self-pay

## 2020-11-04 ENCOUNTER — Other Ambulatory Visit (HOSPITAL_BASED_OUTPATIENT_CLINIC_OR_DEPARTMENT_OTHER): Payer: Self-pay

## 2020-11-04 ENCOUNTER — Other Ambulatory Visit: Payer: Self-pay

## 2020-11-04 ENCOUNTER — Encounter: Payer: Self-pay | Admitting: Family Medicine

## 2020-11-04 ENCOUNTER — Ambulatory Visit: Payer: BC Managed Care – PPO | Admitting: Family Medicine

## 2020-11-04 VITALS — BP 134/82 | HR 73 | Temp 98.0°F | Wt 198.4 lb

## 2020-11-04 DIAGNOSIS — R002 Palpitations: Secondary | ICD-10-CM | POA: Diagnosis not present

## 2020-11-04 DIAGNOSIS — F439 Reaction to severe stress, unspecified: Secondary | ICD-10-CM

## 2020-11-04 DIAGNOSIS — E785 Hyperlipidemia, unspecified: Secondary | ICD-10-CM

## 2020-11-04 DIAGNOSIS — I10 Essential (primary) hypertension: Secondary | ICD-10-CM | POA: Diagnosis not present

## 2020-11-04 DIAGNOSIS — F419 Anxiety disorder, unspecified: Secondary | ICD-10-CM

## 2020-11-04 LAB — COMPREHENSIVE METABOLIC PANEL
ALT: 10 U/L (ref 0–35)
AST: 13 U/L (ref 0–37)
Albumin: 4.5 g/dL (ref 3.5–5.2)
Alkaline Phosphatase: 105 U/L (ref 39–117)
BUN: 19 mg/dL (ref 6–23)
CO2: 28 mEq/L (ref 19–32)
Calcium: 9.5 mg/dL (ref 8.4–10.5)
Chloride: 101 mEq/L (ref 96–112)
Creatinine, Ser: 0.85 mg/dL (ref 0.40–1.20)
GFR: 72.46 mL/min (ref >60.00)
Glucose, Bld: 89 mg/dL (ref 70–99)
Potassium: 3.7 mEq/L (ref 3.5–5.1)
Sodium: 140 mEq/L (ref 135–145)
Total Bilirubin: 0.9 mg/dL (ref 0.2–1.2)
Total Protein: 6.8 g/dL (ref 6.0–8.3)

## 2020-11-04 LAB — CBC
HCT: 44.1 % (ref 36.0–46.0)
Hemoglobin: 15.5 g/dL — ABNORMAL HIGH (ref 12.0–15.0)
MCHC: 35.2 g/dL (ref 30.0–36.0)
MCV: 82.9 fl (ref 78.0–100.0)
Platelets: 336 10*3/uL (ref 150.0–400.0)
RBC: 5.33 Mil/uL — ABNORMAL HIGH (ref 3.87–5.11)
RDW: 13.8 % (ref 11.5–15.5)
WBC: 7.6 10*3/uL (ref 4.0–10.5)

## 2020-11-04 LAB — LIPID PANEL
Cholesterol: 340 mg/dL — ABNORMAL HIGH (ref 0–200)
HDL: 58.2 mg/dL (ref >39.00)
NonHDL: 281.4
Total CHOL/HDL Ratio: 6
Triglycerides: 280 mg/dL — ABNORMAL HIGH (ref 0.0–149.0)
VLDL: 56 mg/dL — ABNORMAL HIGH (ref 0.0–40.0)

## 2020-11-04 LAB — LDL CHOLESTEROL, DIRECT: Direct LDL: 247 mg/dL

## 2020-11-04 LAB — TSH: TSH: 1.45 u[IU]/mL (ref 0.35–4.50)

## 2020-11-04 MED ORDER — AMLODIPINE BESYLATE 5 MG PO TABS
7.5000 mg | ORAL_TABLET | Freq: Every day | ORAL | 1 refills | Status: DC
  Filled 2020-11-04 (×2): qty 135, 90d supply, fill #0
  Filled 2021-03-10: qty 45, 30d supply, fill #1
  Filled 2021-04-23: qty 45, 30d supply, fill #2
  Filled 2021-05-28: qty 45, 30d supply, fill #3

## 2020-11-04 MED ORDER — HYDROXYZINE HCL 10 MG PO TABS
10.0000 mg | ORAL_TABLET | Freq: Three times a day (TID) | ORAL | 0 refills | Status: DC | PRN
  Filled 2020-11-04 (×2): qty 30, 10d supply, fill #0

## 2020-11-04 NOTE — Patient Instructions (Addendum)
I will check labs, but if heart palpitations return, I do want you to meet with cardiology.  I would also recommend meeting with lipid specialist about different cholesterol medicine you may be able to tolerate better. Let me know when I can refer you.   Can try higher dose of amlodipine at 1.5 pills per day. If more leg swelling or new side effects let me know. .   I do recommend meeting with a therapist.  See if an EAP program is in place with your work. If not, here are some numbers.   Here are a few options for counseling:  [facility]:  [place] [phone]   Hydroxyzine can be taken for sleep or if you are having more anxiety symptoms.   Return to the clinic or go to the nearest emergency room if any of your symptoms worsen or new symptoms occur.  Managing Anxiety, Adult After being diagnosed with an anxiety disorder, you may be relieved to know why you have felt or behaved a certain way. You may also feel overwhelmed about the treatment ahead and what it will mean for your life. With care and support, youcan manage this condition and recover from it. How to manage lifestyle changes Managing stress and anxiety  Stress is your body's reaction to life changes and events, both good and bad. Most stress will last just a few hours, but stress can be ongoing and can lead to more than just stress. Although stress can play a major role in anxiety, it is not the same as anxiety. Stress is usually caused by something external, such as a deadline, test, or competition. Stress normally passes after thetriggering event has ended.  Anxiety is caused by something internal, such as imagining a terrible outcome or worrying that something will go wrong that will devastate you. Anxiety often does not go away even after the triggering event is over, and it can become long-term (chronic) worry. It is important to understand the differences  between stress and anxiety and to manage your stress effectively so that it does not lead to ananxious response. Talk with your health care provider or a counselor to learn more about reducing anxiety and stress. He or she may suggest tension reduction techniques, such as: Music therapy. This can include creating or listening to music that you enjoy and that inspires you. Mindfulness-based meditation. This involves being aware of your normal breaths while not trying to control your breathing. It can be done while sitting or walking. Centering prayer. This involves focusing on a word, phrase, or sacred image that means something to you and brings you peace. Deep breathing. To do this, expand your stomach and inhale slowly through your nose. Hold your breath for 3-5 seconds. Then exhale slowly, letting your stomach muscles relax. Self-talk. This involves identifying thought patterns that lead to anxiety reactions and changing those patterns. Muscle relaxation. This involves tensing muscles and then relaxing them. Choose a tension reduction technique that suits your lifestyle and personality. These techniques take time and practice. Set aside 5-15 minutes a day to do them. Therapists can offer counseling and training in these techniques. The training to help with anxiety may be covered by some insurance plans. Other things you can do to manage stress and anxiety include: Keeping a stress/anxiety diary. This can help you learn what triggers your reaction and then learn ways to manage your response. Thinking about how you react to certain situations. You may not be  able to control everything, but you can control your response. Making time for activities that help you relax and not feeling guilty about spending your time in this way. Visual imagery and yoga can help you stay calm and relax.  Medicines Medicines can help ease symptoms. Medicines for anxiety include: Anti-anxiety  drugs. Antidepressants. Medicines are often used as a primary treatment for anxiety disorder. Medicines will be prescribed by a health care provider. When used together, medicines, psychotherapy, and tension reduction techniques may be the most effectivetreatment. Relationships Relationships can play a big part in helping you recover. Try to spend more time connecting with trusted friends and family members. Consider going to couples counseling, taking family education classes, or going to familytherapy. Therapy can help you and others better understand your condition. How to recognize changes in your anxiety Everyone responds differently to treatment for anxiety. Recovery from anxiety happens when symptoms decrease and stop interfering with your daily activities at home or work. This may mean that you will start to: Have better concentration and focus. Worry will interfere less in your daily thinking. Sleep better. Be less irritable. Have more energy. Have improved memory. It is important to recognize when your condition is getting worse. Contact your health care provider if your symptoms interfere with home or work and you feellike your condition is not improving. Follow these instructions at home: Activity Exercise. Most adults should do the following: Exercise for at least 150 minutes each week. The exercise should increase your heart rate and make you sweat (moderate-intensity exercise). Strengthening exercises at least twice a week. Get the right amount and quality of sleep. Most adults need 7-9 hours of sleep each night. Lifestyle  Eat a healthy diet that includes plenty of vegetables, fruits, whole grains, low-fat dairy products, and lean protein. Do not eat a lot of foods that are high in solid fats, added sugars, or salt. Make choices that simplify your life. Do not use any products that contain nicotine or tobacco, such as cigarettes, e-cigarettes, and chewing tobacco. If you need  help quitting, ask your health care provider. Avoid caffeine, alcohol, and certain over-the-counter cold medicines. These may make you feel worse. Ask your pharmacist which medicines to avoid.  General instructions Take over-the-counter and prescription medicines only as told by your health care provider. Keep all follow-up visits as told by your health care provider. This is important. Where to find support You can get help and support from these sources: Self-help groups. Online and [employer]. A trusted spiritual leader. Couples counseling. Family education classes. Family therapy. Where to find more information You may find that joining a support group helps you deal with your anxiety. The following sources can help you locate counselors or support groups near you: [place]: www.mentalhealthamerica.net Anxiety and Depression Association of [place] (ADAA): https://www.clark.net/ National Alliance on Mental Illness (NAMI): www.nami.org Contact a health care provider if you: Have a hard time staying focused or finishing daily tasks. Spend many hours a day feeling worried about everyday life. Become exhausted by worry. Start to have headaches, feel tense, or have nausea. Urinate more than normal. Have diarrhea. Get help right away if you have: A racing heart and shortness of breath. Thoughts of hurting yourself or others. If you ever feel like you may hurt yourself or others, or have thoughts about taking your own life, get help right away. You can go to your nearest emergency department or call: Your local emergency services (911 in the [place] A  suicide crisis helpline, such as the [facility] at 917-512-0958. This is open 24 hours a day. Summary Taking steps to learn and use tension reduction techniques can help calm you and help prevent triggering an anxiety reaction. When used together, medicines, psychotherapy, and tension reduction  techniques may be the most effective treatment. Family, friends, and partners can play a big part in helping you recover from an anxiety disorder. This information is not intended to replace advice given to you by your health care provider. Make sure you discuss any questions you have with your healthcare provider. Document Revised: 09/25/2018 Document Reviewed: 09/25/2018 Elsevier Patient Education  [place].   Palpitations Palpitations are feelings that your heartbeat is irregular or is faster than normal. It may feel like your heart is fluttering or skipping a beat. Palpitations are usually not a serious problem. They may be caused by many things, including smoking, caffeine, alcohol, stress, and certain medicines or drugs. Most causes of palpitations are not serious. However, some palpitations can be a sign of a serious problem. You may need further tests to rule outserious medical problems. Follow these instructions at home:     Pay attention to any changes in your condition. Take these actions to helpmanage your symptoms: Eating and drinking Avoid foods and drinks that may cause palpitations. These may include: Caffeinated coffee, tea, soft drinks, diet pills, and energy drinks. Chocolate. Alcohol. Lifestyle Take steps to reduce your stress and anxiety. Things that can help you relax include: Yoga. Mind-body activities, such as deep breathing, meditation, or using words and images to create positive thoughts (guided imagery). Physical activity, such as swimming, jogging, or walking. Tell your health care provider if your palpitations increase with activity. If you have chest pain or shortness of breath with activity, do not continue the activity until you are seen by your health care provider. Biofeedback. This is a method that helps you learn to use your mind to control things in your body, such as your heartbeat. Do not use drugs, including cocaine or ecstasy. Do not use  marijuana. Get plenty of rest and sleep. Keep a regular bed time. General instructions Take over-the-counter and prescription medicines only as told by your health care provider. Do not use any products that contain nicotine or tobacco, such as cigarettes and e-cigarettes. If you need help quitting, ask your health care provider. Keep all follow-up visits as told by your health care provider. This is important. These may include visits for further testing if palpitations do not go away or get worse. Contact a health care provider if you: Continue to have a fast or irregular heartbeat after 24 hours. Notice that your palpitations occur more often. Get help right away if you: Have chest pain or shortness of breath. Have a severe headache. Feel dizzy or you faint. Summary Palpitations are feelings that your heartbeat is irregular or is faster than normal. It may feel like your heart is fluttering or skipping a beat. Palpitations may be caused by many things, including smoking, caffeine, alcohol, stress, certain medicines, and drugs. Although most causes of palpitations are not serious, some causes can be a sign of a serious medical problem. Get help right away if you faint or have chest pain, shortness of breath, a severe headache, or dizziness. This information is not intended to replace advice given to you by your health care provider. Make sure you discuss any questions you have with your healthcare provider. Document Revised: 06/07/2017 Document Reviewed:  06/07/2017 Elsevier Patient Education  2022 [employer].

## 2020-11-04 NOTE — Progress Notes (Signed)
Subjective:  Patient ID: Peggy Church, female    DOB: 1956-01-25  Age: 65 y.o. MRN: [id number]  CC:  Chief Complaint  Patient presents with   Hypertension    Needing refill on amlodipine    Health Maintenance    declined mammogram and colonoscopy     HPI [name] presents for   Hypertension: Increased stress with moving, renting room from kids, now having to rent a room. Still taking amlodipine 36m qd. No missed doses. No new swelling on amlodipine.  Infrequent potassium supplement when heart racing (notices every few weeks, fast heartrate feeling but HR is ok, notes when stressed, no CP/dyspnea/near syncope, lasts for 1 hour, resolves with taking potassium pill).  Caffeine - 2 cups coffee per day.  No alcohol usually.  Home readings:none.  No recent labs  - none since 2020.  Lab Results  Component Value Date   K 3.7 11/04/2020    BP Readings from Last 3 Encounters:  11/04/20 134/82  07/19/19 135/72  06/20/19 138/90   Lab Results  Component Value Date   CREATININE 0.85 11/04/2020   Hyperlipidemia: Has tried multiple statins in past, muscle aches. Did not tolerate niacin. Does not want to meet with cardiology about pcsk9 inhibitor at this time - wants to get settled first.  Lab Results  Component Value Date   CHOL 340 (H) 11/04/2020   HDL 58.20 11/04/2020   LDLCALC 279 (H) 04/03/2019   LDLDIRECT 247.0 11/04/2020   TRIG 280.0 (H) 11/04/2020   CHOLHDL 6 11/04/2020   Lab Results  Component Value Date   ALT 10 11/04/2020   AST 13 11/04/2020   ALKPHOS 105 11/04/2020   BILITOT 0.9 11/04/2020   Stress, anxiety Past 1 year. Working for school. Teachers quitting. Assistants quitting.  Daughter recently separated - past year. Had been living at her house, but with split, now has to move. Financial stress with rent.  No problems affording medication.  Denies food insecurity.  More anxiety/stress for 1 year.  Playing on phone helps with stress.  Trying to  stay busy - 2 jobs, but less work at [employer] the summer. Has not met with therapist.  Some difficulty with sleep at times with worry. Does not want to take daily med.  No SI/HI.        History Patient Active Problem List   Diagnosis Date Noted   BMI 31.0-31.9,adult 11/05/2015   HTN (hypertension) 06/26/2012   Past Medical History:  Diagnosis Date   Allergy    Hypertension    Migraine    Past Surgical History:  Procedure Laterality Date   BLADDER SURGERY  1963   stretched   DENTAL SURGERY     ECTOPIC PREGNANCY SURGERY     TONSILLECTOMY AND ADENOIDECTOMY     TUBAL LIGATION     Allergies  Allergen Reactions   Benicar [Olmesartan]     Smells and senses things that aren't really there.   Lisinopril     cough   Sulfa Drugs Cross Reactors    Prior to Admission medications   Medication Sig Start Date End Date Taking? Authorizing Provider  acyclovir (ZOVIRAX) 200 MG capsule Take 4 capsules (800 mg total) by mouth 2 (two) times daily. For 5 days with outbreak. 04/03/19  Yes GWendie Agreste MD  amLODipine (NORVASC) 5 MG tablet TAKE 1 TABLET (5 MG TOTAL) BY MOUTH DAILY. APPOINTMENT REQUIRED FOR FUTURE REFILLS. 10/08/20 10/08/21 Yes GWendie Agreste MD  Multiple Vitamin (MULTI-VITAMINS) TABS Take  by mouth daily.   Yes [provider]  potassium chloride SA (KLOR-CON) 20 MEQ tablet TAKE 1 TABLET BY MOUTH TWICE DAILY (NEED TO SCHEDULE 6 MONTH APPT IN MAY) 08/02/19  Yes [name], MD   Social History   Socioeconomic History   Marital status: Divorced    Spouse name: n/a   Number of children: 3   Years of education: college   Highest education level: Not on file  Occupational History   Occupation: teaching assistant    Comment: [employer]  Tobacco Use   Smoking status: Former    Pack years: 0.00    Types: Cigarettes    Quit date: 07/18/1991    Years since quitting: 29.3   Smokeless tobacco: Never  Substance and Sexual Activity   Alcohol  use: Yes    Alcohol/week: 0.0 standard drinks    Comment: rarely   Drug use: No   Sexual activity: Not on file  Other Topics Concern   Not on file  Social History Narrative   Lives with her daughter and fiance.   Right handed   Lives in 2 story home.   Social Determinants of Health   Financial Resource Strain: Not on file  Food Insecurity: Not on file  Transportation Needs: Not on file  Physical Activity: Not on file  Stress: Not on file  Social Connections: Not on file  Intimate Partner Violence: Not on file    Review of Systems Per HPI.   Objective:   Vitals:   11/04/20 1050 11/04/20 1159  BP: (!) 146/92 134/82  Pulse: 73   Temp: 98 F (36.7 C)   TempSrc: Temporal   SpO2: 97%   Weight: 198 lb 6.4 oz (90 kg)      Physical Exam Vitals reviewed.  Constitutional:      Appearance: Normal appearance. She is well-developed.  HENT:     Head: Normocephalic and atraumatic.  Eyes:     Conjunctiva/sclera: Conjunctivae normal.     Pupils: Pupils are equal, round, and reactive to light.  Neck:     Vascular: No carotid bruit.  Cardiovascular:     Rate and Rhythm: Normal rate and regular rhythm.     Heart sounds: Normal heart sounds.  Pulmonary:     Effort: Pulmonary effort is normal.     Breath sounds: Normal breath sounds.  Abdominal:     Palpations: Abdomen is soft. There is no pulsatile mass.     Tenderness: There is no abdominal tenderness.  Musculoskeletal:     Right lower leg: No edema.     Left lower leg: No edema.  Skin:    General: Skin is warm and dry.  Neurological:     Mental Status: She is alert and oriented to person, place, and time.  Psychiatric:        Mood and Affect: Mood normal.        Behavior: Behavior normal.        Thought Content: Thought content normal.        Judgment: Judgment normal.   EKG:   Sinus rhythm, no acute findings.  No apparent significant changes from 11/04/2013.   Assessment & Plan:  [name] is a 65 y.o.  female . Essential hypertension - Plan: Comprehensive metabolic panel, CBC, EKG 89-FYBO, amLODipine (NORVASC) 5 MG tablet  -Some persistent elevated readings, improved on recheck but still above goal.  Trial of amlodipine 7.5 mg dosing with RTC precautions.  Recheck 6 weeks  Situational  stress - Plan: hydrOXYzine (ATARAX/VISTARIL) 10 MG tablet  -Handout given on anxiety management, option of counseling discussed including EAP through work initially or other phone numbers provided.  Hydroxyzine if needed for anxiety or sleep with potential side effects discussed.  Recheck 6 weeks  Palpitations - Plan: CBC, TSH, EKG 12-Lead  -Fleeting symptoms with apparently normal heart rate.  Possible anxiety component.  No concerning findings on EKG.  Discussed cardiology follow-up/eval if persistent or recurrent symptoms.  Check TSH, CBC.  Hyperlipidemia, unspecified hyperlipidemia type - Plan: Comprehensive metabolic panel, Lipid panel  -Repeat labs, but if intolerance to statin, would consider PCSK9 inhibitor/lipid clinic evaluation.  Anxiety - Plan: hydrOXYzine (ATARAX/VISTARIL) 10 MG tablet  -As above for situational stress  Meds ordered this encounter  Medications   hydrOXYzine (ATARAX/VISTARIL) 10 MG tablet    Sig: Take 1 tablet (10 mg total) by mouth 3 (three) times daily as needed (for anxiety or sleep).    Dispense:  30 tablet    Refill:  0   amLODipine (NORVASC) 5 MG tablet    Sig: Take 1.5 tablets (7.5 mg total) by mouth daily.    Dispense:  135 tablet    Refill:  1   Patient Instructions  I will check labs, but if heart palpitations return, I do want you to meet with cardiology.  I would also recommend meeting with lipid specialist about different cholesterol medicine you may be able to tolerate better. Let me know when I can refer you.   Can try higher dose of amlodipine at 1.5 pills per day. If more leg swelling or new side effects let me know. .   I do recommend meeting with a  therapist.  See if an EAP program is in place with your work. If not, here are some numbers.   Here are a few options for counseling:  [facility]:  [place] [phone]   Hydroxyzine can be taken for sleep or if you are having more anxiety symptoms.   Return to the clinic or go to the nearest emergency room if any of your symptoms worsen or new symptoms occur.  Managing Anxiety, Adult After being diagnosed with an anxiety disorder, you may be relieved to know why you have felt or behaved a certain way. You may also feel overwhelmed about the treatment ahead and what it will mean for your life. With care and support, youcan manage this condition and recover from it. How to manage lifestyle changes Managing stress and anxiety  Stress is your body's reaction to life changes and events, both good and bad. Most stress will last just a few hours, but stress can be ongoing and can lead to more than just stress. Although stress can play a major role in anxiety, it is not the same as anxiety. Stress is usually caused by something external, such as a deadline, test, or competition. Stress normally passes after thetriggering event has ended.  Anxiety is caused by something internal, such as imagining a terrible outcome or worrying that something will go wrong that will devastate you. Anxiety often does not go away even after the triggering event is over, and it can become long-term (chronic) worry. It is important to understand the differences between stress and anxiety and to manage your stress effectively so that it does not lead to ananxious response. Talk with your health care provider or a counselor to learn more about reducing anxiety and stress. He or she may suggest tension  reduction techniques, such as: Music therapy. This can include creating or listening to music that you enjoy and that inspires you. Mindfulness-based meditation.  This involves being aware of your normal breaths while not trying to control your breathing. It can be done while sitting or walking. Centering prayer. This involves focusing on a word, phrase, or sacred image that means something to you and brings you peace. Deep breathing. To do this, expand your stomach and inhale slowly through your nose. Hold your breath for 3-5 seconds. Then exhale slowly, letting your stomach muscles relax. Self-talk. This involves identifying thought patterns that lead to anxiety reactions and changing those patterns. Muscle relaxation. This involves tensing muscles and then relaxing them. Choose a tension reduction technique that suits your lifestyle and personality. These techniques take time and practice. Set aside 5-15 minutes a day to do them. Therapists can offer counseling and training in these techniques. The training to help with anxiety may be covered by some insurance plans. Other things you can do to manage stress and anxiety include: Keeping a stress/anxiety diary. This can help you learn what triggers your reaction and then learn ways to manage your response. Thinking about how you react to certain situations. You may not be able to control everything, but you can control your response. Making time for activities that help you relax and not feeling guilty about spending your time in this way. Visual imagery and yoga can help you stay calm and relax.  Medicines Medicines can help ease symptoms. Medicines for anxiety include: Anti-anxiety drugs. Antidepressants. Medicines are often used as a primary treatment for anxiety disorder. Medicines will be prescribed by a health care provider. When used together, medicines, psychotherapy, and tension reduction techniques may be the most effectivetreatment. Relationships Relationships can play a big part in helping you recover. Try to spend more time connecting with trusted friends and family members. Consider going to  couples counseling, taking family education classes, or going to familytherapy. Therapy can help you and others better understand your condition. How to recognize changes in your anxiety Everyone responds differently to treatment for anxiety. Recovery from anxiety happens when symptoms decrease and stop interfering with your daily activities at home or work. This may mean that you will start to: Have better concentration and focus. Worry will interfere less in your daily thinking. Sleep better. Be less irritable. Have more energy. Have improved memory. It is important to recognize when your condition is getting worse. Contact your health care provider if your symptoms interfere with home or work and you feellike your condition is not improving. Follow these instructions at home: Activity Exercise. Most adults should do the following: Exercise for at least 150 minutes each week. The exercise should increase your heart rate and make you sweat (moderate-intensity exercise). Strengthening exercises at least twice a week. Get the right amount and quality of sleep. Most adults need 7-9 hours of sleep each night. Lifestyle  Eat a healthy diet that includes plenty of vegetables, fruits, whole grains, low-fat dairy products, and lean protein. Do not eat a lot of foods that are high in solid fats, added sugars, or salt. Make choices that simplify your life. Do not use any products that contain nicotine or tobacco, such as cigarettes, e-cigarettes, and chewing tobacco. If you need help quitting, ask your health care provider. Avoid caffeine, alcohol, and certain over-the-counter cold medicines. These may make you feel worse. Ask your pharmacist which medicines to avoid.  General instructions Take over-the-counter and prescription  medicines only as told by your health care provider. Keep all follow-up visits as told by your health care provider. This is important. Where to find support You can get help  and support from these sources: Self-help groups. Online and [employer]. A trusted spiritual leader. Couples counseling. Family education classes. Family therapy. Where to find more information You may find that joining a support group helps you deal with your anxiety. The following sources can help you locate counselors or support groups near you: [place]: www.mentalhealth[place].net Anxiety and Depression Association of [place] (ADAA): https://www.clark.net/ National Alliance on Mental Illness (NAMI): www.nami.org Contact a health care provider if you: Have a hard time staying focused or finishing daily tasks. Spend many hours a day feeling worried about everyday life. Become exhausted by worry. Start to have headaches, feel tense, or have nausea. Urinate more than normal. Have diarrhea. Get help right away if you have: A racing heart and shortness of breath. Thoughts of hurting yourself or others. If you ever feel like you may hurt yourself or others, or have thoughts about taking your own life, get help right away. You can go to your nearest emergency department or call: Your local emergency services (911 in the [place] A suicide crisis helpline, such as the [place] at 724-456-2754. This is open 24 hours a day. Summary Taking steps to learn and use tension reduction techniques can help calm you and help prevent triggering an anxiety reaction. When used together, medicines, psychotherapy, and tension reduction techniques may be the most effective treatment. Family, friends, and partners can play a big part in helping you recover from an anxiety disorder. This information is not intended to replace advice given to you by your health care provider. Make sure you discuss any questions you have with your healthcare provider. Document Revised: 09/25/2018 Document Reviewed: 09/25/2018 Elsevier Patient Education  [place].   Palpitations Palpitations are feelings that your heartbeat is irregular or is faster than normal. It may feel like your heart is fluttering or skipping a beat. Palpitations are usually not a serious problem. They may be caused by many things, including smoking, caffeine, alcohol, stress, and certain medicines or drugs. Most causes of palpitations are not serious. However, some palpitations can be a sign of a serious problem. You may need further tests to rule outserious medical problems. Follow these instructions at home:     Pay attention to any changes in your condition. Take these actions to helpmanage your symptoms: Eating and drinking Avoid foods and drinks that may cause palpitations. These may include: Caffeinated coffee, tea, soft drinks, diet pills, and energy drinks. Chocolate. Alcohol. Lifestyle Take steps to reduce your stress and anxiety. Things that can help you relax include: Yoga. Mind-body activities, such as deep breathing, meditation, or using words and images to create positive thoughts (guided imagery). Physical activity, such as swimming, jogging, or walking. Tell your health care provider if your palpitations increase with activity. If you have chest pain or shortness of breath with activity, do not continue the activity until you are seen by your health care provider. Biofeedback. This is a method that helps you learn to use your mind to control things in your body, such as your heartbeat. Do not use drugs, including cocaine or ecstasy. Do not use marijuana. Get plenty of rest and sleep. Keep a regular bed time. General instructions Take over-the-counter and prescription medicines only as told by your health care provider. Do not use  any products that contain nicotine or tobacco, such as cigarettes and e-cigarettes. If you need help quitting, ask your health care provider. Keep all follow-up visits as told by your health care provider. This is important. These  may include visits for further testing if palpitations do not go away or get worse. Contact a health care provider if you: Continue to have a fast or irregular heartbeat after 24 hours. Notice that your palpitations occur more often. Get help right away if you: Have chest pain or shortness of breath. Have a severe headache. Feel dizzy or you faint. Summary Palpitations are feelings that your heartbeat is irregular or is faster than normal. It may feel like your heart is fluttering or skipping a beat. Palpitations may be caused by many things, including smoking, caffeine, alcohol, stress, certain medicines, and drugs. Although most causes of palpitations are not serious, some causes can be a sign of a serious medical problem. Get help right away if you faint or have chest pain, shortness of breath, a severe headache, or dizziness. This information is not intended to replace advice given to you by your health care provider. Make sure you discuss any questions you have with your healthcare provider. Document Revised: 06/07/2017 Document Reviewed: 06/07/2017 Elsevier Patient Education  2022 [place],   Merri Ray, MD [place] 11/04/20 8:54 PM

## 2020-11-30 ENCOUNTER — Other Ambulatory Visit (HOSPITAL_COMMUNITY): Payer: Self-pay

## 2020-12-16 ENCOUNTER — Ambulatory Visit: Payer: BC Managed Care – PPO | Admitting: Family Medicine

## 2020-12-16 ENCOUNTER — Other Ambulatory Visit: Payer: Self-pay

## 2020-12-16 ENCOUNTER — Encounter: Payer: Self-pay | Admitting: Family Medicine

## 2020-12-16 VITALS — BP 128/72 | HR 78 | Temp 98.2°F | Resp 15 | Ht 66.0 in | Wt 196.0 lb

## 2020-12-16 DIAGNOSIS — F439 Reaction to severe stress, unspecified: Secondary | ICD-10-CM

## 2020-12-16 DIAGNOSIS — I10 Essential (primary) hypertension: Secondary | ICD-10-CM | POA: Diagnosis not present

## 2020-12-16 DIAGNOSIS — F419 Anxiety disorder, unspecified: Secondary | ICD-10-CM

## 2020-12-16 DIAGNOSIS — E785 Hyperlipidemia, unspecified: Secondary | ICD-10-CM | POA: Diagnosis not present

## 2020-12-16 NOTE — Progress Notes (Signed)
Subjective:  Patient ID: Peggy Church, female    DOB: [birth year]  Age: 65 y.o. MRN: [id number]  CC:  Chief Complaint  Patient presents with   Palpitations    No action needed, pt reported palpitations last visit pt notes subsided once she moved and is feeling better   Hypertension    Pt here for recheck on BP today    Anxiety    Pt due for recheck today doing well since moving     HPI Miquel Oveson presents for   Hypertension: Last discussed in June.  Amlodipine 5 mg daily, some increased stress with moving and having to rent a room, intermittent palpitations with fast heart rate feeling every few weeks at that time, improved now with less stress.CBC, TSH, EKG overall reassuring at last visit. Slight elevated hemoglobin of 15.5. Some elevated readings persistently last visit, increased amlodipine to 7.5 mg daily. Doing ok on higher dose. No new side effects. Home readings: none recent. Just moved.  BP Readings from Last 3 Encounters:  12/16/20 128/72  11/04/20 134/82  07/19/19 135/72   Lab Results  Component Value Date   CREATININE 0.85 11/04/2020   Hyperlipidemia: Possible familial hyperlipidemia with LDL of 247 in June.  She has tried multiple statins in the past with muscle aches, discussed meeting with cardiology about PCSK9 inhibitor but deferred at her last visit. Lab Results  Component Value Date   CHOL 340 (H) 11/04/2020   HDL 58.20 11/04/2020   LDLCALC 279 (H) 04/03/2019   LDLDIRECT 247.0 11/04/2020   TRIG 280.0 (H) 11/04/2020   CHOLHDL 6 11/04/2020   Lab Results  Component Value Date   ALT 10 11/04/2020   AST 13 11/04/2020   ALKPHOS 105 11/04/2020   BILITOT 0.9 11/04/2020   Situational stress/anxiety Stressed with work, family discussed last visit.  Some financial stress with rent.  Seem to have more stress over the past 1 year.  Some difficulty with sleep at times with worry at last visit but she did not want to take any daily medication.  Hydroxyzine  prescribed as needed for sleep or anxiety, handout given on stress management, phone numbers provided for therapist.  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  Sleeping better. Has not needed hydroxyzine. More settled since last visit. Things are going better since last visit. Working on testing for new job. Staying occupied. Busy, focused.    History Patient Active Problem List   Diagnosis Date Noted   BMI 31.0-31.9,adult 11/05/2015   HTN (hypertension) 06/26/2012   Past Medical History:  Diagnosis Date   Allergy    Hypertension    Migraine    Past Surgical History:  Procedure Laterality Date   BLADDER SURGERY  1963   stretched   DENTAL SURGERY     ECTOPIC PREGNANCY SURGERY     TONSILLECTOMY AND ADENOIDECTOMY     TUBAL LIGATION     Allergies  Allergen Reactions   Benicar [Olmesartan]     Smells and senses things that aren't really there.   Lisinopril     cough   Sulfa Drugs Cross Reactors    Prior to Admission medications   Medication Sig Start Date End Date Taking? Authorizing Provider  acyclovir (ZOVIRAX) 200 MG capsule Take 4 capsules (800 mg total) by mouth 2 (two) times daily. For 5 days with outbreak. 04/03/19  Yes [name], MD  amLODipine (NORVASC) 5 MG tablet Take 1.5 tablets (7.5 mg total) by  mouth daily. 11/04/20  Yes [name], MD  hydrOXYzine (ATARAX/VISTARIL) 10 MG tablet Take 1 tablet (10 mg total) by mouth 3 (three) times daily as needed (for anxiety or sleep). 11/04/20  Yes [name], MD  Multiple Vitamin (MULTI-VITAMINS) TABS Take by mouth daily.   Yes [provider]  potassium chloride SA (KLOR-CON) 20 MEQ tablet TAKE 1 TABLET BY MOUTH TWICE DAILY (NEED TO SCHEDULE 6 MONTH APPT IN MAY) 08/02/19  Yes [name], MD   Social History   Socioeconomic History   Marital status: Divorced    Spouse name: n/a   Number of children: 3   Years of education: college   Highest  education level: Not on file  Occupational History   Occupation: teaching assistant    Comment: [place]  Tobacco Use   Smoking status: Former    Types: Cigarettes    Quit date: 07/18/1991    Years since quitting: 29.4   Smokeless tobacco: Never  Substance and Sexual Activity   Alcohol use: Yes    Alcohol/week: 0.0 standard drinks    Comment: rarely   Drug use: No   Sexual activity: Not on file  Other Topics Concern   Not on file  Social History Narrative   Lives with her daughter and fiance.   Right handed   Lives in 2 story home.   Social Determinants of Health   Financial Resource Strain: Not on file  Food Insecurity: Not on file  Transportation Needs: Not on file  Physical Activity: Not on file  Stress: Not on file  Social Connections: Not on file  Intimate Partner Violence: Not on file    Review of Systems  Constitutional:  Negative for fatigue and unexpected weight change.  Respiratory:  Negative for chest tightness and shortness of breath.   Cardiovascular:  Negative for chest pain, palpitations (decreased) and leg swelling.  Gastrointestinal:  Negative for abdominal pain and blood in stool.  Neurological:  Negative for dizziness, syncope, light-headedness and headaches.    Objective:   Vitals:   12/16/20 1041  BP: 128/72  Pulse: 78  Resp: 15  Temp: 98.2 F (36.8 C)  TempSrc: Temporal  SpO2: 98%  Weight: 196 lb (88.9 kg)  Height: '5\' 6"'$  (1.676 m)     Physical Exam Vitals reviewed.  Constitutional:      Appearance: Normal appearance. She is well-developed.  HENT:     Head: Normocephalic and atraumatic.  Eyes:     Conjunctiva/sclera: Conjunctivae normal.     Pupils: Pupils are equal, round, and reactive to light.  Neck:     Vascular: No carotid bruit.  Cardiovascular:     Rate and Rhythm: Normal rate and regular rhythm.     Heart sounds: Normal heart sounds.  Pulmonary:     Effort: Pulmonary effort is normal.     Breath sounds:  Normal breath sounds.  Abdominal:     Palpations: Abdomen is soft. There is no pulsatile mass.     Tenderness: There is no abdominal tenderness.  Musculoskeletal:     Right lower leg: No edema.     Left lower leg: No edema.  Skin:    General: Skin is warm and dry.  Neurological:     Mental Status: She is alert and oriented to person, place, and time.  Psychiatric:        Mood and Affect: Mood normal.        Behavior: Behavior normal.  Assessment & Plan:  Rexann Marchesani is a 65 y.o. female . Hyperlipidemia, unspecified hyperlipidemia type  Essential hypertension  Situational stress  Anxiety  Improving since last visit with less situational stressors, improved palpitations.  Tolerating current dose of amlodipine with improved reading in office.  Sleeping better, has not needed hydroxyzine but has at home as well as handouts on stress management.  Continue same regimen with repeat labs in 4 months.  Discussed hyperlipidemia and recommendation for lipid clinic to evaluate for other options as intolerant to statins.  With changing jobs, would like to defer that at this time, but can refer her whenever she is ready.  Recheck for labs in 4 months, borderline hemoglobin previously, but has been in similar range in the past.  Possible volume component.  RTC precautions.  No orders of the defined types were placed in this encounter.  Patient Instructions  I am glad to hear that things are better.  Same dose of amlodipine for now.  Hydroxyzine if needed.  Let me know when I can refer you to lipid specialist to look at options for your high cholesterol.   Follow up for repeat labs in 4 months.  Let me know if any questions sooner.     Signed,   Merri Ray, MD [place] 12/16/20 11:40 AM

## 2020-12-16 NOTE — Patient Instructions (Addendum)
I am glad to hear that things are better.  Same dose of amlodipine for now.  Hydroxyzine if needed.  Let me know when I can refer you to lipid specialist to look at options for your high cholesterol.   Follow up for repeat labs in 4 months.  Let me know if any questions sooner.

## 2021-02-03 ENCOUNTER — Other Ambulatory Visit (HOSPITAL_BASED_OUTPATIENT_CLINIC_OR_DEPARTMENT_OTHER): Payer: Self-pay

## 2021-03-10 ENCOUNTER — Other Ambulatory Visit (HOSPITAL_BASED_OUTPATIENT_CLINIC_OR_DEPARTMENT_OTHER): Payer: Self-pay

## 2021-04-23 ENCOUNTER — Other Ambulatory Visit (HOSPITAL_BASED_OUTPATIENT_CLINIC_OR_DEPARTMENT_OTHER): Payer: Self-pay

## 2021-04-27 ENCOUNTER — Other Ambulatory Visit (HOSPITAL_BASED_OUTPATIENT_CLINIC_OR_DEPARTMENT_OTHER): Payer: Self-pay

## 2021-04-27 MED ORDER — PREDNISONE 5 MG PO TABS
ORAL_TABLET | ORAL | 0 refills | Status: DC
  Filled 2021-04-27: qty 40, 5d supply, fill #0

## 2021-05-28 ENCOUNTER — Other Ambulatory Visit (HOSPITAL_BASED_OUTPATIENT_CLINIC_OR_DEPARTMENT_OTHER): Payer: Self-pay

## 2021-06-29 ENCOUNTER — Other Ambulatory Visit (HOSPITAL_BASED_OUTPATIENT_CLINIC_OR_DEPARTMENT_OTHER): Payer: Self-pay

## 2021-06-29 ENCOUNTER — Other Ambulatory Visit: Payer: Self-pay | Admitting: Family Medicine

## 2021-06-29 DIAGNOSIS — I10 Essential (primary) hypertension: Secondary | ICD-10-CM

## 2021-06-29 MED ORDER — AMLODIPINE BESYLATE 5 MG PO TABS
7.5000 mg | ORAL_TABLET | Freq: Every day | ORAL | 1 refills | Status: DC
  Filled 2021-06-29: qty 45, 30d supply, fill #0
  Filled 2021-08-04: qty 45, 30d supply, fill #1
  Filled 2021-08-06: qty 45, 30d supply, fill #2

## 2021-08-04 ENCOUNTER — Other Ambulatory Visit (HOSPITAL_BASED_OUTPATIENT_CLINIC_OR_DEPARTMENT_OTHER): Payer: Self-pay

## 2021-08-06 ENCOUNTER — Other Ambulatory Visit (HOSPITAL_COMMUNITY): Payer: Self-pay

## 2021-08-06 ENCOUNTER — Encounter: Payer: Self-pay | Admitting: Family Medicine

## 2021-08-06 ENCOUNTER — Other Ambulatory Visit (HOSPITAL_BASED_OUTPATIENT_CLINIC_OR_DEPARTMENT_OTHER): Payer: Self-pay

## 2021-08-06 DIAGNOSIS — I10 Essential (primary) hypertension: Secondary | ICD-10-CM

## 2021-08-06 MED ORDER — AMLODIPINE BESYLATE 5 MG PO TABS
7.5000 mg | ORAL_TABLET | Freq: Every day | ORAL | 1 refills | Status: DC
  Filled 2021-08-06: qty 135, 90d supply, fill #0

## 2021-08-12 ENCOUNTER — Ambulatory Visit: Payer: Managed Care, Other (non HMO) | Admitting: Family Medicine

## 2021-08-23 ENCOUNTER — Other Ambulatory Visit (HOSPITAL_COMMUNITY): Payer: Self-pay

## 2021-08-23 ENCOUNTER — Ambulatory Visit (INDEPENDENT_AMBULATORY_CARE_PROVIDER_SITE_OTHER): Payer: Managed Care, Other (non HMO) | Admitting: Family Medicine

## 2021-08-23 VITALS — BP 128/78 | HR 67 | Temp 98.0°F | Resp 17 | Ht 66.0 in | Wt 193.6 lb

## 2021-08-23 DIAGNOSIS — R1310 Dysphagia, unspecified: Secondary | ICD-10-CM

## 2021-08-23 DIAGNOSIS — E785 Hyperlipidemia, unspecified: Secondary | ICD-10-CM | POA: Diagnosis not present

## 2021-08-23 DIAGNOSIS — I10 Essential (primary) hypertension: Secondary | ICD-10-CM | POA: Diagnosis not present

## 2021-08-23 DIAGNOSIS — Z1211 Encounter for screening for malignant neoplasm of colon: Secondary | ICD-10-CM | POA: Diagnosis not present

## 2021-08-23 MED ORDER — AMLODIPINE BESYLATE 5 MG PO TABS
7.5000 mg | ORAL_TABLET | Freq: Every day | ORAL | 1 refills | Status: DC
  Filled 2021-08-23: qty 135, 90d supply, fill #0
  Filled 2021-08-26 – 2021-08-27 (×2): qty 45, 30d supply, fill #0
  Filled 2021-09-27: qty 45, 30d supply, fill #1
  Filled 2021-11-01: qty 45, 30d supply, fill #2
  Filled 2021-12-21: qty 45, 30d supply, fill #3
  Filled 2022-01-13: qty 45, 30d supply, fill #4
  Filled 2022-02-16: qty 45, 30d supply, fill #5

## 2021-08-23 NOTE — Patient Instructions (Addendum)
Continue to eat potassium rich foods (Dried fruits (raisins, apricots), Beans, lentils, Potatoes, Winter squash (acorn, butternut), Spinach, broccoli, Beet greens, Avocado, Bananas. Will stop supplement for now if levels are ok today. Can also add over the counter supplement few days per week.  ? ?I will refer you to lipid clinic.  ? ?Avoid peppers for now. I will refer you to GI for throat symptoms and possible colonoscopy.  ? ?Return to the clinic or go to the nearest emergency room if any of your symptoms worsen or new symptoms occur. ? ? ?Fasting labs at [facility]: ?[facility] ?Walk in 8:30-4:30 during weekdays, no appointment needed ?[place]  ?[place], [place] [id number] ? ? ? ? ? ? ?  ?

## 2021-08-23 NOTE — Progress Notes (Signed)
? ?Subjective:  ?Patient ID: Peggy Church, female    DOB: [birth year]  Age: 66 y.o. MRN: [id number] ? ?CC:  ?Chief Complaint  ?Patient presents with  ? Hypertension  ?  Pt due for refill no concerns, denies physical sxs no concerns today   ? ? ?HPI ?Peggy Church presents for  ? ?Hypertension: ?On norvasc 7.'5mg'$  qd and KCL 48mq  - taking occasionally - makes sick to stomach. Takes once every 2 weeks.  ?No otc potassium supplements.  ?No new side effects.  ?Home readings:none recently ?No hear palpitations. No chest pains.  ?No new leg swelling. Rare if on feet at times, resolves with elevation.  ?BP Readings from Last 3 Encounters:  ?08/23/21 128/78  ?12/16/20 128/72  ?11/04/20 134/82  ? ?Lab Results  ?Component Value Date  ? CREATININE 0.85 11/04/2020  ? ?Hyperlipidemia: ?Intolerant to multiple statins. Myalgias.  ?FH of HLD.  ?Last ate 4 hours ago.  ? ?The ASCVD Risk score (Arnett DK, et al., 2019) failed to calculate for the following reasons: ?  The valid total cholesterol range is 130 to 320 mg/dL ? ?Lab Results  ?Component Value Date  ? CHOL 340 (H) 11/04/2020  ? HDL 58.20 11/04/2020  ? LDLCALC 279 (H) 04/03/2019  ? LDLDIRECT 247.0 11/04/2020  ? TRIG 280.0 (H) 11/04/2020  ? CHOLHDL 6 11/04/2020  ? ?Lab Results  ?Component Value Date  ? ALT 10 11/04/2020  ? AST 13 11/04/2020  ? ALKPHOS 105 11/04/2020  ? BILITOT 0.9 11/04/2020  ? ?Dysphagia: ?Notices with rice, peppers - feels like throat closes with peppers. feels stuck, has to regurgitate rice. Noticed since last summer. No liquid dysphagia.  ?No heartburn. No weight loss, night sweats, or fever. ? ? ?HM: ?Agrees to referral for GI - possible EGD and colonoscopy.  ?Mammogram - defers for now.  ?Declines pneumovax.  ?Declines bone density testing.  ?Declines covid and shingrix vaccine.  ? ?Immunization History  ?Administered Date(s) Administered  ? Tdap 05/09/2013  ? ? ? ?History ?Patient Active Problem List  ? Diagnosis Date Noted  ? BMI 31.0-31.9,adult  11/05/2015  ? HTN (hypertension) 06/26/2012  ? ?Past Medical History:  ?Diagnosis Date  ? Allergy   ? Hypertension   ? Migraine   ? ?Past Surgical History:  ?Procedure Laterality Date  ? [place] ? stretched  ? DENTAL SURGERY    ? ECTOPIC PREGNANCY SURGERY    ? TONSILLECTOMY AND ADENOIDECTOMY    ? TUBAL LIGATION    ? ?Allergies  ?Allergen Reactions  ? Benicar [Olmesartan]   ?  Smells and senses things that aren't really there.  ? Lisinopril   ?  cough  ? Statins Other (See Comments)  ?  Myalgia, muscle weakness - lipitor.   ? Sulfa Drugs Cross Reactors   ? ?Prior to Admission medications   ?Medication Sig Start Date End Date Taking? Authorizing Provider  ?acyclovir (ZOVIRAX) 200 MG capsule Take 4 capsules (800 mg total) by mouth 2 (two) times daily. For 5 days with outbreak. 04/03/19  Yes GWendie Agreste MD  ?amLODipine (NORVASC) 5 MG tablet Take 1 & 1/2 tablet (7.5 mg total) by mouth daily. 08/06/21  Yes GWendie Agreste MD  ?Multiple Vitamin (MULTI-VITAMINS) TABS Take by mouth daily.   Yes [provider]  ?potassium chloride SA (KLOR-CON) 20 MEQ tablet TAKE 1 TABLET BY MOUTH TWICE DAILY (NEED TO SCHEDULE 6 MONTH APPT IN MAY) 08/02/19  Yes GWendie Agreste MD  ? ?Social  History  ? ?Socioeconomic History  ? Marital status: Divorced  ?  Spouse name: n/a  ? Number of children: 3  ? Years of education: college  ? Highest education level: Not on file  ?Occupational History  ? Occupation: Consulting civil engineer  ?  Comment: [employer]  ?Tobacco Use  ? Smoking status: Former  ?  Types: Cigarettes  ?  Quit date: 07/18/1991  ?  Years since quitting: 30.1  ? Smokeless tobacco: Never  ?Substance and Sexual Activity  ? Alcohol use: Yes  ?  Alcohol/week: 0.0 standard drinks  ?  Comment: rarely  ? Drug use: No  ? Sexual activity: Not on file  ?Other Topics Concern  ? Not on file  ?Social History Narrative  ? Lives with her daughter and fiance.  ? Right handed  ? Lives in 2 story home.  ? ?Social  Determinants of Health  ? ?Financial Resource Strain: Not on file  ?Food Insecurity: Not on file  ?Transportation Needs: Not on file  ?Physical Activity: Not on file  ?Stress: Not on file  ?Social Connections: Not on file  ?Intimate Partner Violence: Not on file  ? ? ?Review of Systems  ?Constitutional:  Negative for fatigue and unexpected weight change.  ?Respiratory:  Negative for chest tightness and shortness of breath.   ?Cardiovascular:  Negative for chest pain, palpitations and leg swelling.  ?Gastrointestinal:  Negative for abdominal pain and blood in stool.  ?Neurological:  Negative for dizziness, syncope, light-headedness and headaches.  ? ? ?Objective:  ? ?Vitals:  ? 08/23/21 1555  ?BP: 128/78  ?Pulse: 67  ?Resp: 17  ?Temp: 98 ?F (36.7 ?C)  ?TempSrc: Temporal  ?SpO2: 97%  ?Weight: 193 lb 9.6 oz (87.8 kg)  ?Height: '5\' 6"'$  (1.676 m)  ? ? ? ?Physical Exam ?Vitals reviewed.  ?Constitutional:   ?   Appearance: Normal appearance. She is well-developed.  ?HENT:  ?   Head: Normocephalic and atraumatic.  ?Eyes:  ?   Conjunctiva/sclera: Conjunctivae normal.  ?   Pupils: Pupils are equal, round, and reactive to light.  ?Neck:  ?   Vascular: No carotid bruit.  ?Cardiovascular:  ?   Rate and Rhythm: Normal rate and regular rhythm.  ?   Heart sounds: Normal heart sounds.  ?Pulmonary:  ?   Effort: Pulmonary effort is normal.  ?   Breath sounds: Normal breath sounds.  ?Abdominal:  ?   Palpations: Abdomen is soft. There is no pulsatile mass.  ?   Tenderness: There is no abdominal tenderness.  ?Musculoskeletal:  ?   Right lower leg: No edema.  ?   Left lower leg: No edema.  ?Skin: ?   General: Skin is warm and dry.  ?Neurological:  ?   Mental Status: She is alert and oriented to person, place, and time.  ?Psychiatric:     ?   Mood and Affect: Mood normal.     ?   Behavior: Behavior normal.  ? ? ? ? ? ?Assessment & Plan:  ?Peggy Church is a 66 y.o. female . ?Hyperlipidemia, unspecified hyperlipidemia type - Plan: AMB  Referral to [facility], Comprehensive metabolic panel, Lipid panel ? -Suspected familial hyperlipidemia.  Intolerant to statin.  Refer to advanced lipid clinic is slightly candidate for PCSK9 inhibitor.   ? ?Essential hypertension - Plan: amLODipine (NORVASC) 5 MG tablet ? -Stable on 7.5 mg amlodipine dosing.  Dosing options discussed, chose to continue same regimen.  Check labs, fasting lab visit ordered. ? ?  Dysphagia, unspecified type - Plan: Ambulatory referral to Gastroenterology ? -Dysphagia with rice, possibly peppers versus allergy. ?Allergy testing/referral at this time.  Avoidance of rice and peppers, refer to GI to evaluate, possible EGD. ? ?Screen for colon cancer - Plan: Ambulatory referral to Gastroenterology ?Refer to GI for screening colonoscopy. ? ?Meds ordered this encounter  ?Medications  ? amLODipine (NORVASC) 5 MG tablet  ?  Sig: Take 1 & 1/2 tablet (7.5 mg total) by mouth daily.  ?  Dispense:  135 tablet  ?  Refill:  1  ? ?Patient Instructions  ?Continue to eat potassium rich foods (Dried fruits (raisins, apricots), Beans, lentils, Potatoes, Winter squash (acorn, butternut), Spinach, broccoli, Beet greens, Avocado, Bananas. Will stop supplement for now if levels are ok today. Can also add over the counter supplement few days per week.  ? ?I will refer you to lipid clinic.  ? ?Avoid peppers for now. I will refer you to GI for throat symptoms and possible colonoscopy.  ? ?Return to the clinic or go to the nearest emergency room if any of your symptoms worsen or new symptoms occur. ? ? ?Fasting labs at [facility]: ?[facility] ?Walk in 8:30-4:30 during weekdays, no appointment needed ?[address]  ?[place], [place] [address] ? ? ? ? ? ? ?  ? ? ? ?Signed,  ? ?Merri Ray, MD ?[place], [facility] ?[facility] ?08/23/21 ?5:00 PM ? ? ?

## 2021-08-25 ENCOUNTER — Other Ambulatory Visit (HOSPITAL_BASED_OUTPATIENT_CLINIC_OR_DEPARTMENT_OTHER): Payer: Self-pay

## 2021-08-26 ENCOUNTER — Other Ambulatory Visit (HOSPITAL_BASED_OUTPATIENT_CLINIC_OR_DEPARTMENT_OTHER): Payer: Self-pay

## 2021-08-27 ENCOUNTER — Other Ambulatory Visit (HOSPITAL_BASED_OUTPATIENT_CLINIC_OR_DEPARTMENT_OTHER): Payer: Self-pay

## 2021-09-27 ENCOUNTER — Other Ambulatory Visit (HOSPITAL_BASED_OUTPATIENT_CLINIC_OR_DEPARTMENT_OTHER): Payer: Self-pay

## 2021-09-27 MED ORDER — AMOXICILLIN 500 MG PO CAPS
ORAL_CAPSULE | ORAL | 0 refills | Status: DC
  Filled 2021-09-27: qty 21, 7d supply, fill #0

## 2021-10-15 ENCOUNTER — Other Ambulatory Visit (HOSPITAL_COMMUNITY): Payer: Self-pay

## 2021-10-15 ENCOUNTER — Ambulatory Visit (INDEPENDENT_AMBULATORY_CARE_PROVIDER_SITE_OTHER): Payer: Managed Care, Other (non HMO) | Admitting: Family Medicine

## 2021-10-15 ENCOUNTER — Other Ambulatory Visit (HOSPITAL_BASED_OUTPATIENT_CLINIC_OR_DEPARTMENT_OTHER): Payer: Self-pay

## 2021-10-15 ENCOUNTER — Encounter: Payer: Self-pay | Admitting: Family Medicine

## 2021-10-15 VITALS — BP 140/82 | HR 64 | Temp 98.1°F | Resp 19 | Ht 66.0 in | Wt 194.0 lb

## 2021-10-15 DIAGNOSIS — N3001 Acute cystitis with hematuria: Secondary | ICD-10-CM | POA: Diagnosis not present

## 2021-10-15 DIAGNOSIS — B3731 Acute candidiasis of vulva and vagina: Secondary | ICD-10-CM

## 2021-10-15 DIAGNOSIS — R350 Frequency of micturition: Secondary | ICD-10-CM | POA: Diagnosis not present

## 2021-10-15 LAB — POCT URINALYSIS DIP (MANUAL ENTRY)
Bilirubin, UA: NEGATIVE
Glucose, UA: NEGATIVE mg/dL
Ketones, POC UA: NEGATIVE mg/dL
Nitrite, UA: NEGATIVE
Protein Ur, POC: 30 mg/dL — AB
Spec Grav, UA: >=1.03 — AB (ref 1.010–1.025)
Urobilinogen, UA: 0.2 E.U./dL
pH, UA: 7 (ref 5.0–8.0)

## 2021-10-15 MED ORDER — NITROFURANTOIN MONOHYD MACRO 100 MG PO CAPS
100.0000 mg | ORAL_CAPSULE | Freq: Two times a day (BID) | ORAL | 0 refills | Status: DC
  Filled 2021-10-15 (×2): qty 14, 7d supply, fill #0

## 2021-10-15 MED ORDER — FLUCONAZOLE 150 MG PO TABS
150.0000 mg | ORAL_TABLET | Freq: Once | ORAL | 0 refills | Status: AC
  Filled 2021-10-15: qty 2, 7d supply, fill #0
  Filled 2021-10-15: qty 2, 2d supply, fill #0

## 2021-10-15 NOTE — Patient Instructions (Addendum)
I suspect you do have a yeast infection as well as a secondary urinary tract infection.  Start antibiotic Macrodantin twice per day, drink plenty of fluids.  Over-the-counter Azo if needed.  Take Diflucan once today, and repeat in 1 week as unfortunately yeast infection may worsen with restart of antibiotics.  If symptoms are not improving into next week, return for recheck and exam.  Sooner or urgent care if worse.  Hope you feel better soon.   Urinary Tract Infection, Adult  A urinary tract infection (UTI) is an infection of any part of the urinary tract. The urinary tract includes the kidneys, ureters, bladder, and urethra. These organs make, store, and get rid of urine in the body. An upper UTI affects the ureters and kidneys. A lower UTI affects the bladder and urethra. What are the causes? Most urinary tract infections are caused by bacteria in your genital area around your urethra, where urine leaves your body. These bacteria grow and cause inflammation of your urinary tract. What increases the risk? You are more likely to develop this condition if: You have a urinary catheter that stays in place. You are not able to control when you urinate or have a bowel movement (incontinence). You are female and you: Use a spermicide or diaphragm for birth control. Have low estrogen levels. Are pregnant. You have certain genes that increase your risk. You are sexually active. You take antibiotic medicines. You have a condition that causes your flow of urine to slow down, such as: An enlarged prostate, if you are female. Blockage in your urethra. A kidney stone. A nerve condition that affects your bladder control (neurogenic bladder). Not getting enough to drink, or not urinating often. You have certain medical conditions, such as: Diabetes. A weak disease-fighting system (immunesystem). Sickle cell disease. Gout. Spinal cord injury. What are the signs or symptoms? Symptoms of this  condition include: Needing to urinate right away (urgency). Frequent urination. This may include small amounts of urine each time you urinate. Pain or burning with urination. Blood in the urine. Urine that smells bad or unusual. Trouble urinating. Cloudy urine. Vaginal discharge, if you are female. Pain in the abdomen or the lower back. You may also have: Vomiting or a decreased appetite. Confusion. Irritability or tiredness. A fever or chills. Diarrhea. The first symptom in older adults may be confusion. In some cases, they may not have any symptoms until the infection has worsened. How is this diagnosed? This condition is diagnosed based on your medical history and a physical exam. You may also have other tests, including: Urine tests. Blood tests. Tests for STIs (sexually transmitted infections). If you have had more than one UTI, a cystoscopy or imaging studies may be done to determine the cause of the infections. How is this treated? Treatment for this condition includes: Antibiotic medicine. Over-the-counter medicines to treat discomfort. Drinking enough water to stay hydrated. If you have frequent infections or have other conditions such as a kidney stone, you may need to see a health care provider who specializes in the urinary tract (urologist). In rare cases, urinary tract infections can cause sepsis. Sepsis is a life-threatening condition that occurs when the body responds to an infection. Sepsis is treated in the hospital with IV antibiotics, fluids, and other medicines. Follow these instructions at home:  Medicines Take over-the-counter and prescription medicines only as told by your health care provider. If you were prescribed an antibiotic medicine, take it as told by your health care provider. Do  not stop using the antibiotic even if you start to feel better. General instructions Make sure you: Empty your bladder often and completely. Do not hold urine for long  periods of time. Empty your bladder after sex. Wipe from front to back after urinating or having a bowel movement if you are female. Use each tissue only one time when you wipe. Drink enough fluid to keep your urine pale yellow. Keep all follow-up visits. This is important. Contact a health care provider if: Your symptoms do not get better after 1-2 days. Your symptoms go away and then return. Get help right away if: You have severe pain in your back or your lower abdomen. You have a fever or chills. You have nausea or vomiting. Summary A urinary tract infection (UTI) is an infection of any part of the urinary tract, which includes the kidneys, ureters, bladder, and urethra. Most urinary tract infections are caused by bacteria in your genital area. Treatment for this condition often includes antibiotic medicines. If you were prescribed an antibiotic medicine, take it as told by your health care provider. Do not stop using the antibiotic even if you start to feel better. Keep all follow-up visits. This is important. This information is not intended to replace advice given to you by your health care provider. Make sure you discuss any questions you have with your health care provider. Document Revised: 12/06/2019 Document Reviewed: 12/06/2019 Elsevier Patient Education  [place].  Vaginal Yeast Infection, Adult  Vaginal yeast infection is a condition that causes vaginal discharge as well as soreness, swelling, and redness (inflammation) of the vagina. This is a common condition. Some women get this infection frequently. What are the causes? This condition is caused by a change in the normal balance of the yeast (Candida) and normal bacteria that live in the vagina. This change causes an overgrowth of yeast, which causes the inflammation. What increases the risk? The condition is more likely to develop in women who: Take antibiotic medicines. Have diabetes. Take birth control  pills. Are pregnant. Douche often. Have a weak body defense system (immune system). Have been taking steroid medicines for a long time. Frequently wear tight clothing. What are the signs or symptoms? Symptoms of this condition include: White, thick, creamy vaginal discharge. Swelling, itching, redness, and irritation of the vagina. The lips of the vagina (labia) may be affected as well. Pain or a burning feeling while urinating. Pain during sex. How is this diagnosed? This condition is diagnosed based on: Your medical history. A physical exam. A pelvic exam. Your health care provider will examine a sample of your vaginal discharge under a microscope. Your health care provider may send this sample for testing to confirm the diagnosis. How is this treated? This condition is treated with medicine. Medicines may be over-the-counter or prescription. You may be told to use one or more of the following: Medicine that is taken by mouth (orally). Medicine that is applied as a cream (topically). Medicine that is inserted directly into the vagina (suppository). Follow these instructions at home: Take or apply over-the-counter and prescription medicines only as told by your health care provider. Do not use tampons until your health care provider approves. Do not have sex until your infection has cleared. Sex can prolong or worsen your symptoms of infection. Ask your health care provider when it is safe to resume sexual activity. Keep all follow-up visits. This is important. How is this prevented?  Do not wear tight clothes, such as  pantyhose or tight pants. Wear breathable cotton underwear. Do not use douches, perfumed soap, creams, or powders. Wipe from front to back after using the toilet. If you have diabetes, keep your blood sugar levels under control. Ask your health care provider for other ways to prevent yeast infections. Contact a health care provider if: You have a fever. Your  symptoms go away and then return. Your symptoms do not get better with treatment. Your symptoms get worse. You have new symptoms. You develop blisters in or around your vagina. You have blood coming from your vagina and it is not your menstrual period. You develop pain in your abdomen. Summary Vaginal yeast infection is a condition that causes discharge as well as soreness, swelling, and redness (inflammation) of the vagina. This condition is treated with medicine. Medicines may be over-the-counter or prescription. Take or apply over-the-counter and prescription medicines only as told by your health care provider. Do not douche. Resume sexual activity or use of tampons as instructed by your health care provider. Contact a health care provider if your symptoms do not get better with treatment or your symptoms go away and then return. This information is not intended to replace advice given to you by your health care provider. Make sure you discuss any questions you have with your health care provider. Document Revised: 07/13/2020 Document Reviewed: 07/13/2020 Elsevier Patient Education  [place].    If you have lab work done today you will be contacted with your lab results within the next 2 weeks.  If you have not heard from [place] then please contact us. The fastest way to get your results is to register for My Chart.   IF you received an x-ray today, you will receive an invoice from [facility]. Please contact [facility] at [phone] with questions or concerns regarding your invoice.   IF you received labwork today, you will receive an invoice from [facility]. Please contact [facility] at [phone] with questions or concerns regarding your invoice.   Our billing staff will not be able to assist you with questions regarding bills from these companies.  You will be contacted with the lab results as soon as they are available. The fastest way to get your results  is to activate your My Chart account. Instructions are located on the last page of this paperwork. If you have not heard from [place] regarding the results in 2 weeks, please contact this office.

## 2021-10-15 NOTE — Progress Notes (Signed)
Subjective:  Patient ID: Peggy Church, female    DOB: 11-17-1955  Age: 66 y.o. MRN: [id number]  CC:  Chief Complaint  Patient presents with   Vaginitis    Patient states she went to go get some dental work and the ABX she feels gave her a yeast infection. She has been having some burning , itching and discharge and now frequent urination. Patient states she took some otc yeast cream and felt a little better.    HPI Peggy Church presents for   Vaginal itching Recent antibiotics after dental work few weeks ago. Off abx - Noticed some burning, itching, discharge - about 1 week ago. Burning in vagina area.  urinary frequency starting few days ago. Slight dysuria. Hot and cold feeling yesterday, no measured fever. No abd pain/n/v. Treatments with over-the-counter antifungal cream 1 day only. slight improvement better today.   History Patient Active Problem List   Diagnosis Date Noted   BMI 31.0-31.9,adult 11/05/2015   HTN (hypertension) 06/26/2012   Past Medical History:  Diagnosis Date   Allergy    Hypertension    Migraine    Past Surgical History:  Procedure Laterality Date   BLADDER SURGERY  1963   stretched   DENTAL SURGERY     ECTOPIC PREGNANCY SURGERY     TONSILLECTOMY AND ADENOIDECTOMY     TUBAL LIGATION     Allergies  Allergen Reactions   Benicar [Olmesartan]     Smells and senses things that aren't really there.   Lisinopril     cough   Statins Other (See Comments)    Myalgia, muscle weakness - lipitor.    Sulfa Drugs Cross Reactors    Prior to Admission medications   Medication Sig Start Date End Date Taking? Authorizing Provider  acyclovir (ZOVIRAX) 200 MG capsule Take 4 capsules (800 mg total) by mouth 2 (two) times daily. For 5 days with outbreak. 04/03/19  Yes [name], MD  amLODipine (NORVASC) 5 MG tablet Take 1 & 1/2 tablets (7.5 mg total) by mouth daily. 08/23/21  Yes [name], MD  Multiple Vitamin (MULTI-VITAMINS) TABS Take by  mouth daily.   Yes [provider]  potassium chloride SA (KLOR-CON) 20 MEQ tablet TAKE 1 TABLET BY MOUTH TWICE DAILY (NEED TO SCHEDULE 6 MONTH APPT IN MAY) 08/02/19  Yes [name], MD  amoxicillin (AMOXIL) 500 MG capsule Take 1 capsule by mouth 3 times a day. Start 2 days before surgery. Patient not taking: Reported on 10/15/2021 09/08/21      Social History   Socioeconomic History   Marital status: Divorced    Spouse name: n/a   Number of children: 3   Years of education: college   Highest education level: Not on file  Occupational History   Occupation: Consulting civil engineer    Comment: [employer]  Tobacco Use   Smoking status: Former    Types: Cigarettes    Quit date: 07/18/1991    Years since quitting: 30.2   Smokeless tobacco: Never  Substance and Sexual Activity   Alcohol use: Yes    Alcohol/week: 0.0 standard drinks of alcohol    Comment: rarely   Drug use: No   Sexual activity: Not on file  Other Topics Concern   Not on file  Social History Narrative   Lives with her daughter and fiance.   Right handed   Lives in 2 story home.   Social Determinants of Health   Financial Resource Strain: Not on  file  Food Insecurity: Not on file  Transportation Needs: Not on file  Physical Activity: Not on file  Stress: Not on file  Social Connections: Not on file  Intimate Partner Violence: Not on file    Review of Systems Per HPI  Objective:   Vitals:   10/15/21 0827  BP: (!) 142/88  Pulse: 64  Resp: 19  Temp: 98.1 F (36.7 C)  TempSrc: Temporal  SpO2: 98%  Weight: 194 lb (88 kg)  Height: '5\' 6"'$  (1.676 m)     Physical Exam Vitals reviewed.  Constitutional:      General: She is not in acute distress.    Appearance: Normal appearance. She is well-developed. She is not ill-appearing, toxic-appearing or diaphoretic.  HENT:     Head: Normocephalic and atraumatic.  Cardiovascular:     Rate and Rhythm: Normal rate.  Pulmonary:     Effort:  Pulmonary effort is normal.  Abdominal:     General: There is no distension.     Tenderness: There is no abdominal tenderness. There is no right CVA tenderness, left CVA tenderness or guarding.     Comments: No abdominal or pelvic pain on palpation.  Neurological:     Mental Status: She is alert and oriented to person, place, and time.  Psychiatric:        Mood and Affect: Mood normal.   GYN exam deferred as improving symptoms., plans to return if symptoms not improving.   Results for orders placed or performed in visit on 10/15/21  POCT urinalysis dipstick  Result Value Ref Range   Color, UA yellow yellow   Clarity, UA clear clear   Glucose, UA negative negative mg/dL   Bilirubin, UA negative negative   Ketones, POC UA negative negative mg/dL   Spec Grav, UA >=1.030 (A) 1.010 - 1.025   Blood, UA moderate (A) negative   pH, UA 7.0 5.0 - 8.0   Protein Ur, POC =30 (A) negative mg/dL   Urobilinogen, UA 0.2 0.2 or 1.0 E.U./dL   Nitrite, UA Negative Negative   Leukocytes, UA Large (3+) (A) Negative     Assessment & Plan:  Peggy Church is a 66 y.o. female . Urine frequency - Plan: POCT urinalysis dipstick, Urine Culture, nitrofurantoin, macrocrystal-monohydrate, (MACROBID) 100 MG capsule  Acute cystitis with hematuria  Candida vaginitis - Plan: fluconazole (DIFLUCAN) 150 MG tablet  Likely initial yeast infection from recent antibiotics, now with signs and symptoms of UTI as well.  Check urine culture.  Start Macrobid, Diflucan once today and repeat in 1 week.  RTC precautions if not improving, sooner or urgent care if worse.  Meds ordered this encounter  Medications   fluconazole (DIFLUCAN) 150 MG tablet    Sig: Take 1 tablet (150 mg total) by mouth once for 1 dose. Repeat in 1 week.    Dispense:  2 tablet    Refill:  0   nitrofurantoin, macrocrystal-monohydrate, (MACROBID) 100 MG capsule    Sig: Take 1 capsule (100 mg total) by mouth 2 (two) times daily.    Dispense:  14  capsule    Refill:  0   Patient Instructions   I suspect you do have a yeast infection as well as a secondary urinary tract infection.  Start antibiotic Macrodantin twice per day, drink plenty of fluids.  Over-the-counter Azo if needed.  Take Diflucan once today, and repeat in 1 week as unfortunately yeast infection may worsen with restart of antibiotics.  If symptoms are not improving  into next week, return for recheck and exam.  Sooner or urgent care if worse.  Hope you feel better soon.   Urinary Tract Infection, Adult  A urinary tract infection (UTI) is an infection of any part of the urinary tract. The urinary tract includes the kidneys, ureters, bladder, and urethra. These organs make, store, and get rid of urine in the body. An upper UTI affects the ureters and kidneys. A lower UTI affects the bladder and urethra. What are the causes? Most urinary tract infections are caused by bacteria in your genital area around your urethra, where urine leaves your body. These bacteria grow and cause inflammation of your urinary tract. What increases the risk? You are more likely to develop this condition if: You have a urinary catheter that stays in place. You are not able to control when you urinate or have a bowel movement (incontinence). You are female and you: Use a spermicide or diaphragm for birth control. Have low estrogen levels. Are pregnant. You have certain genes that increase your risk. You are sexually active. You take antibiotic medicines. You have a condition that causes your flow of urine to slow down, such as: An enlarged prostate, if you are female. Blockage in your urethra. A kidney stone. A nerve condition that affects your bladder control (neurogenic bladder). Not getting enough to drink, or not urinating often. You have certain medical conditions, such as: Diabetes. A weak disease-fighting system (immunesystem). Sickle cell disease. Gout. Spinal cord injury. What  are the signs or symptoms? Symptoms of this condition include: Needing to urinate right away (urgency). Frequent urination. This may include small amounts of urine each time you urinate. Pain or burning with urination. Blood in the urine. Urine that smells bad or unusual. Trouble urinating. Cloudy urine. Vaginal discharge, if you are female. Pain in the abdomen or the lower back. You may also have: Vomiting or a decreased appetite. Confusion. Irritability or tiredness. A fever or chills. Diarrhea. The first symptom in older adults may be confusion. In some cases, they may not have any symptoms until the infection has worsened. How is this diagnosed? This condition is diagnosed based on your medical history and a physical exam. You may also have other tests, including: Urine tests. Blood tests. Tests for STIs (sexually transmitted infections). If you have had more than one UTI, a cystoscopy or imaging studies may be done to determine the cause of the infections. How is this treated? Treatment for this condition includes: Antibiotic medicine. Over-the-counter medicines to treat discomfort. Drinking enough water to stay hydrated. If you have frequent infections or have other conditions such as a kidney stone, you may need to see a health care provider who specializes in the urinary tract (urologist). In rare cases, urinary tract infections can cause sepsis. Sepsis is a life-threatening condition that occurs when the body responds to an infection. Sepsis is treated in the hospital with IV antibiotics, fluids, and other medicines. Follow these instructions at home:  Medicines Take over-the-counter and prescription medicines only as told by your health care provider. If you were prescribed an antibiotic medicine, take it as told by your health care provider. Do not stop using the antibiotic even if you start to feel better. General instructions Make sure you: Empty your bladder often  and completely. Do not hold urine for long periods of time. Empty your bladder after sex. Wipe from front to back after urinating or having a bowel movement if you are female. Use each tissue only  one time when you wipe. Drink enough fluid to keep your urine pale yellow. Keep all follow-up visits. This is important. Contact a health care provider if: Your symptoms do not get better after 1-2 days. Your symptoms go away and then return. Get help right away if: You have severe pain in your back or your lower abdomen. You have a fever or chills. You have nausea or vomiting. Summary A urinary tract infection (UTI) is an infection of any part of the urinary tract, which includes the kidneys, ureters, bladder, and urethra. Most urinary tract infections are caused by bacteria in your genital area. Treatment for this condition often includes antibiotic medicines. If you were prescribed an antibiotic medicine, take it as told by your health care provider. Do not stop using the antibiotic even if you start to feel better. Keep all follow-up visits. This is important. This information is not intended to replace advice given to you by your health care provider. Make sure you discuss any questions you have with your health care provider. Document Revised: 12/06/2019 Document Reviewed: 12/06/2019 Elsevier Patient Education  [place].  Vaginal Yeast Infection, Adult  Vaginal yeast infection is a condition that causes vaginal discharge as well as soreness, swelling, and redness (inflammation) of the vagina. This is a common condition. Some women get this infection frequently. What are the causes? This condition is caused by a change in the normal balance of the yeast (Candida) and normal bacteria that live in the vagina. This change causes an overgrowth of yeast, which causes the inflammation. What increases the risk? The condition is more likely to develop in women who: Take antibiotic  medicines. Have diabetes. Take birth control pills. Are pregnant. Douche often. Have a weak body defense system (immune system). Have been taking steroid medicines for a long time. Frequently wear tight clothing. What are the signs or symptoms? Symptoms of this condition include: White, thick, creamy vaginal discharge. Swelling, itching, redness, and irritation of the vagina. The lips of the vagina (labia) may be affected as well. Pain or a burning feeling while urinating. Pain during sex. How is this diagnosed? This condition is diagnosed based on: Your medical history. A physical exam. A pelvic exam. Your health care provider will examine a sample of your vaginal discharge under a microscope. Your health care provider may send this sample for testing to confirm the diagnosis. How is this treated? This condition is treated with medicine. Medicines may be over-the-counter or prescription. You may be told to use one or more of the following: Medicine that is taken by mouth (orally). Medicine that is applied as a cream (topically). Medicine that is inserted directly into the vagina (suppository). Follow these instructions at home: Take or apply over-the-counter and prescription medicines only as told by your health care provider. Do not use tampons until your health care provider approves. Do not have sex until your infection has cleared. Sex can prolong or worsen your symptoms of infection. Ask your health care provider when it is safe to resume sexual activity. Keep all follow-up visits. This is important. How is this prevented?  Do not wear tight clothes, such as pantyhose or tight pants. Wear breathable cotton underwear. Do not use douches, perfumed soap, creams, or powders. Wipe from front to back after using the toilet. If you have diabetes, keep your blood sugar levels under control. Ask your health care provider for other ways to prevent yeast infections. Contact a health  care provider if: You have  a fever. Your symptoms go away and then return. Your symptoms do not get better with treatment. Your symptoms get worse. You have new symptoms. You develop blisters in or around your vagina. You have blood coming from your vagina and it is not your menstrual period. You develop pain in your abdomen. Summary Vaginal yeast infection is a condition that causes discharge as well as soreness, swelling, and redness (inflammation) of the vagina. This condition is treated with medicine. Medicines may be over-the-counter or prescription. Take or apply over-the-counter and prescription medicines only as told by your health care provider. Do not douche. Resume sexual activity or use of tampons as instructed by your health care provider. Contact a health care provider if your symptoms do not get better with treatment or your symptoms go away and then return. This information is not intended to replace advice given to you by your health care provider. Make sure you discuss any questions you have with your health care provider. Document Revised: 07/13/2020 Document Reviewed: 07/13/2020 Elsevier Patient Education  [place].    If you have lab work done today you will be contacted with your lab results within the next 2 weeks.  If you have not heard from [place] then please contact us. The fastest way to get your results is to register for My Chart.   IF you received an x-ray today, you will receive an invoice from [facility]. Please contact [facility] at [phone] with questions or concerns regarding your invoice.   IF you received labwork today, you will receive an invoice from [facility]. Please contact [facility] at [phone] with questions or concerns regarding your invoice.   Our billing staff will not be able to assist you with questions regarding bills from these companies.  You will be contacted with the lab results as soon as they are  available. The fastest way to get your results is to activate your My Chart account. Instructions are located on the last page of this paperwork. If you have not heard from [place] regarding the results in 2 weeks, please contact this office.        Signed,   Merri Ray, MD [place] 10/15/21 8:57 AM

## 2021-10-18 LAB — URINE CULTURE
MICRO NUMBER:: 13508499
SPECIMEN QUALITY:: ADEQUATE

## 2021-10-19 ENCOUNTER — Encounter: Payer: Self-pay | Admitting: Internal Medicine

## 2021-11-01 ENCOUNTER — Other Ambulatory Visit (HOSPITAL_BASED_OUTPATIENT_CLINIC_OR_DEPARTMENT_OTHER): Payer: Self-pay

## 2021-11-10 ENCOUNTER — Other Ambulatory Visit (HOSPITAL_BASED_OUTPATIENT_CLINIC_OR_DEPARTMENT_OTHER): Payer: Self-pay

## 2021-12-21 ENCOUNTER — Other Ambulatory Visit (HOSPITAL_BASED_OUTPATIENT_CLINIC_OR_DEPARTMENT_OTHER): Payer: Self-pay

## 2022-01-13 ENCOUNTER — Other Ambulatory Visit (HOSPITAL_BASED_OUTPATIENT_CLINIC_OR_DEPARTMENT_OTHER): Payer: Self-pay

## 2022-02-16 ENCOUNTER — Other Ambulatory Visit (HOSPITAL_BASED_OUTPATIENT_CLINIC_OR_DEPARTMENT_OTHER): Payer: Self-pay

## 2022-03-28 ENCOUNTER — Other Ambulatory Visit: Payer: Self-pay | Admitting: Family Medicine

## 2022-03-28 ENCOUNTER — Other Ambulatory Visit (HOSPITAL_BASED_OUTPATIENT_CLINIC_OR_DEPARTMENT_OTHER): Payer: Self-pay

## 2022-03-28 DIAGNOSIS — I10 Essential (primary) hypertension: Secondary | ICD-10-CM

## 2022-03-28 MED ORDER — AMLODIPINE BESYLATE 5 MG PO TABS
7.5000 mg | ORAL_TABLET | Freq: Every day | ORAL | 1 refills | Status: DC
  Filled 2022-03-28: qty 45, 30d supply, fill #0
  Filled 2022-04-25: qty 45, 30d supply, fill #1
  Filled 2022-05-23: qty 45, 30d supply, fill #2
  Filled 2022-06-21: qty 45, 30d supply, fill #3
  Filled 2022-07-19: qty 45, 30d supply, fill #4
  Filled 2022-08-17: qty 45, 30d supply, fill #5

## 2022-04-25 ENCOUNTER — Other Ambulatory Visit (HOSPITAL_COMMUNITY): Payer: Self-pay

## 2022-04-25 ENCOUNTER — Other Ambulatory Visit (HOSPITAL_BASED_OUTPATIENT_CLINIC_OR_DEPARTMENT_OTHER): Payer: Self-pay

## 2022-04-25 ENCOUNTER — Other Ambulatory Visit: Payer: Self-pay

## 2022-05-11 ENCOUNTER — Encounter: Payer: Self-pay | Admitting: Family Medicine

## 2022-05-11 ENCOUNTER — Other Ambulatory Visit (HOSPITAL_COMMUNITY): Payer: Self-pay

## 2022-05-11 ENCOUNTER — Other Ambulatory Visit: Payer: Self-pay

## 2022-05-11 DIAGNOSIS — B009 Herpesviral infection, unspecified: Secondary | ICD-10-CM

## 2022-05-11 MED ORDER — ACYCLOVIR 200 MG PO CAPS
800.0000 mg | ORAL_CAPSULE | Freq: Two times a day (BID) | ORAL | 0 refills | Status: DC
  Filled 2022-05-11 (×2): qty 60, 8d supply, fill #0

## 2022-05-12 ENCOUNTER — Other Ambulatory Visit: Payer: Self-pay

## 2022-05-12 ENCOUNTER — Other Ambulatory Visit (HOSPITAL_COMMUNITY): Payer: Self-pay

## 2022-05-12 ENCOUNTER — Other Ambulatory Visit (HOSPITAL_BASED_OUTPATIENT_CLINIC_OR_DEPARTMENT_OTHER): Payer: Self-pay

## 2022-05-23 ENCOUNTER — Other Ambulatory Visit: Payer: Self-pay

## 2022-05-23 ENCOUNTER — Other Ambulatory Visit (HOSPITAL_COMMUNITY): Payer: Self-pay

## 2022-06-21 ENCOUNTER — Other Ambulatory Visit (HOSPITAL_COMMUNITY): Payer: Self-pay

## 2022-07-18 ENCOUNTER — Other Ambulatory Visit (HOSPITAL_BASED_OUTPATIENT_CLINIC_OR_DEPARTMENT_OTHER): Payer: Self-pay

## 2022-07-19 ENCOUNTER — Other Ambulatory Visit (HOSPITAL_COMMUNITY): Payer: Self-pay

## 2022-07-19 ENCOUNTER — Other Ambulatory Visit: Payer: Self-pay

## 2022-08-04 ENCOUNTER — Other Ambulatory Visit: Payer: Self-pay | Admitting: Family Medicine

## 2022-08-04 ENCOUNTER — Other Ambulatory Visit (HOSPITAL_COMMUNITY): Payer: Self-pay

## 2022-08-04 DIAGNOSIS — B009 Herpesviral infection, unspecified: Secondary | ICD-10-CM

## 2022-08-05 ENCOUNTER — Other Ambulatory Visit (HOSPITAL_BASED_OUTPATIENT_CLINIC_OR_DEPARTMENT_OTHER): Payer: Self-pay

## 2022-08-08 ENCOUNTER — Other Ambulatory Visit (HOSPITAL_COMMUNITY): Payer: Self-pay

## 2022-08-10 ENCOUNTER — Other Ambulatory Visit: Payer: Self-pay

## 2022-08-17 ENCOUNTER — Other Ambulatory Visit (HOSPITAL_COMMUNITY): Payer: Self-pay

## 2022-09-08 ENCOUNTER — Other Ambulatory Visit (HOSPITAL_COMMUNITY): Payer: Self-pay

## 2022-09-08 ENCOUNTER — Other Ambulatory Visit: Payer: Self-pay | Admitting: Family Medicine

## 2022-09-08 DIAGNOSIS — I10 Essential (primary) hypertension: Secondary | ICD-10-CM

## 2022-09-08 MED ORDER — AMLODIPINE BESYLATE 5 MG PO TABS
7.5000 mg | ORAL_TABLET | Freq: Every day | ORAL | 0 refills | Status: DC
  Filled 2022-09-08 – 2022-09-14 (×2): qty 135, 90d supply, fill #0

## 2022-09-14 ENCOUNTER — Other Ambulatory Visit: Payer: Self-pay

## 2022-09-14 ENCOUNTER — Other Ambulatory Visit (HOSPITAL_COMMUNITY): Payer: Self-pay

## 2022-09-14 ENCOUNTER — Other Ambulatory Visit (HOSPITAL_BASED_OUTPATIENT_CLINIC_OR_DEPARTMENT_OTHER): Payer: Self-pay

## 2022-10-06 ENCOUNTER — Other Ambulatory Visit (HOSPITAL_COMMUNITY): Payer: Self-pay

## 2022-10-06 ENCOUNTER — Telehealth: Payer: Managed Care, Other (non HMO) | Admitting: Physician Assistant

## 2022-10-06 ENCOUNTER — Ambulatory Visit
Admission: EM | Admit: 2022-10-06 | Discharge: 2022-10-06 | Disposition: A | Discharge status: Home / Self Care | Payer: Managed Care, Other (non HMO) | Attending: Urgent Care | Admitting: Urgent Care

## 2022-10-06 DIAGNOSIS — R82998 Other abnormal findings in urine: Secondary | ICD-10-CM

## 2022-10-06 DIAGNOSIS — N3001 Acute cystitis with hematuria: Secondary | ICD-10-CM | POA: Diagnosis not present

## 2022-10-06 DIAGNOSIS — L249 Irritant contact dermatitis, unspecified cause: Secondary | ICD-10-CM | POA: Insufficient documentation

## 2022-10-06 DIAGNOSIS — N390 Urinary tract infection, site not specified: Secondary | ICD-10-CM | POA: Insufficient documentation

## 2022-10-06 DIAGNOSIS — R350 Frequency of micturition: Secondary | ICD-10-CM

## 2022-10-06 DIAGNOSIS — R300 Dysuria: Secondary | ICD-10-CM

## 2022-10-06 LAB — POCT URINALYSIS DIP (MANUAL ENTRY)
Bilirubin, UA: NEGATIVE
Glucose, UA: NEGATIVE mg/dL
Ketones, POC UA: NEGATIVE mg/dL
Nitrite, UA: NEGATIVE
Protein Ur, POC: 30 mg/dL — AB
Spec Grav, UA: 1.025 (ref 1.010–1.025)
Urobilinogen, UA: 1 E.U./dL
pH, UA: 6 (ref 5.0–8.0)

## 2022-10-06 MED ORDER — TRIAMCINOLONE ACETONIDE 0.1 % EX CREA: 1.0000 | TOPICAL_CREAM | Freq: Two times a day (BID) | CUTANEOUS | 0 refills | Status: DC

## 2022-10-06 MED ORDER — CEPHALEXIN 500 MG PO CAPS: 500.0000 mg | ORAL_CAPSULE | Freq: Two times a day (BID) | ORAL | 0 refills | Status: DC

## 2022-10-06 MED ORDER — PHENAZOPYRIDINE HCL 200 MG PO TABS: 200.0000 mg | ORAL_TABLET | Freq: Three times a day (TID) | ORAL | 0 refills | Status: DC | PRN

## 2022-10-06 MED ORDER — FLUCONAZOLE 150 MG PO TABS: 150.0000 mg | ORAL_TABLET | ORAL | 0 refills | Status: DC

## 2022-10-06 NOTE — ED Provider Notes (Signed)
[facility] - URGENT CARE CENTER  Note:  This document was prepared using Conservation officer, historic buildings and may include unintentional dictation errors.  MRN: [id number] DOB: [birth year]  Subjective:   Peggy Church is a 67 y.o. female presenting for 2-week history of persistent urinary frequency and urgency, nocturia now noticing hematuria in the past day. Has longstanding history of UTIs.  Denies fever, nausea, vomiting, pelvic pain, flank pain.  She is also concerned about a tick that is still attached to her left lower leg.  Has been there less than a day.  No other rashes, joint pains, headaches, cough, sick symptoms.  No current facility-administered medications for this encounter.  Current Outpatient Medications:    acyclovir (ZOVIRAX) 200 MG capsule, Take 4 capsules (800 mg total) by mouth 2 (two) times daily. For 5 days with outbreak., Disp: 60 capsule, Rfl: 0   amLODipine (NORVASC) 5 MG tablet, Take 1 & 1/2 tablets (7.5 mg total) by mouth daily., Disp: 135 tablet, Rfl: 0   amoxicillin (AMOXIL) 500 MG capsule, Take 1 capsule by mouth 3 times a day. Start 2 days before surgery. (Patient not taking: Reported on 10/15/2021), Disp: 21 capsule, Rfl: 0   Multiple Vitamin (MULTI-VITAMINS) TABS, Take by mouth daily., Disp: , Rfl:    nitrofurantoin, macrocrystal-monohydrate, (MACROBID) 100 MG capsule, Take 1 capsule (100 mg total) by mouth 2 (two) times daily., Disp: 14 capsule, Rfl: 0   potassium chloride SA (KLOR-CON) 20 MEQ tablet, TAKE 1 TABLET BY MOUTH TWICE DAILY (NEED TO SCHEDULE 6 MONTH APPT IN MAY), Disp: 180 tablet, Rfl: 0   Allergies  Allergen Reactions   Benicar [Olmesartan]     Smells and senses things that aren't really there.   Lisinopril     cough   Statins Other (See Comments)    Myalgia, muscle weakness - lipitor.    Sulfa Drugs Cross Reactors     Past Medical History:  Diagnosis Date   Allergy    Hypertension    Migraine      Past Surgical History:   Procedure Laterality Date   BLADDER SURGERY  1963   stretched   DENTAL SURGERY     ECTOPIC PREGNANCY SURGERY     TONSILLECTOMY AND ADENOIDECTOMY     TUBAL LIGATION      Family History  Problem Relation Age of Onset   Hyperlipidemia Mother    Hypertension Mother    Stroke Mother    Heart disease Mother    Cancer Father        prostate   Hyperlipidemia Father    Hypertension Father    Cancer Brother     Social History   Tobacco Use   Smoking status: Former    Types: Cigarettes    Quit date: 07/18/1991    Years since quitting: 31.2   Smokeless tobacco: Never  Vaping Use   Vaping Use: Never used  Substance Use Topics   Alcohol use: Yes    Alcohol/week: 0.0 standard drinks of alcohol    Comment: rarely   Drug use: No    ROS   Objective:   Vitals: BP 135/70 (BP Location: Right Arm)   Pulse 66   Temp 98.1 F (36.7 C) (Oral)   Resp 18   SpO2 97%   Physical Exam Constitutional:      General: She is not in acute distress.    Appearance: Normal appearance. She is well-developed. She is not ill-appearing, toxic-appearing or diaphoretic.  HENT:  Head: Normocephalic and atraumatic.     Nose: Nose normal.     Mouth/Throat:     Mouth: Mucous membranes are moist.  Eyes:     General: No scleral icterus.       Right eye: No discharge.        Left eye: No discharge.     Extraocular Movements: Extraocular movements intact.     Conjunctiva/sclera: Conjunctivae normal.  Cardiovascular:     Rate and Rhythm: Normal rate.  Pulmonary:     Effort: Pulmonary effort is normal.  Abdominal:     General: Bowel sounds are normal. There is no distension.     Palpations: Abdomen is soft. There is no mass.     Tenderness: There is no abdominal tenderness. There is no right CVA tenderness, left CVA tenderness, guarding or rebound.  Skin:    General: Skin is warm and dry.       Neurological:     General: No focal deficit present.     Mental Status: She is alert and  oriented to person, place, and time.  Psychiatric:        Mood and Affect: Mood normal.        Behavior: Behavior normal.        Thought Content: Thought content normal.        Judgment: Judgment normal.     Results for orders placed or performed during the hospital encounter of 10/06/22 (from the past 24 hour(s))  POCT urinalysis dipstick     Status: Abnormal   Collection Time: 10/06/22  8:28 AM  Result Value Ref Range   Color, UA red (A) yellow   Clarity, UA cloudy (A) clear   Glucose, UA negative negative mg/dL   Bilirubin, UA negative negative   Ketones, POC UA negative negative mg/dL   Spec Grav, UA 4.098 1.191 - 1.025   Blood, UA large (A) negative   pH, UA 6.0 5.0 - 8.0   Protein Ur, POC =30 (A) negative mg/dL   Urobilinogen, UA 1.0 0.2 or 1.0 E.U./dL   Nitrite, UA Negative Negative   Leukocytes, UA Small (1+) (A) Negative    Assessment and Plan :   PDMP not reviewed this encounter.  1. Acute cystitis with hematuria   2. Urinary frequency   3. Recurrent UTI   4. Irritant dermatitis    Start Keflex to cover for acute cystitis, urine culture pending.  Recommended aggressive hydration, limiting urinary irritants.  Patient is to use fluconazole for antibiotic associated yeast infection, requested Pyridium and therefore I sent a prescription for this.  Tick removed successfully.  Recommended triamcinolone cream locally for an irritant dermatitis.  Counseled patient on potential for adverse effects with medications prescribed/recommended today, ER and return-to-clinic precautions discussed, patient verbalized understanding.    Wallis Bamberg, PA-C 10/06/22 1139

## 2022-10-06 NOTE — Progress Notes (Signed)
Because of dark red/brown urine with this and increased concern for more complicated UTI and need for urine culture, I feel your condition warrants further evaluation and I recommend that you be seen in a face to face visit.   NOTE: There will be NO CHARGE for this eVisit   If you are having a true medical emergency please call 911.      For an urgent face to face visit, Peggy Church has eight urgent care centers for your convenience:   NEW!! [facility] Center at [facility] Get Driving Directions [phone] [address], [address] [place], [address]    [facility] Health Urgent Care Center at [facility] Get Driving Directions [phone] [address] [place], [place] [address]   [facility] Health Urgent Care Center [facility]) Get Driving Directions [phone] [address] [place], [place] [address]  [facility] Health Urgent Care Center [facility]) Get Driving Directions [phone] [address] [place],  [place]  [address]  [facility] Health Urgent Care Center [facility] - at [employer]  [phone] [address] [place],  [place] [address]   [facility] Health Urgent Care at [facility] Get Driving Directions [phone] [address], [address] [place], [place] [address]   [facility] Health Urgent Care at [facility] Get Driving Directions  [phone] [address] [place], [place] [address]   [facility] Health Urgent Care at [facility] Directions [phone] [address] [place], [place] [address]  Your MyChart E-visit questionnaire answers were reviewed by a board certified advanced clinical practitioner to complete your personal care plan based on your specific symptoms.  Thank you for using e-Visits.

## 2022-10-06 NOTE — ED Triage Notes (Addendum)
Pt c/o hematuria this am-urinary freq x "couple weeks"-NAD-steady gait-pt added c/o of tick LLE

## 2022-10-06 NOTE — Discharge Instructions (Addendum)
Please start cephalexin to address an urinary tract infection. Make sure you hydrate very well with plain water and a quantity of 64 ounces of water a day.  Please limit drinks that are considered urinary irritants such as soda, sweet tea, coffee, energy drinks, alcohol.  These can worsen your urinary and genital symptoms but also be the source of them.  I will let you know about your urine culture results through MyChart to see if we need to prescribe or change your antibiotics based off of those results.  Apply steroid cream to the tick bite area twice daily for 1 week.

## 2022-10-08 LAB — URINE CULTURE: Culture: 50000 — AB

## 2022-10-10 ENCOUNTER — Ambulatory Visit: Payer: Managed Care, Other (non HMO) | Admitting: Family Medicine

## 2022-10-29 ENCOUNTER — Ambulatory Visit
Admission: EM | Admit: 2022-10-29 | Discharge: 2022-10-29 | Disposition: A | Discharge status: Home / Self Care | Payer: Managed Care, Other (non HMO) | Attending: Urgent Care | Admitting: Urgent Care

## 2022-10-29 DIAGNOSIS — J329 Chronic sinusitis, unspecified: Secondary | ICD-10-CM | POA: Diagnosis not present

## 2022-10-29 DIAGNOSIS — B9789 Other viral agents as the cause of diseases classified elsewhere: Secondary | ICD-10-CM | POA: Diagnosis not present

## 2022-10-29 DIAGNOSIS — J309 Allergic rhinitis, unspecified: Secondary | ICD-10-CM | POA: Diagnosis not present

## 2022-10-29 MED ORDER — CETIRIZINE HCL 10 MG PO TABS: 10.0000 mg | ORAL_TABLET | Freq: Every day | ORAL | 0 refills | Status: DC

## 2022-10-29 MED ORDER — PREDNISONE 20 MG PO TABS: ORAL_TABLET | ORAL | 0 refills | Status: DC

## 2022-10-29 MED ORDER — ACETAMINOPHEN 325 MG PO TABS: 650.0000 mg | ORAL_TABLET | Freq: Four times a day (QID) | ORAL | 0 refills | Status: DC | PRN

## 2022-10-29 NOTE — ED Provider Notes (Signed)
[facility] - [facility]RG[facility]  Note:  This document was prepared using Conservation officer, historic buildings and may include unintentional dictation errors.  MRN: [id number] DOB: 11-07-1955  Subjective:   Peggy Church is a 67 y.o. female presenting for 4-day history of persistent fullness in Peggy Church ears, hearing echoes in Peggy Church left ear, head pressure over the left side, drainage on Peggy Church throat.  Peggy Church primary bothering symptom however is Peggy Church ears.  No fever, drainage of pus or bleeding.  Peggy Church does have allergic rhinitis but does not take an antihistamine consistently for this.  No history of stroke but Peggy Church has had Bell's palsy.  Has previously done well with prednisone.  No current facility-administered medications for this encounter.  Current Outpatient Medications:    acyclovir (ZOVIRAX) 200 MG capsule, Take 4 capsules (800 mg total) by mouth 2 (two) times daily. For 5 days with outbreak., Disp: 60 capsule, Rfl: 0   amLODipine (NORVASC) 5 MG tablet, Take 1 & 1/2 tablets (7.5 mg total) by mouth daily., Disp: 135 tablet, Rfl: 0   amoxicillin (AMOXIL) 500 MG capsule, Take 1 capsule by mouth 3 times a day. Start 2 days before surgery. (Patient not taking: Reported on 10/15/2021), Disp: 21 capsule, Rfl: 0   cephALEXin (KEFLEX) 500 MG capsule, Take 1 capsule (500 mg total) by mouth 2 (two) times daily., Disp: 10 capsule, Rfl: 0   fluconazole (DIFLUCAN) 150 MG tablet, Take 1 tablet (150 mg total) by mouth once a week., Disp: 2 tablet, Rfl: 0   Multiple Vitamin (MULTI-VITAMINS) TABS, Take by mouth daily., Disp: , Rfl:    nitrofurantoin, macrocrystal-monohydrate, (MACROBID) 100 MG capsule, Take 1 capsule (100 mg total) by mouth 2 (two) times daily., Disp: 14 capsule, Rfl: 0   phenazopyridine (PYRIDIUM) 200 MG tablet, Take 1 tablet (200 mg total) by mouth 3 (three) times daily as needed for pain., Disp: 10 tablet, Rfl: 0   potassium chloride SA (KLOR-CON) 20 MEQ tablet, TAKE 1 TABLET BY MOUTH TWICE DAILY  (NEED TO SCHEDULE 6 MONTH APPT IN MAY), Disp: 180 tablet, Rfl: 0   triamcinolone cream (KENALOG) 0.1 %, Apply 1 Application topically 2 (two) times daily., Disp: 30 g, Rfl: 0   Allergies  Allergen Reactions   Benicar [Olmesartan]     Smells and senses things that aren't really there.   Lisinopril     cough   Statins Other (See Comments)    Myalgia, muscle weakness - lipitor.    Sulfa Drugs Cross Reactors     Past Medical History:  Diagnosis Date   Allergy    Hypertension    Migraine      Past Surgical History:  Procedure Laterality Date   BLADDER SURGERY  1963   stretched   DENTAL SURGERY     ECTOPIC PREGNANCY SURGERY     TONSILLECTOMY AND ADENOIDECTOMY     TUBAL LIGATION      Family History  Problem Relation Age of Onset   Hyperlipidemia Mother    Hypertension Mother    Stroke Mother    Heart disease Mother    Cancer Father        prostate   Hyperlipidemia Father    Hypertension Father    Cancer Brother     Social History   Tobacco Use   Smoking status: Former    Types: Cigarettes    Quit date: 07/18/1991    Years since quitting: 31.3   Smokeless tobacco: Never  Vaping Use   Vaping Use: Never  used  Substance Use Topics   Alcohol use: Yes    Alcohol/week: 0.0 standard drinks of alcohol    Comment: rarely   Drug use: No    ROS   Objective:   Vitals: BP 123/80 (BP Location: Right Arm)   Pulse 62   Temp 98.1 F (36.7 C) (Oral)   Resp 18   SpO2 98%   Physical Exam Constitutional:      General: Peggy Church is not in acute distress.    Appearance: Normal appearance. Peggy Church is well-developed and normal weight. Peggy Church is not ill-appearing, toxic-appearing or diaphoretic.  HENT:     Head: Normocephalic and atraumatic.     Right Ear: Tympanic membrane, ear canal and external ear normal. No drainage or tenderness. No middle ear effusion. There is no impacted cerumen. Tympanic membrane is not erythematous or bulging.     Left Ear: Tympanic membrane, ear canal and  external ear normal. No drainage or tenderness.  No middle ear effusion. There is no impacted cerumen. Tympanic membrane is not erythematous or bulging.     Nose: No congestion or rhinorrhea.     Mouth/Throat:     Mouth: Mucous membranes are moist. No oral lesions.     Pharynx: No pharyngeal swelling, oropharyngeal exudate, posterior oropharyngeal erythema or uvula swelling.     Tonsils: No tonsillar exudate or tonsillar abscesses.  Eyes:     General: No scleral icterus.       Right eye: No discharge.        Left eye: No discharge.     Extraocular Movements: Extraocular movements intact.     Right eye: Normal extraocular motion.     Left eye: Normal extraocular motion.     Conjunctiva/sclera: Conjunctivae normal.  Cardiovascular:     Rate and Rhythm: Normal rate.  Pulmonary:     Effort: Pulmonary effort is normal.  Musculoskeletal:     Cervical back: Normal range of motion and neck supple.  Lymphadenopathy:     Cervical: No cervical adenopathy.  Skin:    General: Skin is warm and dry.  Neurological:     General: No focal deficit present.     Mental Status: Peggy Church is alert and oriented to person, place, and time.     Cranial Nerves: No cranial nerve deficit.     Motor: No weakness.     Coordination: Coordination normal.     Gait: Gait normal.  Psychiatric:        Mood and Affect: Mood normal.        Behavior: Behavior normal.     Assessment and Plan :   PDMP not reviewed this encounter.  1. Viral sinusitis   2. Allergic rhinitis, unspecified seasonality, unspecified trigger    Unremarkable ENT exam.  Patient has underlying allergic rhinitis, likely eustachian tube dysfunction.  Advised that Peggy Church current symptoms may be exacerbated by a viral sinusitis.  Patient would prefer to be aggressive in management given Peggy Church ear discomfort and therefore recommended an oral prednisone course, Zyrtec, supportive care.  Low suspicion for an acute encephalopathy.  Counseled patient on  potential for adverse effects with medications prescribed/recommended today, ER and return-to-clinic precautions discussed, patient verbalized understanding.    Wallis Bamberg, [place] 10/29/22 1310

## 2022-10-29 NOTE — ED Triage Notes (Addendum)
Pt c/o "soreness to both ears", echoes left ear, head pressure x 4 days-NAD-steady gait

## 2022-12-14 ENCOUNTER — Encounter: Payer: Self-pay | Admitting: Family Medicine

## 2022-12-14 ENCOUNTER — Ambulatory Visit: Payer: Managed Care, Other (non HMO) | Admitting: Family Medicine

## 2022-12-14 VITALS — BP 124/78 | HR 72 | Temp 97.9°F | Ht 66.0 in | Wt 187.8 lb

## 2022-12-14 DIAGNOSIS — H938X1 Other specified disorders of right ear: Secondary | ICD-10-CM

## 2022-12-14 DIAGNOSIS — H6990 Unspecified Eustachian tube disorder, unspecified ear: Secondary | ICD-10-CM | POA: Diagnosis not present

## 2022-12-14 DIAGNOSIS — R42 Dizziness and giddiness: Secondary | ICD-10-CM | POA: Diagnosis not present

## 2022-12-14 DIAGNOSIS — R519 Headache, unspecified: Secondary | ICD-10-CM | POA: Diagnosis not present

## 2022-12-14 DIAGNOSIS — Z1211 Encounter for screening for malignant neoplasm of colon: Secondary | ICD-10-CM

## 2022-12-14 DIAGNOSIS — H6121 Impacted cerumen, right ear: Secondary | ICD-10-CM

## 2022-12-14 DIAGNOSIS — H9011 Conductive hearing loss, unilateral, right ear, with unrestricted hearing on the contralateral side: Secondary | ICD-10-CM

## 2022-12-14 MED ORDER — PREDNISONE 20 MG PO TABS
ORAL_TABLET | ORAL | 0 refills | Status: DC
Start: 2022-12-14 — End: 2023-01-10

## 2022-12-14 NOTE — Progress Notes (Signed)
Subjective:  Patient ID: Peggy Church, female    DOB: Mar 31, 1956  Age: 67 y.o. MRN: [id number]  CC:  Chief Complaint  Patient presents with   Ear Fullness    Pt reports fullness/pressure in both ears, notes some ringing in the ears or echoing effect, notes pressure also in crown of her head, pressure under the eyes, has been progressing for about a month was given steroid from urgent care and notes that helped till it ran out then it started worsening again     HPI Peggy Church presents for   Ear/head pressure  Chart reviewed, urgent care [facility] note on June 22 4-day history of fullness in her ears, echoing of left ear, head pressure left side in throat drainage.  History of allergic rhinitis, was not taking antihistamine consistently.  Reported unremarkable ENT exam at that time.  Thought to have component of eustachian tube dysfunction with underlying allergic rhinitis.  Possible viral sinusitis.  Treated with oral prednisone course, 40 mg daily x 5 days.  Zyrtec, supportive care.  Reports she has some improvement when taking prednisone, and back on zyrtec (made tired, even with nighttime dose) then symptoms restarted after completion of prednisone - still taking zyrtec  and otc chlorpheniramine?  Still having some pressure in her head, under the eyes, both ears, with ring/echo fact.  Pressure in R ear, decreased hearing in R ear - past few days. Prior was worse in left ear - had echo in that ear - now normal hearing in left ear.  No new ear d/c. Cleaned with peroxide few days ago. No change.  Clear nasal d/c.  No nasal sprays.  No fever  Cough wit PND only. No vision changes.  episodic dizziness/unsteady.  Did travel to mountains few times prior to symptoms, trip to [place] planned in September.    Depression screen - denies depression - only concerned about ear.     12/14/2022    3:46 PM 10/15/2021    8:28 AM 08/23/2021    3:57 PM 12/16/2020   10:43 AM 06/12/2020    11:57 AM  Depression screen PHQ 2/9  Decreased Interest 0 0 0 0 0  Down, Depressed, Hopeless 0 0 0 1 0  PHQ - 2 Score 0 0 0 1 0  Altered sleeping 0 1  0   Tired, decreased energy 3 0  0   Change in appetite 2 0  0   Feeling bad or failure about yourself  0 0  0   Trouble concentrating 1 1  0   Moving slowly or fidgety/restless 0 0  0   Suicidal thoughts 0 0  0   PHQ-9 Score 6 2  1    Difficult doing work/chores  Not difficult at all         History Patient Active Problem List   Diagnosis Date Noted   BMI 31.0-31.9,adult 11/05/2015   HTN (hypertension) 06/26/2012   Past Medical History:  Diagnosis Date   Allergy    Hypertension    Migraine    Past Surgical History:  Procedure Laterality Date   BLADDER SURGERY  1963   stretched   DENTAL SURGERY     ECTOPIC PREGNANCY SURGERY     TONSILLECTOMY AND ADENOIDECTOMY     TUBAL LIGATION     Allergies  Allergen Reactions   Benicar [Olmesartan]     Smells and senses things that aren't really there.   Lisinopril     cough   Statins  Other (See Comments)    Myalgia, muscle weakness - lipitor.    Sulfa Drugs Cross Reactors    Prior to Admission medications   Medication Sig Start Date End Date Taking? Authorizing Provider  acetaminophen (TYLENOL) 325 MG tablet Take 2 tablets (650 mg total) by mouth every 6 (six) hours as needed for moderate pain. 10/29/22  Yes Wallis Bamberg, PA-C  acyclovir (ZOVIRAX) 200 MG capsule Take 4 capsules (800 mg total) by mouth 2 (two) times daily. For 5 days with outbreak. 05/11/22  Yes Shade Flood, MD  amLODipine (NORVASC) 5 MG tablet Take 1 & 1/2 tablets (7.5 mg total) by mouth daily. 09/08/22  Yes Sheliah Hatch, MD  cephALEXin (KEFLEX) 500 MG capsule Take 1 capsule (500 mg total) by mouth 2 (two) times daily. 10/06/22  Yes Wallis Bamberg, PA-C  cetirizine (ZYRTEC ALLERGY) 10 MG tablet Take 1 tablet (10 mg total) by mouth daily. 10/29/22  Yes Wallis Bamberg, PA-C  fluconazole (DIFLUCAN) 150 MG tablet  Take 1 tablet (150 mg total) by mouth once a week. 10/06/22  Yes Wallis Bamberg, PA-C  Multiple Vitamin (MULTI-VITAMINS) TABS Take by mouth daily.   Yes [provider]  nitrofurantoin, macrocrystal-monohydrate, (MACROBID) 100 MG capsule Take 1 capsule (100 mg total) by mouth 2 (two) times daily. 10/15/21  Yes Shade Flood, MD  phenazopyridine (PYRIDIUM) 200 MG tablet Take 1 tablet (200 mg total) by mouth 3 (three) times daily as needed for pain. 10/06/22  Yes Wallis Bamberg, PA-C  potassium chloride SA (KLOR-CON) 20 MEQ tablet TAKE 1 TABLET BY MOUTH TWICE DAILY (NEED TO SCHEDULE 6 MONTH APPT IN MAY) 08/02/19  Yes Shade Flood, MD  predniSONE (DELTASONE) 20 MG tablet Take 2 tablets daily with breakfast. 10/29/22  Yes Wallis Bamberg, PA-C  triamcinolone cream (KENALOG) 0.1 % Apply 1 Application topically 2 (two) times daily. 10/06/22  Yes Wallis Bamberg, PA-C  amoxicillin (AMOXIL) 500 MG capsule Take 1 capsule by mouth 3 times a day. Start 2 days before surgery. Patient not taking: Reported on 10/15/2021 09/08/21      Social History   Socioeconomic History   Marital status: Divorced    Spouse name: n/a   Number of children: 3   Years of education: college   Highest education level: Not on file  Occupational History   Occupation: Geophysicist/field seismologist    Comment: [place]  Tobacco Use   Smoking status: Former    Current packs/day: 0.00    Types: Cigarettes    Quit date: 07/18/1991    Years since quitting: 31.4   Smokeless tobacco: Never  Vaping Use   Vaping status: Never Used  Substance and Sexual Activity   Alcohol use: Yes    Alcohol/week: 0.0 standard drinks of alcohol    Comment: rarely   Drug use: No   Sexual activity: Not on file  Other Topics Concern   Not on file  Social History Narrative   Lives with her daughter and fiance.   Right handed   Lives in 2 story home.   Social Determinants of Health   Financial Resource Strain: Not on file  Food Insecurity: Not on file   Transportation Needs: Not on file  Physical Activity: Not on file  Stress: Not on file  Social Connections: Unknown (09/20/2021)   Received from [facility]   Social Network    Social Network: Not on file  Intimate Partner Violence: Unknown (08/12/2021)   [facility]   HITS  Physically Hurt: Not on file    Insult or Talk Down To: Not on file    Threaten Physical Harm: Not on file    Scream or Curse: Not on file    Review of Systems Per HPI.   Objective:   Vitals:   12/14/22 1532  BP: 124/78  Pulse: 72  Temp: 97.9 F (36.6 C)  TempSrc: Temporal  SpO2: 99%  Weight: 187 lb 12.8 oz (85.2 kg)  Height: 5\' 6"  (1.676 m)     Physical Exam Vitals reviewed.  Constitutional:      General: She is not in acute distress.    Appearance: She is well-developed.  HENT:     Head: Normocephalic and atraumatic.     Right Ear: Hearing, tympanic membrane, ear canal and external ear normal. There is impacted cerumen.     Left Ear: Hearing, tympanic membrane, ear canal and external ear normal.     Ears:     Comments: Reports normal hearing on the left, canal clear.  External pinna nontender.  Right canal obstructed with dark cerumen, removed per procedure note.  Pinna nontender.  No soft tissue swelling, mastoid nontender.    Nose:     Comments: Edematous turbinates right greater than left.  Frontal and maxillary sinuses tender.  No discharge from nares.    Mouth/Throat:     Pharynx: No posterior oropharyngeal erythema.  Eyes:     Conjunctiva/sclera: Conjunctivae normal.     Pupils: Pupils are equal, round, and reactive to light.     Comments: 1-2 beats horizontal nystagmus, reproduction of some dizziness symptoms EOM testing.  No vertical or rotational component.  Cardiovascular:     Rate and Rhythm: Normal rate and regular rhythm.     Heart sounds: Normal heart sounds. No murmur heard. Pulmonary:     Effort: Pulmonary effort is normal. No respiratory distress.      Breath sounds: Normal breath sounds. No wheezing or rhonchi.  Skin:    General: Skin is warm and dry.     Findings: No rash.  Neurological:     Mental Status: She is alert and oriented to person, place, and time.  Psychiatric:        Mood and Affect: Mood normal.        Behavior: Behavior normal.      Risks (including but not limited to bleeding, infection, likely TM perforation), benefits, and alternatives discussed for R cerumen removal, initially with curette, then lavage.  Verbal consent obtained after any questions were answered.  Lighted ear curette used to remove thick dark cerumen from external portion of canal and right, then residual amount of cerumen obstructing the TM but deep within canal, due to location changed to lavage for remaining amount.  Additional cerumen removed with lavage, but some discomfort with procedure, stopped additional lavage.  Still some obstruction of canal proximal, unable to completely visualize TM.  No bleeding, visualized portion of the canal without injury.  No discharge.  Ear still feels blocked.  Weber testing, localized to right ear. Rinne -bone conduction greater than air conduction.   Assessment & Plan:  Peggy Church is a 67 y.o. female . Sinus headache - Plan: predniSONE (DELTASONE) 20 MG tablet, Ambulatory referral to ENT  Ear pressure, right - Plan: predniSONE (DELTASONE) 20 MG tablet, Ambulatory referral to ENT  Dysfunction of Eustachian tube, unspecified laterality - Plan: predniSONE (DELTASONE) 20 MG tablet, Ambulatory referral to ENT  Dizziness - Plan: predniSONE (DELTASONE) 20 MG tablet, Ambulatory  referral to ENT  Impacted cerumen of right ear - Plan: Ear wax removal, Ambulatory referral to ENT  Screen for colon cancer - Plan: Ambulatory referral to Gastroenterology  Conductive hearing loss of right ear with unrestricted hearing of left ear - Plan: predniSONE (DELTASONE) 20 MG tablet, Ambulatory referral to ENT  As above  initial evaluations urgent care in June.  Temporary improvement in symptoms then recurred, now primarily right sided ear issues when he has been more left previously.  Appears to have multiple contributing factors including cerumen impaction on the right ear, unable to completely clear in office, procedure stopped due to discomfort but no apparent injury seen.  With sinus pressure suspect sinusitis component, eustachian tube dysfunction with prior travel to the mountains.  Intermittent dizziness without nausea or vomiting, differential includes mild vestibular neuronitis versus labyrinthitis with hearing change.  -No apparent infectious symptoms at this time, hold on antibiotics  -Will have her start Flonase nasal spray for allergic component, restart prednisone taper for sinus pressure and possible vestibular neuritis.  Potential side effects discussed, and refer to ENT for repeat exam and adjustment of plan if needed  -Can switch from Zyrtec to Claritin if less sedating.  Do not combine with other over-the-counter antihistamine.  -ER/RTC precautions given. Plan discussed on phone after visit.   Meds ordered this encounter  Medications   predniSONE (DELTASONE) 20 MG tablet    Sig: 3 by mouth for 2 days, then 2 by mouth for 2 days, then 1 by mouth for 2 days, then 1/2 by mouth for 2 days.    Dispense:  13 tablet    Refill:  0   There are no Patient Instructions on file for this visit.    Signed,   Meredith Staggers, MD [facility], [facility] 12/14/22 5:30 PM

## 2022-12-14 NOTE — Patient Instructions (Addendum)
Thank you for coming in today.  As we discussed there could be multiple contributors to your symptoms.  Allergies with sinus pressure, eustachian tube dysfunction from traveling in the mountains, blocked ear canal from wax, or inflammation around the nerve inside the ear can also be contributing.  Since the prednisone helped before I think we can try that again, switch Zyrtec to Claritin to see if that is less sedating, start Flonase over-the-counter, and I will refer you to ear nose and throat specialist.  If any new or worsening symptoms please be seen.  Let me know if you have questions.  Dr. Neva Seat

## 2022-12-29 ENCOUNTER — Other Ambulatory Visit (HOSPITAL_COMMUNITY): Payer: Self-pay

## 2022-12-29 ENCOUNTER — Other Ambulatory Visit: Payer: Self-pay | Admitting: Family Medicine

## 2022-12-29 DIAGNOSIS — I10 Essential (primary) hypertension: Secondary | ICD-10-CM

## 2022-12-29 NOTE — Telephone Encounter (Signed)
Last office visit 10/15/2021 No upcoming appt. Last refill 09/08/2022

## 2022-12-30 ENCOUNTER — Other Ambulatory Visit: Payer: Self-pay

## 2023-01-02 ENCOUNTER — Other Ambulatory Visit: Payer: Self-pay

## 2023-01-02 ENCOUNTER — Emergency Department (HOSPITAL_COMMUNITY)
Admission: EM | Admit: 2023-01-02 | Discharge: 2023-01-02 | Disposition: A | Discharge status: Home / Self Care | Payer: Managed Care, Other (non HMO) | Attending: Emergency Medicine | Admitting: Emergency Medicine

## 2023-01-02 ENCOUNTER — Encounter (HOSPITAL_COMMUNITY): Payer: Self-pay

## 2023-01-02 DIAGNOSIS — M5442 Lumbago with sciatica, left side: Secondary | ICD-10-CM | POA: Insufficient documentation

## 2023-01-02 DIAGNOSIS — M5432 Sciatica, left side: Secondary | ICD-10-CM

## 2023-01-02 DIAGNOSIS — M25552 Pain in left hip: Secondary | ICD-10-CM | POA: Diagnosis present

## 2023-01-02 MED ORDER — LIDOCAINE 5 % EX PTCH: 1.0000 | MEDICATED_PATCH | CUTANEOUS | 0 refills | Status: DC

## 2023-01-02 MED ORDER — PREDNISONE 10 MG PO TABS: 20.0000 mg | ORAL_TABLET | Freq: Every day | ORAL | 0 refills | Status: AC

## 2023-01-02 NOTE — ED Triage Notes (Signed)
Pt c/o left hip painx3d. Pt denies injury. Pt c/o tingling in left leg. Pt walked well to hall bed from triage.

## 2023-01-02 NOTE — Discharge Instructions (Signed)
Return for any problem.  ?

## 2023-01-02 NOTE — ED Notes (Signed)
ED Provider at bedside. 

## 2023-01-02 NOTE — ED Provider Notes (Signed)
[place] [facility] AT [facility] Provider Note   CSN: [id number] Arrival date & time: 01/02/23  7846     History  Chief Complaint  Patient presents with   Hip Pain    Peggy Church is a 67 y.o. female.  67 year old female with prior medical history as detailed below presents for evaluation.  Patient presents with complaint of left posterior hip and low back pain.  Patient reports that approximately 5 days ago she lifted a heavy concrete birdbath.  Approximately 3 days ago she started to experience mild left-sided low back and left posterior hip pain.  She describes pain in in this area with some radiation down into the left leg.  She reports some tingling that extends down to the left knee.  She denies any difficulty ambulating.  She denies loss of strength in the left lower extremity.  She denies prior history of sciatica.  She denies associated fever, nausea, vomiting, weakness, difficulty with urination, difficulty with bowel movements.  Patient was placed in a hallway bed for evaluation.  She declined to take off her pants for exam.  The history is provided by the patient and medical records.       Home Medications Prior to Admission medications   Medication Sig Start Date End Date Taking? Authorizing Provider  acetaminophen (TYLENOL) 325 MG tablet Take 2 tablets (650 mg total) by mouth every 6 (six) hours as needed for moderate pain. 10/29/22   Wallis Bamberg, PA-C  acyclovir (ZOVIRAX) 200 MG capsule Take 4 capsules (800 mg total) by mouth 2 (two) times daily. For 5 days with outbreak. 05/11/22   Shade Flood, MD  amLODipine (NORVASC) 5 MG tablet Take 1 & 1/2 tablets (7.5 mg total) by mouth daily. 09/08/22   Sheliah Hatch, MD  cephALEXin (KEFLEX) 500 MG capsule Take 1 capsule (500 mg total) by mouth 2 (two) times daily. 10/06/22   Wallis Bamberg, PA-C  cetirizine (ZYRTEC ALLERGY) 10 MG tablet Take 1 tablet (10 mg total) by mouth daily. 10/29/22   Wallis Bamberg, PA-C  fluconazole (DIFLUCAN) 150 MG tablet Take 1 tablet (150 mg total) by mouth once a week. 10/06/22   Wallis Bamberg, PA-C  Multiple Vitamin (MULTI-VITAMINS) TABS Take by mouth daily.    [provider]  nitrofurantoin, macrocrystal-monohydrate, (MACROBID) 100 MG capsule Take 1 capsule (100 mg total) by mouth 2 (two) times daily. 10/15/21   Shade Flood, MD  phenazopyridine (PYRIDIUM) 200 MG tablet Take 1 tablet (200 mg total) by mouth 3 (three) times daily as needed for pain. 10/06/22   Wallis Bamberg, PA-C  potassium chloride SA (KLOR-CON) 20 MEQ tablet TAKE 1 TABLET BY MOUTH TWICE DAILY (NEED TO SCHEDULE 6 MONTH APPT IN MAY) 08/02/19   Shade Flood, MD  predniSONE (DELTASONE) 20 MG tablet 3 by mouth for 2 days, then 2 by mouth for 2 days, then 1 by mouth for 2 days, then 1/2 by mouth for 2 days. 12/14/22   Shade Flood, MD  triamcinolone cream (KENALOG) 0.1 % Apply 1 Application topically 2 (two) times daily. 10/06/22   Wallis Bamberg, PA-C      Allergies    Benicar [olmesartan], Lisinopril, Statins, and Sulfa drugs cross reactors    Review of Systems   Review of Systems  All other systems reviewed and are negative.   Physical Exam Updated Vital Signs BP (!) 150/78 (BP Location: Right Arm)   Pulse 67   Temp 99 F (37.2 C) (  Oral)   Resp 20   Ht 5\' 6"  (1.676 m)   Wt 85.2 kg   SpO2 100%   BMI 30.32 kg/m  Physical Exam Vitals and nursing note reviewed.  Constitutional:      General: She is not in acute distress.    Appearance: Normal appearance. She is well-developed.  HENT:     Head: Normocephalic and atraumatic.  Eyes:     Conjunctiva/sclera: Conjunctivae normal.     Pupils: Pupils are equal, round, and reactive to light.  Cardiovascular:     Rate and Rhythm: Normal rate and regular rhythm.     Heart sounds: Normal heart sounds.  Pulmonary:     Effort: Pulmonary effort is normal. No respiratory distress.     Breath sounds: Normal breath sounds.   Abdominal:     General: There is no distension.     Palpations: Abdomen is soft.     Tenderness: There is no abdominal tenderness.  Musculoskeletal:        General: No deformity. Normal range of motion.     Cervical back: Normal range of motion and neck supple.     Comments: Patient localizes maximal pain to the left low paraspinal musculature.  Patient is ambulatory without antalgic gait.  Patient with 5 out of 5 strength in both lower extremities.  Patient is neurovascularly intact to both lower extremities.  Skin:    General: Skin is warm and dry.  Neurological:     General: No focal deficit present.     Mental Status: She is alert and oriented to person, place, and time.     ED Results / Procedures / Treatments   Labs (all labs ordered are listed, but only abnormal results are displayed) Labs Reviewed - No data to display  EKG None  Radiology No results found.  Procedures Procedures    Medications Ordered in ED Medications - No data to display  ED Course/ Medical Decision Making/ A&P                                 Medical Decision Making   Medical Screen Complete  This patient presented to the ED with complaint of low back pain  This complaint involves an extensive number of treatment options. The initial differential diagnosis includes, but is not limited to, sciatica, muscular strain, etc.  This presentation is: Acute, Self-Limited, Previously Undiagnosed, and Uncertain Prognosis  Patient is presenting with complaint of low back discomfort.  Patient reports that pain began after heavy lifting.  History and exam are most consistent with likely mild to moderate sciatica.  Patient offered imaging but she declines.  Patient is agreeable with a trial of both ibuprofen, Lidoderm patches, and short steroid burst.    She has already established follow-up appointment with her PCP in 2 days.  She plans on keeping this appointment.  Patient educated about  management of likely sciatica.  Patient given strict return precautions.  Importance of close follow-up is repeatedly stressed.  Additional history obtained: External records from outside sources obtained and reviewed including prior ED visits and prior Inpatient records.   Problem List / ED Course:  Low back pain   Reevaluation:  After the interventions noted above, I reevaluated the patient and found that they have: improved Disposition:  After consideration of the diagnostic results and the patients response to treatment, I feel that the patent would benefit from close outpatient follow-up.  Final Clinical Impression(s) / ED Diagnoses Final diagnoses:  Sciatica of left side    Rx / DC Orders ED Discharge Orders          Ordered    predniSONE (DELTASONE) 10 MG tablet  Daily        01/02/23 0737    lidocaine (LIDODERM) 5 %  Every 24 hours        01/02/23 0737              Wynetta Fines, MD 01/02/23 [id number]

## 2023-01-04 ENCOUNTER — Encounter: Payer: Self-pay | Admitting: Family Medicine

## 2023-01-04 ENCOUNTER — Ambulatory Visit (INDEPENDENT_AMBULATORY_CARE_PROVIDER_SITE_OTHER): Payer: Managed Care, Other (non HMO) | Admitting: Family Medicine

## 2023-01-04 VITALS — BP 128/78 | HR 63 | Temp 98.2°F | Ht 66.0 in | Wt 193.0 lb

## 2023-01-04 DIAGNOSIS — H938X1 Other specified disorders of right ear: Secondary | ICD-10-CM | POA: Diagnosis not present

## 2023-01-04 DIAGNOSIS — E782 Mixed hyperlipidemia: Secondary | ICD-10-CM | POA: Diagnosis not present

## 2023-01-04 DIAGNOSIS — I10 Essential (primary) hypertension: Secondary | ICD-10-CM | POA: Diagnosis not present

## 2023-01-04 DIAGNOSIS — M5432 Sciatica, left side: Secondary | ICD-10-CM

## 2023-01-04 LAB — COMPREHENSIVE METABOLIC PANEL
ALT: 15 U/L (ref 0–35)
AST: 15 U/L (ref 0–37)
Albumin: 4.4 g/dL (ref 3.5–5.2)
Alkaline Phosphatase: 78 U/L (ref 39–117)
BUN: 16 mg/dL (ref 6–23)
CO2: 26 mEq/L (ref 19–32)
Calcium: 9.3 mg/dL (ref 8.4–10.5)
Chloride: 104 mEq/L (ref 96–112)
Creatinine, Ser: 0.82 mg/dL (ref 0.40–1.20)
GFR: 74.51 mL/min (ref >60.00)
Glucose, Bld: 104 mg/dL — ABNORMAL HIGH (ref 70–99)
Potassium: 4.1 mEq/L (ref 3.5–5.1)
Sodium: 141 mEq/L (ref 135–145)
Total Bilirubin: 0.8 mg/dL (ref 0.2–1.2)
Total Protein: 6.5 g/dL (ref 6.0–8.3)

## 2023-01-04 LAB — LIPID PANEL
Cholesterol: 348 mg/dL — ABNORMAL HIGH (ref 0–200)
HDL: 65.5 mg/dL (ref >39.00)
LDL Cholesterol: 254 mg/dL — ABNORMAL HIGH (ref 0–99)
NonHDL: 282.6
Total CHOL/HDL Ratio: 5
Triglycerides: 145 mg/dL (ref 0.0–149.0)
VLDL: 29 mg/dL (ref 0.0–40.0)

## 2023-01-04 MED ORDER — AMLODIPINE BESYLATE 5 MG PO TABS: 7.5000 mg | ORAL_TABLET | Freq: Every day | ORAL | 0 refills | Status: DC

## 2023-01-04 NOTE — Progress Notes (Signed)
Subjective:  Patient ID: Peggy Church, female    DOB: [birth year]  Age: 67 y.o. MRN: [id number]  CC:  Chief Complaint  Patient presents with   Medical Management of Chronic Issues    Needs BP meds refilled at this time    Hip Pain    Pt notes back pain, and hip pain Lt side starting about wed last week, notes is a bit better today     HPI Peggy Church presents for   Follow-up, last visit August 7.  Ear concerns discussed at that time, cerumen impaction with incomplete clearing in office, sinusitis component, eustachian tube dysfunction component, possible labyrinthitis, vestibular neuronitis.  No apparent infection, held on antibiotics, started on Flonase nasal spray for allergic component, prednisone taper for sinus pressure and possible vestibular neuritis.  Referred to ENT.  Switch from Zyrtec to Claritin to see if that were less sedating.  Appt with ENT 9/13. Ears are better. Some crackling. No new pain, hearing improving  On claritin and flonase - seems to be improving.   Hypertension: Treated with amlodipine total dose of 7.5 mg daily.  Has taken potassium supplementation once per month. Heart rate elevated in the 90's with prednisone.  Home readings: 120/70 range.  BP Readings from Last 3 Encounters:  01/04/23 128/78  01/02/23 (!) 150/78  12/14/22 124/78   Lab Results  Component Value Date   CREATININE 0.85 11/04/2020   Lab Results  Component Value Date   K 3.7 11/04/2020   Hyperlipidemia: Has declined meds. No history of MI/CVA. Father with CABG in his 9's.  No early cardiac disease in family.  Had myalgias with prior statin, flushing with niacin.  Lab Results  Component Value Date   CHOL 340 (H) 11/04/2020   HDL 58.20 11/04/2020   LDLCALC 279 (H) 04/03/2019   LDLDIRECT 247.0 11/04/2020   TRIG 280.0 (H) 11/04/2020   CHOLHDL 6 11/04/2020   Lab Results  Component Value Date   ALT 10 11/04/2020   AST 13 11/04/2020   ALKPHOS 105 11/04/2020   BILITOT 0.9  11/04/2020      Left-sided sciatica ER visit noted from 2 days ago.Presented with left posterior hip and low back pain, had lifted heavy object 5 days prior then 3 days prior left low back pain and posterior hip pain.  Radiation into the left leg.  No weakness, no urinary or bowel symptoms.  Diagnosed with mild to moderate sciatica.  Imaging was declined.  Trial of ibuprofen, Lidoderm patches and short steroid burst.  40 mg prednisone daily x 5 days.  Feeling better. Less sore today - a lot better. No bowel or bladder incontinence, no saddle anesthesia, no lower extremity weakness. No fever. No abd pain.  Took few ibuprofen. Aspercreme helps.    History Patient Active Problem List   Diagnosis Date Noted   BMI 31.0-31.9,adult 11/05/2015   HTN (hypertension) 06/26/2012   Past Medical History:  Diagnosis Date   Allergy    Hypertension    Migraine    Past Surgical History:  Procedure Laterality Date   BLADDER SURGERY  1963   stretched   DENTAL SURGERY     ECTOPIC PREGNANCY SURGERY     TONSILLECTOMY AND ADENOIDECTOMY     TUBAL LIGATION     Allergies  Allergen Reactions   Benicar [Olmesartan]     Smells and senses things that aren't really there.   Lisinopril     cough   Statins Other (See Comments)    Myalgia,  muscle weakness - lipitor.    Sulfa Drugs Cross Reactors    Prior to Admission medications   Medication Sig Start Date End Date Taking? Authorizing Provider  acetaminophen (TYLENOL) 325 MG tablet Take 2 tablets (650 mg total) by mouth every 6 (six) hours as needed for moderate pain. 10/29/22  Yes Wallis Bamberg, PA-C  acyclovir (ZOVIRAX) 200 MG capsule Take 4 capsules (800 mg total) by mouth 2 (two) times daily. For 5 days with outbreak. 05/11/22  Yes Shade Flood, MD  amLODipine (NORVASC) 5 MG tablet Take 1 & 1/2 tablets (7.5 mg total) by mouth daily. 09/08/22  Yes Sheliah Hatch, MD  cetirizine (ZYRTEC ALLERGY) 10 MG tablet Take 1 tablet (10 mg total) by mouth  daily. 10/29/22  Yes Wallis Bamberg, PA-C  fluconazole (DIFLUCAN) 150 MG tablet Take 1 tablet (150 mg total) by mouth once a week. 10/06/22  Yes Wallis Bamberg, PA-C  lidocaine (LIDODERM) 5 % Place 1 patch onto the skin daily. Remove & Discard patch within 12 hours or as directed by MD 01/02/23  Yes Wynetta Fines, MD  Multiple Vitamin (MULTI-VITAMINS) TABS Take by mouth daily.   Yes [provider]  nitrofurantoin, macrocrystal-monohydrate, (MACROBID) 100 MG capsule Take 1 capsule (100 mg total) by mouth 2 (two) times daily. 10/15/21  Yes Shade Flood, MD  phenazopyridine (PYRIDIUM) 200 MG tablet Take 1 tablet (200 mg total) by mouth 3 (three) times daily as needed for pain. 10/06/22  Yes Wallis Bamberg, PA-C  potassium chloride SA (KLOR-CON) 20 MEQ tablet TAKE 1 TABLET BY MOUTH TWICE DAILY (NEED TO SCHEDULE 6 MONTH APPT IN MAY) 08/02/19  Yes Shade Flood, MD  predniSONE (DELTASONE) 10 MG tablet Take 2 tablets (20 mg total) by mouth daily for 5 days. 01/02/23 01/07/23 Yes Messick, Noralyn Pick, MD  predniSONE (DELTASONE) 20 MG tablet 3 by mouth for 2 days, then 2 by mouth for 2 days, then 1 by mouth for 2 days, then 1/2 by mouth for 2 days. 12/14/22  Yes Shade Flood, MD  triamcinolone cream (KENALOG) 0.1 % Apply 1 Application topically 2 (two) times daily. 10/06/22  Yes Wallis Bamberg, PA-C  cephALEXin (KEFLEX) 500 MG capsule Take 1 capsule (500 mg total) by mouth 2 (two) times daily. Patient not taking: Reported on 01/04/2023 10/06/22   Wallis Bamberg, PA-C   Social History   Socioeconomic History   Marital status: Divorced    Spouse name: n/a   Number of children: 3   Years of education: college   Highest education level: Not on file  Occupational History   Occupation: teaching assistant    Comment[place]  Tobacco Use   Smoking status: Former    Current packs/day: 0.00    Types: Cigarettes    Quit date: 07/18/1991    Years since quitting: 31.4   Smokeless tobacco: Never  Vaping  Use   Vaping status: Never Used  Substance and Sexual Activity   Alcohol use: Yes    Alcohol/week: 0.0 standard drinks of alcohol    Comment: rarely   Drug use: No   Sexual activity: Not on file  Other Topics Concern   Not on file  Social History Narrative   Lives with her daughter and fiance.   Right handed   Lives in 2 story home.   Social Determinants of Health   Financial Resource Strain: Not on file  Food Insecurity: Not on file  Transportation Needs: Not on file  Physical Activity:  Not on file  Stress: Not on file  Social Connections: Unknown (09/20/2021)   Received from [facility]   Social Network    Social Network: Not on file  Intimate Partner Violence: Unknown (08/12/2021)   Received from [facility]   HITS    Physically Hurt: Not on file    Insult or Talk Down To: Not on file    Threaten Physical Harm: Not on file    Scream or Curse: Not on file    Review of Systems  Constitutional:  Negative for fatigue and unexpected weight change.  Respiratory:  Negative for chest tightness and shortness of breath.   Cardiovascular:  Negative for chest pain, palpitations and leg swelling.  Gastrointestinal:  Negative for abdominal pain and blood in stool.  Neurological:  Negative for dizziness, syncope, light-headedness and headaches.     Objective:   Vitals:   01/04/23 0947  BP: 128/78  Pulse: 63  Temp: 98.2 F (36.8 C)  TempSrc: Temporal  SpO2: 98%  Weight: 193 lb (87.5 kg)  Height: 5\' 6"  (1.676 m)     Physical Exam Vitals reviewed.  Constitutional:      Appearance: Normal appearance. She is well-developed.  HENT:     Head: Normocephalic and atraumatic.  Eyes:     Conjunctiva/sclera: Conjunctivae normal.     Pupils: Pupils are equal, round, and reactive to light.  Neck:     Vascular: No carotid bruit.  Cardiovascular:     Rate and Rhythm: Normal rate and regular rhythm.     Heart sounds: Normal heart sounds.  Pulmonary:     Effort: Pulmonary  effort is normal.     Breath sounds: Normal breath sounds.  Abdominal:     Palpations: Abdomen is soft. There is no pulsatile mass.     Tenderness: There is no abdominal tenderness.  Musculoskeletal:     Right lower leg: No edema.     Left lower leg: No edema.     Comments: Slight discomfort of the lower left paraspinal muscles of lumbar spine, no midline bony tenderness, negative seated straight leg raise bilaterally, ambulating without assistive device.  Skin:    General: Skin is warm and dry.  Neurological:     Mental Status: She is alert and oriented to person, place, and time.  Psychiatric:        Mood and Affect: Mood normal.        Behavior: Behavior normal.        Assessment & Plan:  Peggy Church is a 67 y.o. female . Essential hypertension - Plan: amLODipine (NORVASC) 5 MG tablet, Comprehensive metabolic panel  -  Stable, tolerating current regimen. Medications refilled. Labs pending as above.   Mixed hyperlipidemia - Plan: Lipid panel  -Suspected genetic component/familial hyperlipidemia.  Significantly high LDL in the past.  Intolerant to statins previously.  Could consider PCSK9 inhibitor, but will check labs first and then can discuss plan.  Option of lipid specialist.  Risks of untreated hyperlipidemia discussed, including with family history of cardiac disease although that was not early.  Ear pressure, right  -Ear symptoms are improving with Claritin, Flonase and has ENT follow-up as above.  Sciatica of left side  -Improved with prednisone, handout given.  RTC precautions.  Hold on imaging at this time as improving.  Meds ordered this encounter  Medications   amLODipine (NORVASC) 5 MG tablet    Sig: Take 1 & 1/2 tablets (7.5 mg total) by mouth daily.  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  We can discuss after lab results.  No change in blood pressure meds.  Glad to hear that your symptoms are improving, keep follow-up with ENT. Sciatica should continue to improve - follow up if it worsens or does not continue to improve.  Take care!  Sciatica  Sciatica is pain, numbness, weakness, or tingling along the path of the sciatic nerve. The sciatic nerve starts in the lower back and runs down the back of each leg. The nerve controls the muscles in the lower leg and in the back of the knee. It also provides feeling (sensation) to the back of the thigh, the lower leg, and the sole of the foot. Sciatica is a symptom of another medical condition that pinches or puts pressure on the sciatic nerve. Sciatica most often only affects one side of the body. Sciatica usually goes away on its own or with treatment. In some cases, sciatica may come back (recur). What are the causes? This condition is caused by pressure on the sciatic nerve or pinching of the nerve. This may be the result of: A disk in between the bones of the spine bulging out too far (herniated disk). Age-related changes in the spinal disks. A pain disorder that affects a muscle in the buttock. Extra bone growth near the sciatic nerve. A break (fracture) of the pelvis. Pregnancy. Tumor. This is rare. What increases the risk? The following factors may make you more likely to develop this condition: Playing sports that place pressure or stress on the spine. Having poor strength and flexibility. A history of back injury or surgery. Sitting for long periods of time. Doing activities that involve repetitive bending or lifting. Obesity. What are the signs or symptoms? Symptoms can vary from mild to very severe. They may include: Any of the following problems in the lower back, leg, hip, or buttock: Mild tingling, numbness, or dull aches. Burning sensations. Sharp pains. Numbness in the back of the  calf or the sole of the foot. Leg weakness. Severe back pain that makes movement difficult. Symptoms may get worse when you cough, sneeze, or laugh, or when you sit or stand for long periods of time. How is this diagnosed? This condition may be diagnosed based on: Your symptoms and medical history. A physical exam. Blood tests. Imaging tests, such as: X-rays. An MRI. A CT scan. How is this treated? In many cases, this condition improves on its own without treatment. However, treatment may include: Reducing or modifying physical activity. Exercising, including strengthening and stretching. Icing and applying heat to the affected area. Medicines that help to: Relieve pain and swelling. Relax your muscles. Injections of medicines that help to relieve pain and inflammation (steroids) around the sciatic nerve. Surgery. Follow these instructions at home: Medicines Take over-the-counter and prescription medicines only as told by your health care provider. Ask your health care provider if the medicine prescribed to you requires you to avoid driving or using heavy machinery. Managing pain     If directed, put ice on the affected area. To do this: Put ice in a plastic bag. Place a towel between your skin and the bag. Leave the ice on for 20 minutes, 2-3 times a day. If your skin turns bright  red, remove the ice right away to prevent skin damage. The risk of skin damage is higher if you cannot feel pain, heat, or cold. If directed, apply heat to the affected area as often as told by your health care provider. Use the heat source that your health care provider recommends, such as a moist heat pack or a heating pad. Place a towel between your skin and the heat source. Leave the heat on for 20-30 minutes. If your skin turns bright red, remove the heat right away to prevent burns. The risk of burns is higher if you cannot feel pain, heat, or cold. Activity  Return to your normal activities  as told by your health care provider. Ask your health care provider what activities are safe for you. Avoid activities that make your symptoms worse. Take brief periods of rest throughout the day. When you rest for longer periods, mix in some mild activity or stretching between periods of rest. This will help to prevent stiffness and pain. Avoid sitting for long periods of time without moving. Get up and move around at least one time each hour. Exercise and stretch regularly as told by your health care provider. Do not lift anything that is heavier than 10 lb (4.5 kg) until your health care provider says that it is safe. When you do not have symptoms, you should still avoid heavy lifting, especially repetitive heavy lifting. When you lift objects, always use proper lifting technique, which includes: Bending your knees. Keeping the load close to your body. Avoiding twisting. General instructions Maintain a healthy weight. Excess weight puts extra stress on your back. Wear supportive, comfortable shoes. Avoid wearing high heels. Avoid sleeping on a mattress that is too soft or too hard. A mattress that is firm enough to support your back when you sleep may help to reduce your pain. Contact a health care provider if: Your pain is not controlled by medicine. Your pain does not improve or gets worse. Your pain lasts longer than 4 weeks. You have unexplained weight loss. Get help right away if: You are not able to control when you urinate or have bowel movements (incontinence). You have: Weakness in your lower back, pelvis, buttocks, or legs that gets worse. Redness or swelling of your back. A burning sensation when you urinate. Summary Sciatica is pain, numbness, weakness, or tingling along the path of the sciatic nerve, which may include the lower back, legs, hips, and buttocks. This condition is caused by pressure on the sciatic nerve or pinching of the nerve. Treatment often includes rest,  exercise, medicines, and applying ice or heat. This information is not intended to replace advice given to you by your health care provider. Make sure you discuss any questions you have with your health care provider. Document Revised: 08/02/2021 Document Reviewed: 08/02/2021 Elsevier Patient Education  2024 [employer]        Signed,   Meredith Staggers, MD [facility], [facility] 01/04/23 10:30 AM

## 2023-01-04 NOTE — Patient Instructions (Addendum)
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  We can discuss after lab results.  No change in blood pressure meds.  Glad to hear that your symptoms are improving, keep follow-up with ENT. Sciatica should continue to improve - follow up if it worsens or does not continue to improve.  Take care!  Sciatica  Sciatica is pain, numbness, weakness, or tingling along the path of the sciatic nerve. The sciatic nerve starts in the lower back and runs down the back of each leg. The nerve controls the muscles in the lower leg and in the back of the knee. It also provides feeling (sensation) to the back of the thigh, the lower leg, and the sole of the foot. Sciatica is a symptom of another medical condition that pinches or puts pressure on the sciatic nerve. Sciatica most often only affects one side of the body. Sciatica usually goes away on its own or with treatment. In some cases, sciatica may come back (recur). What are the causes? This condition is caused by pressure on the sciatic nerve or pinching of the nerve. This may be the result of: A disk in between the bones of the spine bulging out too far (herniated disk). Age-related changes in the spinal disks. A pain disorder that affects a muscle in the buttock. Extra bone growth near the sciatic nerve. A break (fracture) of the pelvis. Pregnancy. Tumor. This is rare. What increases the risk? The following factors may make you more likely to develop this condition: Playing sports that place pressure or stress on the spine. Having poor strength and flexibility. A history of back injury or surgery. Sitting for long periods of time. Doing activities that involve repetitive bending or lifting. Obesity. What are the signs or symptoms? Symptoms can vary from mild to very severe. They may include: Any of the following problems in the  lower back, leg, hip, or buttock: Mild tingling, numbness, or dull aches. Burning sensations. Sharp pains. Numbness in the back of the calf or the sole of the foot. Leg weakness. Severe back pain that makes movement difficult. Symptoms may get worse when you cough, sneeze, or laugh, or when you sit or stand for long periods of time. How is this diagnosed? This condition may be diagnosed based on: Your symptoms and medical history. A physical exam. Blood tests. Imaging tests, such as: X-rays. An MRI. A CT scan. How is this treated? In many cases, this condition improves on its own without treatment. However, treatment may include: Reducing or modifying physical activity. Exercising, including strengthening and stretching. Icing and applying heat to the affected area. Medicines that help to: Relieve pain and swelling. Relax your muscles. Injections of medicines that help to relieve pain and inflammation (steroids) around the sciatic nerve. Surgery. Follow these instructions at home: Medicines Take over-the-counter and prescription medicines only as told by your health care provider. Ask your health care provider if the medicine prescribed to you requires you to avoid driving or using heavy machinery. Managing pain     If directed, put ice on the affected area. To do this: Put ice in a plastic bag. Place a towel between your skin and the bag. Leave the ice on for 20 minutes, 2-3 times a day. If your skin turns bright red, remove the ice right away to prevent skin damage. The risk of skin damage is  higher if you cannot feel pain, heat, or cold. If directed, apply heat to the affected area as often as told by your health care provider. Use the heat source that your health care provider recommends, such as a moist heat pack or a heating pad. Place a towel between your skin and the heat source. Leave the heat on for 20-30 minutes. If your skin turns bright red, remove the heat  right away to prevent burns. The risk of burns is higher if you cannot feel pain, heat, or cold. Activity  Return to your normal activities as told by your health care provider. Ask your health care provider what activities are safe for you. Avoid activities that make your symptoms worse. Take brief periods of rest throughout the day. When you rest for longer periods, mix in some mild activity or stretching between periods of rest. This will help to prevent stiffness and pain. Avoid sitting for long periods of time without moving. Get up and move around at least one time each hour. Exercise and stretch regularly as told by your health care provider. Do not lift anything that is heavier than 10 lb (4.5 kg) until your health care provider says that it is safe. When you do not have symptoms, you should still avoid heavy lifting, especially repetitive heavy lifting. When you lift objects, always use proper lifting technique, which includes: Bending your knees. Keeping the load close to your body. Avoiding twisting. General instructions Maintain a healthy weight. Excess weight puts extra stress on your back. Wear supportive, comfortable shoes. Avoid wearing high heels. Avoid sleeping on a mattress that is too soft or too hard. A mattress that is firm enough to support your back when you sleep may help to reduce your pain. Contact a health care provider if: Your pain is not controlled by medicine. Your pain does not improve or gets worse. Your pain lasts longer than 4 weeks. You have unexplained weight loss. Get help right away if: You are not able to control when you urinate or have bowel movements (incontinence). You have: Weakness in your lower back, pelvis, buttocks, or legs that gets worse. Redness or swelling of your back. A burning sensation when you urinate. Summary Sciatica is pain, numbness, weakness, or tingling along the path of the sciatic nerve, which may include the lower back,  legs, hips, and buttocks. This condition is caused by pressure on the sciatic nerve or pinching of the nerve. Treatment often includes rest, exercise, medicines, and applying ice or heat. This information is not intended to replace advice given to you by your health care provider. Make sure you discuss any questions you have with your health care provider. Document Revised: 08/02/2021 Document Reviewed: 08/02/2021 Elsevier Patient Education  2024 [employer].

## 2023-01-05 ENCOUNTER — Ambulatory Visit: Payer: Managed Care, Other (non HMO) | Admitting: Family Medicine

## 2023-01-08 ENCOUNTER — Other Ambulatory Visit: Payer: Self-pay | Admitting: Family Medicine

## 2023-01-08 DIAGNOSIS — R42 Dizziness and giddiness: Secondary | ICD-10-CM

## 2023-01-08 DIAGNOSIS — R519 Headache, unspecified: Secondary | ICD-10-CM

## 2023-01-08 DIAGNOSIS — H9011 Conductive hearing loss, unilateral, right ear, with unrestricted hearing on the contralateral side: Secondary | ICD-10-CM

## 2023-01-08 DIAGNOSIS — H938X1 Other specified disorders of right ear: Secondary | ICD-10-CM

## 2023-01-08 DIAGNOSIS — H6990 Unspecified Eustachian tube disorder, unspecified ear: Secondary | ICD-10-CM

## 2023-01-10 MED ORDER — PREDNISONE 20 MG PO TABS: ORAL_TABLET | ORAL | 0 refills | Status: DC

## 2023-01-10 NOTE — Telephone Encounter (Signed)
Comment from patient: On Friday I finished the prednisone I got from the emergency room and my pain level was slowly getting better, like from 7 to a 3, but after being off of it for 24 hours the pain level has started climbing and it is back up to a 6.   Pt saw you 01/04/2023 for ED follow up should pt return for a refill on this prescription or is it reasonable to send without a visit?   Please advise

## 2023-01-10 NOTE — Telephone Encounter (Signed)
If still having symptoms can continue prednisone for an additional 3 days at 40 mg then taper over the next few days.  Follow-up if return of pain after this dose or sooner if worse

## 2023-01-10 NOTE — Addendum Note (Signed)
Addended by: Meredith Staggers R on: 01/10/2023 01:37 PM   Modules accepted: Orders

## 2023-01-20 ENCOUNTER — Other Ambulatory Visit: Payer: Self-pay

## 2023-01-20 ENCOUNTER — Other Ambulatory Visit (HOSPITAL_BASED_OUTPATIENT_CLINIC_OR_DEPARTMENT_OTHER): Payer: Self-pay

## 2023-01-20 ENCOUNTER — Ambulatory Visit (INDEPENDENT_AMBULATORY_CARE_PROVIDER_SITE_OTHER): Payer: Managed Care, Other (non HMO) | Admitting: Otolaryngology

## 2023-01-20 ENCOUNTER — Other Ambulatory Visit (HOSPITAL_COMMUNITY): Payer: Self-pay

## 2023-01-20 ENCOUNTER — Encounter (INDEPENDENT_AMBULATORY_CARE_PROVIDER_SITE_OTHER): Payer: Self-pay | Admitting: Otolaryngology

## 2023-01-20 VITALS — BP 147/84 | HR 61 | Ht 66.0 in | Wt 192.2 lb

## 2023-01-20 DIAGNOSIS — R1310 Dysphagia, unspecified: Secondary | ICD-10-CM

## 2023-01-20 DIAGNOSIS — K219 Gastro-esophageal reflux disease without esophagitis: Secondary | ICD-10-CM

## 2023-01-20 DIAGNOSIS — J3089 Other allergic rhinitis: Secondary | ICD-10-CM

## 2023-01-20 DIAGNOSIS — R09A2 Foreign body sensation, throat: Secondary | ICD-10-CM

## 2023-01-20 DIAGNOSIS — R0981 Nasal congestion: Secondary | ICD-10-CM

## 2023-01-20 DIAGNOSIS — R0982 Postnasal drip: Secondary | ICD-10-CM

## 2023-01-20 DIAGNOSIS — H6123 Impacted cerumen, bilateral: Secondary | ICD-10-CM | POA: Diagnosis not present

## 2023-01-20 DIAGNOSIS — R070 Pain in throat: Secondary | ICD-10-CM | POA: Diagnosis not present

## 2023-01-20 DIAGNOSIS — H9193 Unspecified hearing loss, bilateral: Secondary | ICD-10-CM

## 2023-01-20 DIAGNOSIS — H9313 Tinnitus, bilateral: Secondary | ICD-10-CM

## 2023-01-20 MED ORDER — FAMOTIDINE 20 MG PO TABS
20.0000 mg | ORAL_TABLET | Freq: Two times a day (BID) | ORAL | 1 refills | Status: AC
  Filled 2023-01-20: qty 30, 15d supply, fill #0

## 2023-01-20 MED ORDER — FLUTICASONE PROPIONATE 50 MCG/ACT NA SUSP
2.0000 | Freq: Every day | NASAL | 6 refills | Status: AC
  Filled 2023-01-20: qty 16, 30d supply, fill #0
  Filled 2023-02-15: qty 16, 30d supply, fill #1
  Filled 2023-03-15: qty 16, 30d supply, fill #2
  Filled 2023-10-11: qty 16, 30d supply, fill #3
  Filled 2023-11-09: qty 16, 30d supply, fill #4

## 2023-01-20 MED ORDER — DESLORATADINE 5 MG PO TABS
5.0000 mg | ORAL_TABLET | Freq: Every day | ORAL | 3 refills | Status: AC
  Filled 2023-01-20: qty 90, 90d supply, fill #0
  Filled 2023-10-11 – 2023-10-12 (×2): qty 30, 30d supply, fill #1
  Filled 2023-11-09: qty 30, 30d supply, fill #2

## 2023-01-20 NOTE — Patient Instructions (Addendum)
-   Sweet Oil ear drops for cerumen  - start Famotidine and try reflux gourmet for reflux - diet and lifestyle changes to minimize reflux  - start Clarinex and Flonase - return for videostrobe for voice changes evaluation - Audiogram - swallow study  - Take Reflux Gourmet (natural supplement available on [employer]) to help with symptoms of chronic throat irritation      See information about Eustachian Tube Dysfunction below:    Overview The eustachian (say "you-STAY-shee-un") tubes connect the middle ear on each side to the back of the throat. They keep air pressure stable in the ears. If your eustachian tubes become blocked, the air pressure in your ears changes. A quick change in air pressure can cause eustachian tubes to close up. This might happen when an airplane changes altitude or when a scuba diver goes up or down underwater. And a cold can make the tubes swell and block the fluid in the middle ear from draining out. That can cause pain.  Eustachian tube problems often clear up on their own or after treating the cause of the blockage. If your tubes continue to be blocked, you may need surgery.  Follow-up care is a key part of your treatment and safety. Be sure to make and go to all appointments, and call your doctor or nurse advice line (811 in most provinces and territories) if you are having problems. It's also a good idea to know your test results and keep a list of the medicines you take.  How can you care for yourself at home? Try a simple exercise to help open blocked tubes. Close your mouth, hold your nose, and gently blow as if you are blowing your nose. Yawning and chewing gum also may help. You may hear or feel a "pop" when the tubes open. To ease ear pain, apply a warm face cloth or a heating pad set on low. There may be some drainage from the ear when the heat melts earwax. Put a cloth between the heat source and your skin. If your doctor prescribed antibiotics, take them  as directed. Do not stop taking them just because you feel better. You need to take the full course of antibiotics. Be safe with medicines. Depending on the cause of the problem, your doctor may recommend over-the-counter medicine. For example, adults may try decongestants for cold symptoms or nasal spray steroids for allergies. Follow the instructions carefully.

## 2023-01-20 NOTE — Progress Notes (Unsigned)
ENT CONSULT:  Reason for Consult: dysphagia and clogged up ears, cerumen, decreased hearing.    HPI: Peggy Church is an 67 y.o. female with hx of tonsillectomy and adenoidectomy, prior allergy testing allergic to several things seasonally per report, who is here for initial evaluation of multiple complaints.   Dysphagia  x 2 yrs She reports large pill dysphagia x 2 yrs, was taking potassium, had to start dissolving pills it and drinking the solution. Bread and rice get stuck in her throat, feels like it gets stuck at the level of sternal notch. No choking on other foods or liquids. No fevers, weight loss or aspiration PNA.  Previously on Nexium for heartburn, not any more.   2.  Chronic persistent sensation of muffled throat clogged ears with nasal congestion - for several months  She reports feeling pressure in her ears worse on the left side, was seen for this multiple times, and her symptoms improve on oral steroids thus far had 2 rounds of oral steroids, with symptom improvement initially and worsening after she finishes steroids.  She reports ear pressure and ear popping mild ear discomfort.  Currently on Flonase daily Previous dermal allergy testing, (+)ragweed, salmon, cottonwood trees, some other trees.  No dedicated sinus imaging but head CT done in 2019 with evidence of ethmoid sinus mucosal thickening.  Denies recurrent sinus infections requiring antibiotics.  Reports good sense of smell.  3.  Cerumen impaction, previously had ear lavage at PCP with incomplete removal.  Denies ear pain.  Denies dizziness or vertigo today.    Records Reviewed:  Fam Med Office Note Dr Neva Seat 01/04/23   Follow-up, last visit August 7.  Ear concerns discussed at that time, cerumen impaction with incomplete clearing in office, sinusitis component, eustachian tube dysfunction component, possible labyrinthitis, vestibular neuronitis.  No apparent infection, held on antibiotics, started on Flonase nasal  spray for allergic component, prednisone taper for sinus pressure and possible vestibular neuritis.  Referred to ENT.  Switch from Zyrtec to Claritin to see if that were less sedating.  Appt with ENT 9/13. Ears are better. Some crackling. No new pain, hearing improving  On claritin and flonase - seems to be improving.     Past Medical History:  Diagnosis Date   Allergy    Hypertension    Migraine     Past Surgical History:  Procedure Laterality Date   BLADDER SURGERY  1963   stretched   DENTAL SURGERY     ECTOPIC PREGNANCY SURGERY     TONSILLECTOMY AND ADENOIDECTOMY     TUBAL LIGATION      Family History  Problem Relation Age of Onset   Hyperlipidemia Mother    Hypertension Mother    Stroke Mother    Heart disease Mother    Cancer Father        prostate   Hyperlipidemia Father    Hypertension Father    Cancer Brother     Social History:  reports that she quit smoking about 31 years ago. Her smoking use included cigarettes. She has never used smokeless tobacco. She reports current alcohol use. She reports that she does not use drugs.  Allergies:  Allergies  Allergen Reactions   Benicar [Olmesartan]     Smells and senses things that aren't really there.   Lisinopril     cough   Statins Other (See Comments)    Myalgia, muscle weakness - lipitor.    Sulfa Drugs Cross Reactors     Medications: I have  reviewed the patient's current medications.  The PMH, PSH, Medications, Allergies, and SH were reviewed and updated.  ROS: Constitutional: Negative for fever, weight loss and weight gain. Cardiovascular: Negative for chest pain and dyspnea on exertion. Respiratory: Is not experiencing shortness of breath at rest. Gastrointestinal: Negative for nausea and vomiting. Neurological: Negative for headaches. Psychiatric: The patient is not nervous/anxious  Blood pressure (!) 147/84, pulse 61, height 5\' 6"  (1.676 m), weight 192 lb 3.2 oz (87.2 kg), SpO2 99%.  PHYSICAL  EXAM:  Exam: General: Well-developed, well-nourished Respiratory Respiratory effort: Equal inspiration and expiration without stridor Cardiovascular Peripheral Vascular: Warm extremities with equal color/perfusion Eyes: No nystagmus with equal extraocular motion bilaterally Neuro/Psych/Balance: Patient oriented to person, place, and time; Appropriate mood and affect; Gait is intact with no imbalance; Cranial nerves I-XII are intact Head and Face Inspection: Normocephalic and atraumatic without mass or lesion Palpation: Facial skeleton intact without bony stepoffs Salivary Glands: No mass or tenderness Facial Strength: Facial motility symmetric and full bilaterally ENT Pinna: External ear intact and fully developed External canal: Cerumen impaction, removed. Canal is patent with intact skin following cerumen removal Tympanic Membrane: Clear and mobile External Nose: No scar or anatomic deformity Internal Nose: Septum intact and deviated to the left. (+) edema, no polyps, mild rhinorrhea Lips, Teeth, and gums: Mucosa and teeth intact and viable TMJ: No pain to palpation with full mobility Oral cavity/oropharynx: No erythema or exudate, no lesions present Nasopharynx: no masses or lesions, clear secretions and post-nasal drainage  Larynx: see procedure note  Bilateral vocal folds were mobile without lesions, minimal mucus likely from postnasal drainage, severe postcricoid edema and pachydermia Neck Neck and Trachea: Midline trachea without mass or lesion Thyroid: No mass or nodularity Lymphatics: No lymphadenopathy  Procedure: Procedure: Cerumen Removal, Bilateral (CPT [id number])  Diagnosis: cerumen impaction, bilateral   Informed consent: Timeout performed and informed consent was obtained.  Procedure: Operating microscope was employed to evaluate the ear(s).  Cerumen curette, speculum and suction were employed to clear the cerumen.   Findings: Normal appearing tympanic  membranes without perforations, and external canals are normal after removal of cerumen. No middle ear fluid bilaterally.   Complications: None. Patient tolerated well.    Preoperative diagnosis: Dysphagia to solids  Postoperative diagnosis:   Same + GERD/LPR  Procedure: Flexible fiberoptic laryngoscopy  Surgeon: Ashok Croon, MD  Anesthesia: Topical lidocaine and Afrin Complications: None Condition is stable throughout exam  Indications and consent:  The patient presents to the clinic with Indirect laryngoscopy view was incomplete. Thus it was recommended that they undergo a flexible fiberoptic laryngoscopy. All of the risks, benefits, and potential complications were reviewed with the patient preoperatively and verbal informed consent was obtained.  Procedure: The patient was seated upright in the clinic. Topical lidocaine and Afrin were applied to the nasal cavity. After adequate anesthesia had occurred, I then proceeded to pass the flexible telescope into the nasal cavity. The nasal cavity was patent without rhinorrhea or polyp. The nasopharynx was also patent without mass or lesion. The base of tongue was visualized and was normal. There were no signs of pooling of secretions in the piriform sinuses. The true vocal folds were mobile bilaterally. There were no signs of glottic or supraglottic mucosal lesion or mass. There was moderate/severe interarytenoid pachydermia and post cricoid edema. The telescope was then slowly withdrawn and the patient tolerated the procedure throughout.    Studies Reviewed:CT head  10/06/2017 - done for headache and left periorbital swelling  FINDINGS: Brain:  The ventricles are normal in size and configuration. The frontal sulci and to a lesser extent parietal sulci are rather prominent bilaterally. There is no intracranial mass, hemorrhage, extra-axial fluid collection, or midline shift. Gray-white compartments appear normal. There is no evident acute  infarct.   Vascular: No hyperdense vessel. There is calcification in each carotid siphon region.   Skull: Bony calvarium appears intact.   Sinuses/Orbits: There is mucosal thickening in several ethmoid air cells. Other visualized paranasal sinuses are clear. Orbits appear symmetric bilaterally.   Other: Mastoid air cells are clear. There is debris in the right external auditory canal.   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 Ventricles normal in size and configuration. No intracranial mass or hemorrhage. Gray-white compartments appear normal.   There are foci of arterial vascular calcification. There is mucosal thickening in several ethmoid air cells. There is probable cerumen in the right external auditory canal.      Assessment/Plan: Encounter Diagnoses  Name Primary?   Decreased hearing of both ears Yes   Tinnitus of both ears    Dysphagia, unspecified type    Globus sensation    Post-nasal drainage    Throat discomfort    Gastroesophageal reflux disease without esophagitis    Nasal congestion    Environmental and seasonal allergies    Dysphagia to solids - no concerning sx for aspiration at this time, hx of GERD, currently not treated with anything  - flexible laryngoscopy today with severe postcricoid edema and pachydermia, but no pooling of secretions, bilateral vocal fold movement -Will obtain swallow evaluation with MBS esophagram to better evaluate her symptoms  2.  GERD LPR -I suspect this could be one of the main contributors to her symptoms based on exam today -Will initiate famotidine 20 mg daily and trial of reflux Gourmet -I discussed diet and lifestyle changes to minimize reflux  3.  She also reports history of raspy voice, which we did not discuss in detail today, but I suspect the major cause could be related to significant postnasal drainage and GERD LPR -If symptoms will not improve on management we discussed today we will do videostrobe  when she returns  4.  Cerumen impaction -status post bilateral cerumen removal today - Sweet Oil ear drops for cerumen   5.  Tinnitus and decreased hearing muffled hearing no recent audiogram. Ddx age-related sensorineural hearing loss versus conductive hearing loss in the setting of ETD and nasal congestion - audiogram   6.  Muffled hearing nasal congestion and postnasal drainage -no obvious air-fluid level on exam today no bacterial sinusitis but evidence of postnasal drainage.  Nasopharynx clear without masses or lesions - start Clarinex 5 mg daily and Flonase 2 puffs bilateral nares twice daily  She will return after testing  Thank you for allowing me to participate in the care of this patient. Please do not hesitate to contact me with any questions or concerns.   Ashok Croon, MD Otolaryngology [facility] Phone: [phone] Fax: [phone]    01/20/2023, 9:44 AM

## 2023-01-20 NOTE — Progress Notes (Deleted)
6/22 at work felt pressure in the left ear> right, went to urgent care. Works in [employer]. Placed on prednisone and zyrtec. Helped mostly went away. Several days after stopping steroids right ear started again. Pressure, cracking, popping. Went to PCP back on steroids, placed on claritin; got better. Hurt back and was put on steroids again for the third time, again this helped.   Currently not taking anything other than amlodipine, and flonase.   Today some cracking, some nasal congestion today. No infectious.  Previous dermal allergy test, ragweed, salmon, cottonwood trees, some other trees.    No pain, tonsill adenoids removed when young.   Trouble swallowing rice.

## 2023-01-24 ENCOUNTER — Telehealth (HOSPITAL_COMMUNITY): Payer: Self-pay | Admitting: *Deleted

## 2023-01-24 NOTE — Telephone Encounter (Signed)
Attempted to contact patient to schedule OP MBS. Left VM. RKEEL

## 2023-01-31 ENCOUNTER — Telehealth (HOSPITAL_COMMUNITY): Payer: Self-pay | Admitting: *Deleted

## 2023-01-31 NOTE — Telephone Encounter (Signed)
Attempted to contact patient to schedule OP MBS. Left VM. RKEEL

## 2023-02-07 ENCOUNTER — Telehealth (HOSPITAL_COMMUNITY): Payer: Self-pay | Admitting: *Deleted

## 2023-02-07 NOTE — Telephone Encounter (Signed)
3rd attempt to contact patient to schedule OP MBS. Left VM. RKEEL

## 2023-02-14 ENCOUNTER — Other Ambulatory Visit (HOSPITAL_COMMUNITY): Payer: Self-pay

## 2023-02-14 ENCOUNTER — Other Ambulatory Visit: Payer: Self-pay

## 2023-02-14 ENCOUNTER — Telehealth (HOSPITAL_COMMUNITY): Payer: Self-pay | Admitting: *Deleted

## 2023-02-14 ENCOUNTER — Other Ambulatory Visit: Payer: Self-pay | Admitting: Family Medicine

## 2023-02-14 DIAGNOSIS — I10 Essential (primary) hypertension: Secondary | ICD-10-CM

## 2023-02-14 MED ORDER — AMLODIPINE BESYLATE 5 MG PO TABS
7.5000 mg | ORAL_TABLET | Freq: Every day | ORAL | 0 refills | Status: DC
Start: 2023-02-14 — End: 2023-06-15
  Filled 2023-02-14 – 2023-03-15 (×3): qty 135, 90d supply, fill #0

## 2023-02-14 NOTE — Telephone Encounter (Signed)
Unable to contact patient X3. Will close order at this time. RKEEL

## 2023-02-15 ENCOUNTER — Other Ambulatory Visit (HOSPITAL_COMMUNITY): Payer: Self-pay

## 2023-02-17 ENCOUNTER — Other Ambulatory Visit (HOSPITAL_COMMUNITY): Payer: Self-pay

## 2023-02-23 ENCOUNTER — Ambulatory Visit: Payer: Managed Care, Other (non HMO) | Attending: Otolaryngology | Admitting: Audiologist

## 2023-02-23 DIAGNOSIS — H9312 Tinnitus, left ear: Secondary | ICD-10-CM

## 2023-02-23 DIAGNOSIS — H938X3 Other specified disorders of ear, bilateral: Secondary | ICD-10-CM

## 2023-02-23 DIAGNOSIS — H90A32 Mixed conductive and sensorineural hearing loss, unilateral, left ear with restricted hearing on the contralateral side: Secondary | ICD-10-CM

## 2023-02-23 DIAGNOSIS — H90A11 Conductive hearing loss, unilateral, right ear with restricted hearing on the contralateral side: Secondary | ICD-10-CM

## 2023-02-23 NOTE — Procedures (Signed)
Outpatient Audiology and [facility] [address] [place], [place]  [address] [phone]  AUDIOLOGICAL  EVALUATION  NAME: Peggy Church     DOB:   04-27-1956      MRN: [id number]                                                                                     DATE: 02/23/2023     REFERENT: Shade Flood, MD STATUS: Outpatient DIAGNOSIS: Mixed Loss Left Ear, Conductive Loss Right Ear   History: Peggy Church was seen for an audiological evaluation due to an onset of tinnitus, aural fullness, dizziness, and difficulty hearing.  Several weeks ago Peggy Church had an episode of dizziness the same day that her ears started to feel full.  She had to hold the walls to walk.  The dizziness lasted most of the day but has since stopped.  She now has a loud high-pitched tinnitus in the left ear.  She feels like her hearing is muffled.  The left ear is much worse.  The fullness and hearing loss seems to fluctuate.  She has never had a hearing test before.  The same day that the episode occurred she wore a new safety goggles at work which were very tight on her head.  She feels the tight safety goggles make the fullness and hearing loss worse.  She saw otolaryngology and was prescribed decongestants.  They have helped some.   Evaluation:  Otoscopy showed a clear view of the tympanic membranes, bilaterally Tympanometry results were consistent with shallow middle ear compliance, bilaterally, type As Audiometric testing was completed using Conventional Audiometry techniques with insert earphones and TDH headphones. Test results are consistent with moderate low-frequency mixed hearing loss rising to normal hearing by 8 kHz in the left ear.  Right ear shows moderate conductive hearing loss rising to normal by 1.5 kHz. Speech Recognition Thresholds were obtained at 25 dB HL in the right ear and at 50 dB HL in the left ear. Word Recognition Testing was completed at 65 dB in the right ear and 90 dB in  the left ear with masking, Peggy Church scored 100% in the right ear and 68% in the left ear.  Significant asymmetry in thresholds and word recognition with left ear worse.  Results:  The test results were reviewed with Peggy Church.  See audiogram below.  She was counseled that she needs to see otolaryngology for follow-up, she was counseled that this is not likely due to just wearing safety goggles however she should talk to her medical provider.     Recommendations: 1.   Many has a moderate low-frequency mixed hearing loss, with the left ear significantly worse.  She needs referral back to otolaryngology for follow-up.  She may require imaging due to significant conductive component to hearing loss.  Plan of care to be determined by otolaryngologist.  Recommend audiology follow-up in 3 months to monitor hearing loss for progression and determine plan of care for possible amplification.   34  minutes spent testing and counseling on results.   If you have any questions please feel free to contact me at [phone].  Ammie Ferrier Audiologist, Au.D., CCC-A  02/23/2023  2:28 PM  Cc: Shade Flood, MD

## 2023-02-27 ENCOUNTER — Encounter (INDEPENDENT_AMBULATORY_CARE_PROVIDER_SITE_OTHER): Payer: Self-pay

## 2023-02-27 ENCOUNTER — Telehealth (INDEPENDENT_AMBULATORY_CARE_PROVIDER_SITE_OTHER): Payer: Self-pay | Admitting: Otolaryngology

## 2023-02-27 NOTE — Telephone Encounter (Signed)
Called patient per Dr. Leighton Roach request to make an earlier date/time.  Left vmail msg and sent MyChart msg for patient to call our office to reschedule.

## 2023-03-01 ENCOUNTER — Other Ambulatory Visit (HOSPITAL_BASED_OUTPATIENT_CLINIC_OR_DEPARTMENT_OTHER): Payer: Self-pay

## 2023-03-15 ENCOUNTER — Other Ambulatory Visit: Payer: Self-pay

## 2023-03-15 ENCOUNTER — Other Ambulatory Visit (HOSPITAL_COMMUNITY): Payer: Self-pay

## 2023-04-17 ENCOUNTER — Ambulatory Visit (INDEPENDENT_AMBULATORY_CARE_PROVIDER_SITE_OTHER): Payer: Managed Care, Other (non HMO) | Admitting: Otolaryngology

## 2023-04-19 ENCOUNTER — Other Ambulatory Visit (HOSPITAL_COMMUNITY): Payer: Self-pay

## 2023-05-15 ENCOUNTER — Other Ambulatory Visit (HOSPITAL_COMMUNITY): Payer: Self-pay

## 2023-05-30 ENCOUNTER — Other Ambulatory Visit (HOSPITAL_COMMUNITY): Payer: Self-pay

## 2023-06-14 ENCOUNTER — Ambulatory Visit
Admission: EM | Admit: 2023-06-14 | Discharge: 2023-06-14 | Disposition: A | Discharge status: Home / Self Care | Payer: Managed Care, Other (non HMO) | Attending: Family Medicine | Admitting: Family Medicine

## 2023-06-14 DIAGNOSIS — N3001 Acute cystitis with hematuria: Secondary | ICD-10-CM | POA: Diagnosis present

## 2023-06-14 LAB — POCT URINALYSIS DIP (MANUAL ENTRY)
Bilirubin, UA: NEGATIVE
Glucose, UA: NEGATIVE mg/dL
Ketones, POC UA: NEGATIVE mg/dL
Nitrite, UA: NEGATIVE
Protein Ur, POC: 100 mg/dL — AB
Spec Grav, UA: 1.025 (ref 1.010–1.025)
Urobilinogen, UA: 1 U/dL
pH, UA: 6.5 (ref 5.0–8.0)

## 2023-06-14 MED ORDER — CEPHALEXIN 500 MG PO CAPS: 500.0000 mg | ORAL_CAPSULE | Freq: Two times a day (BID) | ORAL | 0 refills | Status: DC

## 2023-06-14 NOTE — Discharge Instructions (Addendum)

## 2023-06-14 NOTE — ED Provider Notes (Signed)
 [facility]  Note:  This document was prepared using Conservation officer, historic buildings and may include unintentional dictation errors.  MRN: [id number] DOB: 1956-01-11  Subjective:   Peggy Church is a 68 y.o. female presenting for 1 day history of acute onset recurrent dysuria, urinary frequency and urgency, hematuria.  Patient has been trying to hydrate more consistently but admits that she had a few soda waters this past weekend and thereafter her symptoms started.  No vaginal discharge.  No current facility-administered medications for this encounter.  Current Outpatient Medications:    amLODipine  (NORVASC ) 5 MG tablet, Take 1 & 1/2 tablets (7.5 mg total) by mouth daily., Disp: 135 tablet, Rfl: 0   amLODipine  (NORVASC ) 5 MG tablet, Take 1.5 tablets (7.5 mg total) by mouth daily., Disp: 135 tablet, Rfl: 0   desloratadine  (CLARINEX ) 5 MG tablet, Take 1 tablet (5 mg total) by mouth daily., Disp: 90 tablet, Rfl: 3   famotidine  (PEPCID ) 20 MG tablet, Take 1 tablet (20 mg total) by mouth 2 (two) times daily., Disp: 30 tablet, Rfl: 1   fluticasone  (FLONASE ) 50 MCG/ACT nasal spray, Place 2 sprays into both nostrils daily., Disp: 16 g, Rfl: 6   Multiple Vitamin (MULTI-VITAMINS) TABS, Take by mouth daily., Disp: , Rfl:    potassium chloride  SA (KLOR-CON ) 20 MEQ tablet, TAKE 1 TABLET BY MOUTH TWICE DAILY (NEED TO SCHEDULE 6 MONTH APPT IN MAY), Disp: 180 tablet, Rfl: 0   Allergies  Allergen Reactions   Benicar  [Olmesartan ]     Smells and senses things that aren't really there.   Lisinopril     cough   Statins Other (See Comments)    Myalgia, muscle weakness - lipitor.    Sulfa Drugs Cross Reactors     Past Medical History:  Diagnosis Date   Allergy    Hypertension    Migraine      Past Surgical History:  Procedure Laterality Date   BLADDER SURGERY  1963   stretched   DENTAL SURGERY     ECTOPIC PREGNANCY SURGERY     TONSILLECTOMY AND ADENOIDECTOMY      TUBAL LIGATION      Family History  Problem Relation Age of Onset   Hyperlipidemia Mother    Hypertension Mother    Stroke Mother    Heart disease Mother    Cancer Father        prostate   Hyperlipidemia Father    Hypertension Father    Cancer Brother     Social History   Tobacco Use   Smoking status: Former    Current packs/day: 0.00    Types: Cigarettes    Quit date: 07/18/1991    Years since quitting: 31.9   Smokeless tobacco: Never  Vaping Use   Vaping status: Never Used  Substance Use Topics   Alcohol use: Yes    Alcohol/week: 0.0 standard drinks of alcohol    Comment: rarely   Drug use: No    ROS   Objective:   Vitals: BP (!) 141/73 (BP Location: Left Arm)   Pulse 88   Temp 98.9 F (37.2 C) (Oral)   Resp 16   SpO2 98%   Physical Exam Constitutional:      General: She is not in acute distress.    Appearance: Normal appearance. She is well-developed. She is not ill-appearing, toxic-appearing or diaphoretic.  HENT:     Head: Normocephalic and atraumatic.     Nose: Nose normal.  Mouth/Throat:     Mouth: Mucous membranes are moist.  Eyes:     General: No scleral icterus.       Right eye: No discharge.        Left eye: No discharge.     Extraocular Movements: Extraocular movements intact.     Conjunctiva/sclera: Conjunctivae normal.  Cardiovascular:     Rate and Rhythm: Normal rate.  Pulmonary:     Effort: Pulmonary effort is normal.  Abdominal:     General: Bowel sounds are normal. There is no distension.     Palpations: Abdomen is soft. There is no mass.     Tenderness: There is no abdominal tenderness. There is no right CVA tenderness, left CVA tenderness, guarding or rebound.  Skin:    General: Skin is warm and dry.  Neurological:     General: No focal deficit present.     Mental Status: She is alert and oriented to person, place, and time.  Psychiatric:        Mood and Affect: Mood normal.        Behavior: Behavior normal.         Thought Content: Thought content normal.        Judgment: Judgment normal.     Results for orders placed or performed during the hospital encounter of 06/14/23 (from the past 24 hours)  POCT urinalysis dipstick     Status: Abnormal   Collection Time: 06/14/23 10:02 AM  Result Value Ref Range   Color, UA straw (A) yellow   Clarity, UA turbid (A) clear   Glucose, UA negative negative mg/dL   Bilirubin, UA negative negative   Ketones, POC UA negative negative mg/dL   Spec Grav, UA 8.974 8.989 - 1.025   Blood, UA large (A) negative   pH, UA 6.5 5.0 - 8.0   Protein Ur, POC =100 (A) negative mg/dL   Urobilinogen, UA 1.0 0.2 or 1.0 E.U./dL   Nitrite, UA Negative Negative   Leukocytes, UA Small (1+) (A) Negative    Assessment and Plan :   PDMP not reviewed this encounter.  1. Acute cystitis with hematuria    Start cephalexin  to cover for acute cystitis, urine culture pending.  Recommended aggressive hydration, limiting urinary irritants. Counseled patient on potential for adverse effects with medications prescribed/recommended today, ER and return-to-clinic precautions discussed, patient verbalized understanding.    Christopher Savannah, PA-C 06/14/23 1350

## 2023-06-14 NOTE — ED Triage Notes (Signed)
 Pt c/o dysuria, hematuria and freq started yesterday-NAD-steady gait

## 2023-06-15 ENCOUNTER — Other Ambulatory Visit (HOSPITAL_COMMUNITY): Payer: Self-pay

## 2023-06-15 ENCOUNTER — Other Ambulatory Visit: Payer: Self-pay | Admitting: Family Medicine

## 2023-06-15 DIAGNOSIS — I10 Essential (primary) hypertension: Secondary | ICD-10-CM

## 2023-06-15 MED ORDER — AMLODIPINE BESYLATE 5 MG PO TABS
7.5000 mg | ORAL_TABLET | Freq: Every day | ORAL | 0 refills | Status: DC
Start: 2023-06-15 — End: 2023-09-14
  Filled 2023-06-15: qty 45, 30d supply, fill #0
  Filled 2023-07-17: qty 45, 30d supply, fill #1
  Filled 2023-08-16: qty 45, 30d supply, fill #2

## 2023-06-16 ENCOUNTER — Other Ambulatory Visit: Payer: Self-pay

## 2023-06-16 LAB — URINE CULTURE: Culture: 50000 — AB

## 2023-06-23 ENCOUNTER — Ambulatory Visit: Payer: Managed Care, Other (non HMO) | Admitting: Family Medicine

## 2023-07-17 ENCOUNTER — Other Ambulatory Visit (HOSPITAL_COMMUNITY): Payer: Self-pay

## 2023-08-16 ENCOUNTER — Other Ambulatory Visit (HOSPITAL_COMMUNITY): Payer: Self-pay

## 2023-08-28 ENCOUNTER — Other Ambulatory Visit (HOSPITAL_COMMUNITY): Payer: Self-pay

## 2023-09-14 ENCOUNTER — Other Ambulatory Visit: Payer: Self-pay

## 2023-09-14 ENCOUNTER — Other Ambulatory Visit: Payer: Self-pay | Admitting: Family Medicine

## 2023-09-14 ENCOUNTER — Other Ambulatory Visit (HOSPITAL_COMMUNITY): Payer: Self-pay

## 2023-09-14 DIAGNOSIS — I10 Essential (primary) hypertension: Secondary | ICD-10-CM

## 2023-09-14 MED ORDER — AMLODIPINE BESYLATE 5 MG PO TABS
7.5000 mg | ORAL_TABLET | Freq: Every day | ORAL | 0 refills | Status: DC
Start: 2023-09-14 — End: 2024-03-07
  Filled 2023-09-14: qty 45, 30d supply, fill #0
  Filled 2023-10-11: qty 45, 30d supply, fill #1
  Filled 2024-02-13: qty 45, 30d supply, fill #2

## 2023-10-11 ENCOUNTER — Other Ambulatory Visit: Payer: Self-pay

## 2023-10-11 ENCOUNTER — Other Ambulatory Visit (HOSPITAL_COMMUNITY): Payer: Self-pay

## 2023-10-12 ENCOUNTER — Other Ambulatory Visit: Payer: Self-pay

## 2023-10-12 ENCOUNTER — Other Ambulatory Visit (HOSPITAL_COMMUNITY): Payer: Self-pay

## 2023-10-13 ENCOUNTER — Other Ambulatory Visit: Payer: Self-pay

## 2023-10-13 ENCOUNTER — Other Ambulatory Visit (HOSPITAL_COMMUNITY): Payer: Self-pay

## 2023-11-09 ENCOUNTER — Other Ambulatory Visit (HOSPITAL_COMMUNITY): Payer: Self-pay

## 2023-11-16 ENCOUNTER — Other Ambulatory Visit (HOSPITAL_BASED_OUTPATIENT_CLINIC_OR_DEPARTMENT_OTHER): Payer: Self-pay

## 2023-11-16 ENCOUNTER — Ambulatory Visit (INDEPENDENT_AMBULATORY_CARE_PROVIDER_SITE_OTHER): Admitting: Family Medicine

## 2023-11-16 ENCOUNTER — Other Ambulatory Visit: Payer: Self-pay

## 2023-11-16 ENCOUNTER — Encounter: Payer: Self-pay | Admitting: Family Medicine

## 2023-11-16 VITALS — BP 136/74 | HR 69 | Temp 98.7°F | Resp 14 | Ht 66.0 in | Wt 180.8 lb

## 2023-11-16 DIAGNOSIS — H1033 Unspecified acute conjunctivitis, bilateral: Secondary | ICD-10-CM | POA: Diagnosis not present

## 2023-11-16 DIAGNOSIS — J3489 Other specified disorders of nose and nasal sinuses: Secondary | ICD-10-CM

## 2023-11-16 MED ORDER — AZELASTINE HCL 0.05 % OP SOLN: 1.0000 [drp] | Freq: Two times a day (BID) | OPHTHALMIC | 1 refills | Status: DC

## 2023-11-16 MED ORDER — AZELASTINE HCL 0.05 % OP SOLN
1.0000 [drp] | Freq: Two times a day (BID) | OPHTHALMIC | 1 refills | Status: AC
  Filled 2023-11-16 – 2023-11-17 (×3): qty 6, 30d supply, fill #0

## 2023-11-16 MED ORDER — CIPROFLOXACIN HCL 0.3 % OP SOLN
OPHTHALMIC | 0 refills | Status: AC
Start: 2023-11-16 — End: ?

## 2023-11-16 NOTE — Patient Instructions (Addendum)
 With both eyes being irritated after dust exposure I am suspicious of a more allergic cause of the eye irritation today and less likely infection.  I did send in some medicine called Optivar  and she can use 1 drop twice per day which is an antihistamine for the eyes which should help with some of the irritation.  You can use some artificial eyedrops refresh or Systane throughout the day.  If any discolored discharge from the eyes or increased redness, I will print a prescription for an antibiotic eyedrop but that would be less likely the cause at this time. If not improving through the weekend I would like you to meet with your eye doctor.  Return to the clinic or go to the nearest emergency room if any of your symptoms worsen or new symptoms occur.     For the sinus symptoms, and possible reflux or upper airway/voice symptoms, it may be best to go ahead and proceed with the testing that was recommended by ENT, you can call their office to see if they need to reorder that testing or if you can proceed with the same order from last year.  Also recommend following up with ENT for the persistent ear pressure, sinus issues especially if those are present with consistent use of Flonase  and Claritin.

## 2023-11-16 NOTE — Progress Notes (Signed)
 Subjective:  Patient ID: Peggy Church, female    DOB: [birth year]  Age: 68 y.o. MRN: [id number]  CC:  Chief Complaint  Patient presents with   Eye Drainage    Pt notes both eyes have been sore and drainage presents however Lt eye is worse of the two, tear ducts have been pretty sore, some swelling around the eyes, started on Monday     HPI Peggy Church presents for   Eye irritation: Left upper eyelid felt sore few days ago. Ears feel full, some HA at times with some head pressure/congestion. No rhinorrhea or nasal d/c.  No eye redness. No vision changes.  Some dust with mowing yard over the weekend. No injury, or FB known, but some dust in air and wearing safety glasses.  Some increased watering eyes, some crusting at times during day, dried crust in am, darker crust at times.  On famotidine  - some return of voice change at times. Upset stomach with use of nexium in past. Has been using claritin, flonase  from ENT eval last year. Consistent sinus, ear pressure. Has not followed up. Did not have esophagram or barium swallow.   Tx: replens otc drops past few nights.    History Patient Active Problem List   Diagnosis Date Noted   BMI 31.0-31.9,adult 11/05/2015   HTN (hypertension) 06/26/2012   Past Medical History:  Diagnosis Date   Allergy    Hypertension    Migraine    Past Surgical History:  Procedure Laterality Date   BLADDER SURGERY  1963   stretched   DENTAL SURGERY     ECTOPIC PREGNANCY SURGERY     TONSILLECTOMY AND ADENOIDECTOMY     TUBAL LIGATION     Allergies  Allergen Reactions   Benicar  [Olmesartan ]     Smells and senses things that aren't really there.   Lisinopril     cough   Statins Other (See Comments)    Myalgia, muscle weakness - lipitor.    Sulfa Drugs Cross Reactors    Prior to Admission medications   Medication Sig Start Date End Date Taking? Authorizing Provider  amLODipine  (NORVASC ) 5 MG tablet Take 1 & 1/2 tablets (7.5 mg total) by  mouth daily. 01/04/23  Yes Levora Reyes SAUNDERS, MD  amLODipine  (NORVASC ) 5 MG tablet Take 1.5 tablets (7.5 mg total) by mouth daily. 09/14/23  Yes Levora Reyes SAUNDERS, MD  desloratadine  (CLARINEX ) 5 MG tablet Take 1 tablet (5 mg total) by mouth daily. 01/20/23  Yes Soldatova, Liuba, MD  famotidine  (PEPCID ) 20 MG tablet Take 1 tablet (20 mg total) by mouth 2 (two) times daily. 01/20/23  Yes Soldatova, Liuba, MD  fluticasone  (FLONASE ) 50 MCG/ACT nasal spray Place 2 sprays into both nostrils daily. 01/20/23  Yes Soldatova, Liuba, MD  Multiple Vitamin (MULTI-VITAMINS) TABS Take by mouth daily.   Yes [provider]  potassium chloride  SA (KLOR-CON ) 20 MEQ tablet TAKE 1 TABLET BY MOUTH TWICE DAILY (NEED TO SCHEDULE 6 MONTH APPT IN MAY) 08/02/19  Yes Levora Reyes SAUNDERS, MD  cephALEXin  (KEFLEX ) 500 MG capsule Take 1 capsule (500 mg total) by mouth 2 (two) times daily. Patient not taking: Reported on 11/16/2023 06/14/23   Christopher Savannah, PA-C   Social History   Socioeconomic History   Marital status: Divorced    Spouse name: n/a   Number of children: 3   Years of education: college   Highest education level: Not on file  Occupational History   Occupation: Geophysicist/field seismologist    Comment:  [place]  Tobacco Use   Smoking status: Former    Current packs/day: 0.00    Types: Cigarettes    Quit date: 07/18/1991    Years since quitting: 32.3   Smokeless tobacco: Never  Vaping Use   Vaping status: Never Used  Substance and Sexual Activity   Alcohol use: Yes    Alcohol/week: 0.0 standard drinks of alcohol    Comment: rarely   Drug use: No   Sexual activity: Not on file  Other Topics Concern   Not on file  Social History Narrative   Lives with her daughter and fiance.   Right handed   Lives in 2 story home.   Social Drivers of Corporate investment banker Strain: Not on file  Food Insecurity: Not on file  Transportation Needs: Not on file  Physical Activity: Not on file  Stress: Not on file   Social Connections: Unknown (09/20/2021)   Received from [facility]   Social Network    Social Network: Not on file  Intimate Partner Violence: Unknown (08/12/2021)   Received from [facility]   HITS    Physically Hurt: Not on file    Insult or Talk Down To: Not on file    Threaten Physical Harm: Not on file    Scream or Curse: Not on file    Review of Systems Per HPI.   Objective:   Vitals:   11/16/23 1448  BP: 136/74  Pulse: 69  Resp: 14  Temp: 98.7 F (37.1 C)  TempSrc: Temporal  SpO2: 99%  Weight: 180 lb 12.8 oz (82 kg)  Height: 5' 6 (1.676 m)  Out of meds past week - picking up form pharmacy.    Physical Exam Vitals reviewed.  Constitutional:      General: She is not in acute distress.    Appearance: She is well-developed.  HENT:     Head: Normocephalic and atraumatic.     Right Ear: Hearing, ear canal and external ear normal.     Left Ear: Hearing, tympanic membrane, ear canal and external ear normal.     Ears:     Comments: Moderate cerumen in right canal, unable to completely visualize TM but visualized portion without apparent effusion.    Nose: Nose normal.     Comments: Diffuse frontal and maxillary sinus tenderness.  No nasal discharge.    Mouth/Throat:     Pharynx: No posterior oropharyngeal erythema.  Eyes:     Extraocular Movements: Extraocular movements intact.     Pupils: Pupils are equal, round, and reactive to light.     Comments: Scleral injection bilaterally, lids without apparent swelling or lesions.  Minimal crust at the medial canthus right greater than left, otherwise no discolored discharge noted.  No facial rash/vesicles.  Cardiovascular:     Rate and Rhythm: Normal rate and regular rhythm.     Heart sounds: Normal heart sounds. No murmur heard. Pulmonary:     Effort: Pulmonary effort is normal. No respiratory distress.     Breath sounds: Normal breath sounds. No wheezing or rhonchi.  Skin:    General: Skin is warm and dry.      Findings: No rash.  Neurological:     Mental Status: She is alert and oriented to person, place, and time.  Psychiatric:        Mood and Affect: Mood normal.        Behavior: Behavior normal.     Vision Screening   Right eye  Left eye Both eyes  Without correction     With correction 20/30 20/25 20/25         Assessment & Plan:  Peggy Church is a 68 y.o. female . Acute conjunctivitis of both eyes, unspecified acute conjunctivitis type - Plan: azelastine  (OPTIVAR ) 0.05 % ophthalmic solution, ciprofloxacin  (CILOXAN ) 0.3 % ophthalmic solution  Sinus pressure  Recent bilateral eye irritation, slight scleral injection, likely allergic conjunctivitis after exposure to dust with mowing.  No known foreign body, no lid edema, periorbital erythema or edema.  Trial of Optivar  eyedrops, continue Claritin and Flonase  for chronic congestion but recommended follow-up with ENT if the symptoms have persisted.  Printed prescription for Ciloxan  eyedrops if any increased redness, discolored discharge from eyes that would indicate more likely bacterial conjunctivitis at that time with RTC/ER precautions and recommended follow-up with her ophthalmologist if not improving.  1 month recheck for chronic conditions and can recheck sinus symptoms at that time.  Meds ordered this encounter  Medications   DISCONTD: azelastine  (OPTIVAR ) 0.05 % ophthalmic solution    Sig: Place 1 drop into both eyes 2 (two) times daily.    Dispense:  6 mL    Refill:  1   azelastine  (OPTIVAR ) 0.05 % ophthalmic solution    Sig: Place 1 drop into both eyes 2 (two) times daily.    Dispense:  6 mL    Refill:  1   ciprofloxacin  (CILOXAN ) 0.3 % ophthalmic solution    Sig: Administer 1 drop, every 2 hours, while awake, for 2 days. Then 1 drop, every 4 hours, while awake, for the next 5 days.    Dispense:  5 mL    Refill:  0   Patient Instructions  With both eyes being irritated after dust exposure I am suspicious of a  more allergic cause of the eye irritation today and less likely infection.  I did send in some medicine called Optivar  and she can use 1 drop twice per day which is an antihistamine for the eyes which should help with some of the irritation.  You can use some artificial eyedrops refresh or Systane throughout the day.  If any discolored discharge from the eyes or increased redness, I will print a prescription for an antibiotic eyedrop but that would be less likely the cause at this time. If not improving through the weekend I would like you to meet with your eye doctor.  Return to the clinic or go to the nearest emergency room if any of your symptoms worsen or new symptoms occur.     For the sinus symptoms, and possible reflux or upper airway/voice symptoms, it may be best to go ahead and proceed with the testing that was recommended by ENT, you can call their office to see if they need to reorder that testing or if you can proceed with the same order from last year.  Also recommend following up with ENT for the persistent ear pressure, sinus issues especially if those are present with consistent use of Flonase  and Claritin.     Signed,   Reyes Pines, MD [facility], [facility] 11/16/23 3:52 PM

## 2023-11-17 ENCOUNTER — Other Ambulatory Visit: Payer: Self-pay

## 2023-11-17 ENCOUNTER — Other Ambulatory Visit (HOSPITAL_COMMUNITY): Payer: Self-pay

## 2023-11-20 ENCOUNTER — Telehealth: Payer: Self-pay | Admitting: Urgent Care

## 2023-11-20 ENCOUNTER — Ambulatory Visit
Admission: RE | Admit: 2023-11-20 | Discharge: 2023-11-20 | Disposition: A | Discharge status: Home / Self Care | Source: Ambulatory Visit | Attending: Family Medicine | Admitting: Family Medicine

## 2023-11-20 VITALS — BP 164/91 | HR 75 | Temp 98.3°F | Resp 16

## 2023-11-20 DIAGNOSIS — N3001 Acute cystitis with hematuria: Secondary | ICD-10-CM | POA: Insufficient documentation

## 2023-11-20 DIAGNOSIS — R319 Hematuria, unspecified: Secondary | ICD-10-CM | POA: Diagnosis present

## 2023-11-20 LAB — POCT URINALYSIS DIP (MANUAL ENTRY)
Bilirubin, UA: NEGATIVE
Glucose, UA: NEGATIVE mg/dL
Ketones, POC UA: NEGATIVE mg/dL
Nitrite, UA: POSITIVE — AB
Protein Ur, POC: NEGATIVE mg/dL
Spec Grav, UA: <=1.005 — AB (ref 1.010–1.025)
Urobilinogen, UA: 0.2 U/dL
pH, UA: 5.5 (ref 5.0–8.0)

## 2023-11-20 MED ORDER — CEPHALEXIN 500 MG PO CAPS: 500.0000 mg | ORAL_CAPSULE | Freq: Two times a day (BID) | ORAL | 0 refills | Status: DC

## 2023-11-20 MED ORDER — FLUCONAZOLE 150 MG PO TABS: 150.0000 mg | ORAL_TABLET | ORAL | 0 refills | Status: DC

## 2023-11-20 NOTE — ED Provider Notes (Addendum)
 [facility]  Note:  This document was prepared using Conservation officer, historic buildings and may include unintentional dictation errors.  MRN: [id number] DOB: 1956/02/23  Subjective:   Peggy Church is a 68 y.o. female presenting for 1 day history of acute onset dysuria, urinary frequency, hematuria, vaginal burning. Has a history of UTIs, occasional yeast infection. Denies fever, n/v, abdominal pain, pelvic pain, rashes, vaginal discharge.  Has a history of bladder surgery, remote surgery as a child. Hydrates well with water. Avoids urinary irritants.   No current facility-administered medications for this encounter.  Current Outpatient Medications:    amLODipine  (NORVASC ) 5 MG tablet, Take 1 & 1/2 tablets (7.5 mg total) by mouth daily., Disp: 135 tablet, Rfl: 0   amLODipine  (NORVASC ) 5 MG tablet, Take 1.5 tablets (7.5 mg total) by mouth daily., Disp: 135 tablet, Rfl: 0   azelastine  (OPTIVAR ) 0.05 % ophthalmic solution, Place 1 drop into both eyes 2 (two) times daily., Disp: 6 mL, Rfl: 1   cephALEXin  (KEFLEX ) 500 MG capsule, Take 1 capsule (500 mg total) by mouth 2 (two) times daily. (Patient not taking: Reported on 11/16/2023), Disp: 10 capsule, Rfl: 0   ciprofloxacin  (CILOXAN ) 0.3 % ophthalmic solution, Administer 1 drop, every 2 hours, while awake, for 2 days. Then 1 drop, every 4 hours, while awake, for the next 5 days., Disp: 5 mL, Rfl: 0   desloratadine  (CLARINEX ) 5 MG tablet, Take 1 tablet (5 mg total) by mouth daily., Disp: 90 tablet, Rfl: 3   famotidine  (PEPCID ) 20 MG tablet, Take 1 tablet (20 mg total) by mouth 2 (two) times daily., Disp: 30 tablet, Rfl: 1   fluticasone  (FLONASE ) 50 MCG/ACT nasal spray, Place 2 sprays into both nostrils daily., Disp: 16 g, Rfl: 6   Multiple Vitamin (MULTI-VITAMINS) TABS, Take by mouth daily., Disp: , Rfl:    potassium chloride  SA (KLOR-CON ) 20 MEQ tablet, TAKE 1 TABLET BY MOUTH TWICE DAILY (NEED TO SCHEDULE 6 MONTH APPT IN  MAY), Disp: 180 tablet, Rfl: 0   Allergies  Allergen Reactions   Benicar  [Olmesartan ]     Smells and senses things that aren't really there.   Lisinopril     cough   Statins Other (See Comments)    Myalgia, muscle weakness - lipitor.    Sulfa Drugs Cross Reactors     Past Medical History:  Diagnosis Date   Allergy    Hypertension    Migraine      Past Surgical History:  Procedure Laterality Date   BLADDER SURGERY  1963   stretched   DENTAL SURGERY     ECTOPIC PREGNANCY SURGERY     TONSILLECTOMY AND ADENOIDECTOMY     TUBAL LIGATION      Family History  Problem Relation Age of Onset   Hyperlipidemia Mother    Hypertension Mother    Stroke Mother    Heart disease Mother    Cancer Father        prostate   Hyperlipidemia Father    Hypertension Father    Cancer Brother     Social History   Tobacco Use   Smoking status: Former    Current packs/day: 0.00    Types: Cigarettes    Quit date: 07/18/1991    Years since quitting: 32.3   Smokeless tobacco: Never  Vaping Use   Vaping status: Never Used  Substance Use Topics   Alcohol use: Yes    Alcohol/week: 0.0 standard drinks of alcohol    Comment:  rarely   Drug use: No    ROS   Objective:   Vitals: BP (!) 164/91 (BP Location: Right Arm)   Pulse 75   Temp 98.3 F (36.8 C) (Oral)   Resp 16   SpO2 96%   Physical Exam Constitutional:      General: She is not in acute distress.    Appearance: Normal appearance. She is well-developed. She is not ill-appearing, toxic-appearing or diaphoretic.  HENT:     Head: Normocephalic and atraumatic.     Nose: Nose normal.     Mouth/Throat:     Mouth: Mucous membranes are moist.     Pharynx: Oropharynx is clear.  Eyes:     General: No scleral icterus.       Right eye: No discharge.        Left eye: No discharge.     Extraocular Movements: Extraocular movements intact.     Conjunctiva/sclera: Conjunctivae normal.  Cardiovascular:     Rate and Rhythm: Normal  rate.  Pulmonary:     Effort: Pulmonary effort is normal.  Abdominal:     General: Bowel sounds are normal. There is no distension.     Palpations: Abdomen is soft. There is no mass.     Tenderness: There is no abdominal tenderness. There is no right CVA tenderness, left CVA tenderness, guarding or rebound.  Skin:    General: Skin is warm and dry.  Neurological:     General: No focal deficit present.     Mental Status: She is alert and oriented to person, place, and time.  Psychiatric:        Mood and Affect: Mood normal.        Behavior: Behavior normal.        Thought Content: Thought content normal.        Judgment: Judgment normal.     Results for orders placed or performed during the hospital encounter of 11/20/23 (from the past 24 hours)  POCT urinalysis dipstick     Status: Abnormal   Collection Time: 11/20/23  9:36 AM  Result Value Ref Range   Color, UA orange (A) yellow   Clarity, UA clear clear   Glucose, UA negative negative mg/dL   Bilirubin, UA negative negative   Ketones, POC UA negative negative mg/dL   Spec Grav, UA <=8.994 (A) 1.010 - 1.025   Blood, UA large (A) negative   pH, UA 5.5 5.0 - 8.0   Protein Ur, POC negative negative mg/dL   Urobilinogen, UA 0.2 0.2 or 1.0 E.U./dL   Nitrite, UA Positive (A) Negative   Leukocytes, UA Small (1+) (A) Negative    Assessment and Plan :   PDMP not reviewed this encounter.  1. Hematuria, unspecified type    Start cephalexin  to cover for acute cystitis, urine culture pending.  Recommended aggressive hydration, limiting urinary irritants. Fluconazole  for antibiotic associated yeast infection. Counseled patient on potential for adverse effects with medications prescribed/recommended today, ER and return-to-clinic precautions discussed, patient verbalized understanding.    Christopher Savannah, [place] 11/20/23 [phone]

## 2023-11-20 NOTE — Discharge Instructions (Signed)

## 2023-11-20 NOTE — Telephone Encounter (Signed)
 Encounter created in error

## 2023-11-20 NOTE — ED Triage Notes (Signed)
 Pt c/o urinary freq and hematuria started yesterday-NAD-steady gait

## 2023-11-22 ENCOUNTER — Ambulatory Visit (HOSPITAL_COMMUNITY): Payer: Self-pay

## 2023-11-22 LAB — URINE CULTURE: Culture: 10000 — AB

## 2023-11-29 ENCOUNTER — Ambulatory Visit
Admission: EM | Admit: 2023-11-29 | Discharge: 2023-11-29 | Disposition: A | Discharge status: Home / Self Care | Attending: Family Medicine | Admitting: Family Medicine

## 2023-11-29 DIAGNOSIS — R300 Dysuria: Secondary | ICD-10-CM | POA: Diagnosis not present

## 2023-11-29 DIAGNOSIS — R3915 Urgency of urination: Secondary | ICD-10-CM | POA: Diagnosis present

## 2023-11-29 DIAGNOSIS — N3001 Acute cystitis with hematuria: Secondary | ICD-10-CM | POA: Diagnosis not present

## 2023-11-29 MED ORDER — FLUCONAZOLE 150 MG PO TABS: 150.0000 mg | ORAL_TABLET | ORAL | 0 refills | Status: AC

## 2023-11-29 MED ORDER — CIPROFLOXACIN HCL 500 MG PO TABS: 500.0000 mg | ORAL_TABLET | Freq: Two times a day (BID) | ORAL | 0 refills | Status: AC

## 2023-11-29 NOTE — Discharge Instructions (Signed)
 We will trial ciprofloxacin  as an alternative antibiotic for an ongoing suspected UTI. We will run the urine culture again and also use fluconazole  for prevention of yeast infection.

## 2023-11-29 NOTE — ED Triage Notes (Signed)
 Pt c/o urgency and a little bit of pain (lower abd pain)-recent UTI with completions of abx-NAD-steady gait

## 2023-11-29 NOTE — ED Provider Notes (Signed)
 [facility]  Note:  This document was prepared using Conservation officer, historic buildings and may include unintentional dictation errors.  MRN: [id number] DOB: [birth year]  Subjective:   Peggy Church is a 68 y.o. female presenting for recheck on persistent urinary symptoms.  Still has dysuria, urinary frequency and urgency.  She has completed the cephalexin  course.  Urine culture confirmed that this should have worked.  Patient would like to be considered for a different antibiotic course given her ongoing symptoms.  No fever, nausea, vomiting, frank hematuria.  Patient did also complete fluconazole .  No current facility-administered medications for this encounter.  Current Outpatient Medications:    amLODipine  (NORVASC ) 5 MG tablet, Take 1 & 1/2 tablets (7.5 mg total) by mouth daily., Disp: 135 tablet, Rfl: 0   amLODipine  (NORVASC ) 5 MG tablet, Take 1.5 tablets (7.5 mg total) by mouth daily., Disp: 135 tablet, Rfl: 0   azelastine  (OPTIVAR ) 0.05 % ophthalmic solution, Place 1 drop into both eyes 2 (two) times daily., Disp: 6 mL, Rfl: 1   cephALEXin  (KEFLEX ) 500 MG capsule, Take 1 capsule (500 mg total) by mouth 2 (two) times daily., Disp: 10 capsule, Rfl: 0   ciprofloxacin  (CILOXAN ) 0.3 % ophthalmic solution, Administer 1 drop, every 2 hours, while awake, for 2 days. Then 1 drop, every 4 hours, while awake, for the next 5 days., Disp: 5 mL, Rfl: 0   desloratadine  (CLARINEX ) 5 MG tablet, Take 1 tablet (5 mg total) by mouth daily., Disp: 90 tablet, Rfl: 3   famotidine  (PEPCID ) 20 MG tablet, Take 1 tablet (20 mg total) by mouth 2 (two) times daily., Disp: 30 tablet, Rfl: 1   fluconazole  (DIFLUCAN ) 150 MG tablet, Take 1 tablet (150 mg total) by mouth once a week., Disp: 2 tablet, Rfl: 0   fluticasone  (FLONASE ) 50 MCG/ACT nasal spray, Place 2 sprays into both nostrils daily., Disp: 16 g, Rfl: 6   Multiple Vitamin (MULTI-VITAMINS) TABS, Take by mouth daily., Disp: , Rfl:     potassium chloride  SA (KLOR-CON ) 20 MEQ tablet, TAKE 1 TABLET BY MOUTH TWICE DAILY (NEED TO SCHEDULE 6 MONTH APPT IN MAY), Disp: 180 tablet, Rfl: 0   Allergies  Allergen Reactions   Benicar  [Olmesartan ]     Smells and senses things that aren't really there.   Lisinopril     cough   Statins Other (See Comments)    Myalgia, muscle weakness - lipitor.    Sulfa Drugs Cross Reactors     Past Medical History:  Diagnosis Date   Allergy    Hypertension    Migraine      Past Surgical History:  Procedure Laterality Date   BLADDER SURGERY  1963   stretched   DENTAL SURGERY     ECTOPIC PREGNANCY SURGERY     TONSILLECTOMY AND ADENOIDECTOMY     TUBAL LIGATION      Family History  Problem Relation Age of Onset   Hyperlipidemia Mother    Hypertension Mother    Stroke Mother    Heart disease Mother    Cancer Father        prostate   Hyperlipidemia Father    Hypertension Father    Cancer Brother     Social History   Tobacco Use   Smoking status: Former    Current packs/day: 0.00    Types: Cigarettes    Quit date: 07/18/1991    Years since quitting: 32.3   Smokeless tobacco: Never  Vaping Use   Vaping  status: Never Used  Substance Use Topics   Alcohol use: Yes    Alcohol/week: 0.0 standard drinks of alcohol    Comment: rarely   Drug use: No    ROS   Objective:   Vitals: BP (!) 168/84 (BP Location: Right Arm)   Pulse 71   Temp 97.6 F (36.4 C) (Oral)   Resp 16   SpO2 97%   Physical Exam Constitutional:      General: She is not in acute distress.    Appearance: Normal appearance. She is well-developed. She is not ill-appearing, toxic-appearing or diaphoretic.  HENT:     Head: Normocephalic and atraumatic.     Nose: Nose normal.     Mouth/Throat:     Mouth: Mucous membranes are moist.     Pharynx: Oropharynx is clear.  Eyes:     General: No scleral icterus.       Right eye: No discharge.        Left eye: No discharge.     Extraocular Movements:  Extraocular movements intact.     Conjunctiva/sclera: Conjunctivae normal.  Cardiovascular:     Rate and Rhythm: Normal rate.  Pulmonary:     Effort: Pulmonary effort is normal.  Abdominal:     General: Bowel sounds are normal. There is no distension.     Palpations: Abdomen is soft. There is no mass.     Tenderness: There is no abdominal tenderness. There is no right CVA tenderness, left CVA tenderness, guarding or rebound.  Skin:    General: Skin is warm and dry.  Neurological:     General: No focal deficit present.     Mental Status: She is alert and oriented to person, place, and time.  Psychiatric:        Mood and Affect: Mood normal.        Behavior: Behavior normal.        Thought Content: Thought content normal.        Judgment: Judgment normal.     Assessment and Plan :   PDMP not reviewed this encounter.  1. Acute cystitis with hematuria   2. Dysuria   3. Urinary urgency    Suspect ongoing cystitis and therefore probably recommend ciprofloxacin  as an alternative antibiotic course.  Recommend repeating the fluconazole  dosing to stave off antibiotic associated yeast infection for the patient.  Urine culture pending.  Counseled patient on potential for adverse effects with medications prescribed/recommended today, ER and return-to-clinic precautions discussed, patient verbalized understanding.    Christopher Savannah, [place] 11/29/23 [id number]

## 2023-11-30 ENCOUNTER — Ambulatory Visit (HOSPITAL_COMMUNITY)

## 2023-11-30 LAB — URINE CULTURE: Culture: NO GROWTH

## 2023-12-04 ENCOUNTER — Ambulatory Visit (HOSPITAL_COMMUNITY): Payer: Self-pay

## 2024-02-02 ENCOUNTER — Ambulatory Visit (INDEPENDENT_AMBULATORY_CARE_PROVIDER_SITE_OTHER): Admitting: Family Medicine

## 2024-02-02 ENCOUNTER — Encounter: Payer: Self-pay | Admitting: Family Medicine

## 2024-02-02 VITALS — BP 120/72 | HR 79 | Temp 98.9°F | Ht 66.0 in | Wt 178.4 lb

## 2024-02-02 DIAGNOSIS — L720 Epidermal cyst: Secondary | ICD-10-CM | POA: Diagnosis not present

## 2024-02-02 NOTE — Progress Notes (Signed)
 Established Patient Office Visit   Subjective  Patient ID: Peggy Church, female    DOB: 1955/10/01  Age: 68 y.o. MRN: [id number]  Chief Complaint  Patient presents with   Acute Visit    Patient came in today, for cyst on the left side of her nose, patient has had 2 removed, sore, started 2 days ago     Pt is a 68 yo female followed by Peggy Pines, MD and seen for acute on chronic concern.   Pt with a h/o cyst on L side of nose that required drainage 38 yrs ago.  Pt states the area started to become slightly sore last night.  Concerned it will get larger and more painful.  Pt wears glasses and safety glasses at work that press against the area.  Pt denies facial pain/pressure, facial edema, ear pain/pressure, dental pain, recent illness, fever, or chills, vision changes.    Patient Active Problem List   Diagnosis Date Noted   BMI 31.0-31.9,adult 11/05/2015   HTN (hypertension) 06/26/2012   Past Medical History:  Diagnosis Date   Allergy    Hypertension    Migraine    Past Surgical History:  Procedure Laterality Date   BLADDER SURGERY  1963   stretched   DENTAL SURGERY     ECTOPIC PREGNANCY SURGERY     TONSILLECTOMY AND ADENOIDECTOMY     TUBAL LIGATION     Social History   Tobacco Use   Smoking status: Former    Current packs/day: 0.00    Types: Cigarettes    Quit date: 07/18/1991    Years since quitting: 32.5   Smokeless tobacco: Never  Vaping Use   Vaping status: Never Used  Substance Use Topics   Alcohol use: Yes    Alcohol/week: 0.0 standard drinks of alcohol    Comment: rarely   Drug use: No   Family History  Problem Relation Age of Onset   Hyperlipidemia Mother    Hypertension Mother    Stroke Mother    Heart disease Mother    Cancer Father        prostate   Hyperlipidemia Father    Hypertension Father    Cancer Brother    Allergies  Allergen Reactions   Benicar  [Olmesartan ]     Smells and senses things that aren't really there.    Lisinopril     cough   Statins Other (See Comments)    Myalgia, muscle weakness - lipitor.    Sulfa Drugs Cross Reactors     ROS Negative unless stated above    Objective:     BP 120/72 (BP Location: Left Arm, Patient Position: Sitting, Cuff Size: Large)   Pulse 79   Temp 98.9 F (37.2 C) (Oral)   Ht 5' 6 (1.676 m)   Wt 178 lb 6.4 oz (80.9 kg)   SpO2 98%   BMI 28.79 kg/m  BP Readings from Last 3 Encounters:  02/02/24 120/72  11/29/23 (!) 168/84  11/20/23 (!) 164/91   Wt Readings from Last 3 Encounters:  02/02/24 178 lb 6.4 oz (80.9 kg)  11/16/23 180 lb 12.8 oz (82 kg)  01/20/23 192 lb 3.2 oz (87.2 kg)      Physical Exam Constitutional:      Appearance: Normal appearance.  HENT:     Head: Normocephalic and atraumatic.     Nose: Nose normal.  Eyes:     Pupils: Pupils are equal, round, and reactive to light.  Cardiovascular:  Rate and Rhythm: Normal rate.  Pulmonary:     Effort: Pulmonary effort is normal.  Skin:    General: Skin is warm and dry.     Comments: A  5 mm linear, slightly raised, firm area at L upper naso-labial fold. No erythema, induration, increased warmth, or drainage noted.  Neurological:     Mental Status: She is alert and oriented to person, place, and time.        12/14/2022    3:46 PM 10/15/2021    8:28 AM 08/23/2021    3:57 PM  Depression screen PHQ 2/9  Decreased Interest 0 0 0  Down, Depressed, Hopeless 0 0 0  PHQ - 2 Score 0 0 0  Altered sleeping 0 1   Tired, decreased energy 3 0   Change in appetite 2 0   Feeling bad or failure about yourself  0 0   Trouble concentrating 1 1   Moving slowly or fidgety/restless 0 0   Suicidal thoughts 0 0   PHQ-9 Score 6 2   Difficult doing work/chores  Not difficult at all       12/14/2022    3:46 PM 12/16/2020   10:43 AM  GAD 7 : Generalized Anxiety Score  Nervous, Anxious, on Edge 1 1  Control/stop worrying 0 0  Worry too much - different things 0 0  Trouble relaxing 0 0   Restless 0 0  Easily annoyed or irritable 0 0  Afraid - awful might happen 0 0  Total GAD 7 Score 1 1     No results found for any visits on 02/02/24.    Assessment & Plan:   Epidermoid cyst of face -     Ambulatory referral to Dermatology   Recurrence of cyst of L nasolabial fold previously requiring I&D.  Referral to Derm placed.  Discussed trying to avoid further irritation of the area by wearing a different style of safety glasses/repositioning glasses.  Return if symptoms worsen or fail to improve.   Peggy JONELLE Single, MD

## 2024-02-13 ENCOUNTER — Other Ambulatory Visit: Payer: Self-pay

## 2024-02-13 ENCOUNTER — Other Ambulatory Visit (HOSPITAL_COMMUNITY): Payer: Self-pay

## 2024-03-06 ENCOUNTER — Ambulatory Visit: Admitting: Family Medicine

## 2024-03-07 ENCOUNTER — Other Ambulatory Visit (HOSPITAL_COMMUNITY): Payer: Self-pay

## 2024-03-07 ENCOUNTER — Other Ambulatory Visit: Payer: Self-pay

## 2024-03-07 ENCOUNTER — Other Ambulatory Visit: Payer: Self-pay | Admitting: Family Medicine

## 2024-03-07 DIAGNOSIS — I10 Essential (primary) hypertension: Secondary | ICD-10-CM

## 2024-03-07 MED ORDER — AMLODIPINE BESYLATE 5 MG PO TABS
7.5000 mg | ORAL_TABLET | Freq: Every day | ORAL | 0 refills | Status: AC
  Filled 2024-03-07: qty 45, 30d supply, fill #0
  Filled 2024-04-08: qty 45, 30d supply, fill #1
  Filled 2024-05-20: qty 45, 30d supply, fill #2

## 2024-04-08 ENCOUNTER — Other Ambulatory Visit (HOSPITAL_COMMUNITY): Payer: Self-pay

## 2024-05-20 ENCOUNTER — Other Ambulatory Visit (HOSPITAL_COMMUNITY): Payer: Self-pay
